# Patient Record
Sex: Female | Born: 1950 | Race: White | Hispanic: No | Smoking: Former smoker
Health system: Southern US, Community
[De-identification: ages and names within clinical notes are randomized; demographics above are authoritative.]

## PROBLEM LIST (undated history)

## (undated) ENCOUNTER — Ambulatory Visit (HOSPITAL_COMMUNITY): Admission: EM | Payer: PPO

## (undated) DIAGNOSIS — E039 Hypothyroidism, unspecified: Secondary | ICD-10-CM

## (undated) DIAGNOSIS — M1990 Unspecified osteoarthritis, unspecified site: Secondary | ICD-10-CM

## (undated) DIAGNOSIS — I2510 Atherosclerotic heart disease of native coronary artery without angina pectoris: Secondary | ICD-10-CM

## (undated) DIAGNOSIS — M65312 Trigger thumb, left thumb: Secondary | ICD-10-CM

## (undated) DIAGNOSIS — I10 Essential (primary) hypertension: Secondary | ICD-10-CM

## (undated) DIAGNOSIS — G4730 Sleep apnea, unspecified: Secondary | ICD-10-CM

## (undated) DIAGNOSIS — I6529 Occlusion and stenosis of unspecified carotid artery: Secondary | ICD-10-CM

## (undated) DIAGNOSIS — E785 Hyperlipidemia, unspecified: Secondary | ICD-10-CM

## (undated) DIAGNOSIS — Z9889 Other specified postprocedural states: Secondary | ICD-10-CM

## (undated) HISTORY — PX: TUBAL LIGATION: SHX77

## (undated) HISTORY — PX: CARDIAC CATHETERIZATION: SHX172

---
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
## 1983-04-22 HISTORY — PX: DILATION AND CURETTAGE OF UTERUS: SHX78

## 1994-04-21 HISTORY — PX: CORONARY ARTERY BYPASS GRAFT: SHX141

## 1998-06-20 ENCOUNTER — Inpatient Hospital Stay (HOSPITAL_COMMUNITY): Admission: EM | Admit: 1998-06-20 | Discharge: 1998-06-21 | Payer: Self-pay | Admitting: Emergency Medicine

## 1998-06-20 ENCOUNTER — Encounter: Payer: Self-pay | Admitting: Emergency Medicine

## 1999-11-29 ENCOUNTER — Encounter: Admission: RE | Admit: 1999-11-29 | Discharge: 1999-11-29 | Payer: Self-pay | Admitting: Internal Medicine

## 1999-11-29 ENCOUNTER — Encounter: Payer: Self-pay | Admitting: Internal Medicine

## 2000-01-30 ENCOUNTER — Ambulatory Visit (HOSPITAL_COMMUNITY): Admission: RE | Admit: 2000-01-30 | Discharge: 2000-01-30 | Payer: Self-pay | Admitting: Interventional Cardiology

## 2000-12-02 ENCOUNTER — Encounter: Admission: RE | Admit: 2000-12-02 | Discharge: 2000-12-02 | Payer: Self-pay | Admitting: Internal Medicine

## 2000-12-02 ENCOUNTER — Encounter: Payer: Self-pay | Admitting: Internal Medicine

## 2002-12-31 ENCOUNTER — Ambulatory Visit (HOSPITAL_BASED_OUTPATIENT_CLINIC_OR_DEPARTMENT_OTHER): Admission: RE | Admit: 2002-12-31 | Discharge: 2002-12-31 | Payer: Self-pay | Admitting: Interventional Cardiology

## 2003-08-03 ENCOUNTER — Encounter: Admission: RE | Admit: 2003-08-03 | Discharge: 2003-08-03 | Payer: Self-pay | Admitting: Internal Medicine

## 2004-08-21 ENCOUNTER — Other Ambulatory Visit: Admission: RE | Admit: 2004-08-21 | Discharge: 2004-08-21 | Payer: Self-pay | Admitting: Internal Medicine

## 2004-10-03 ENCOUNTER — Encounter: Admission: RE | Admit: 2004-10-03 | Discharge: 2004-10-03 | Payer: Self-pay | Admitting: Internal Medicine

## 2005-09-18 ENCOUNTER — Ambulatory Visit: Payer: Self-pay | Admitting: Internal Medicine

## 2005-12-24 ENCOUNTER — Other Ambulatory Visit: Admission: RE | Admit: 2005-12-24 | Discharge: 2005-12-24 | Payer: Self-pay | Admitting: Internal Medicine

## 2006-03-20 ENCOUNTER — Encounter: Admission: RE | Admit: 2006-03-20 | Discharge: 2006-03-20 | Payer: Self-pay | Admitting: Internal Medicine

## 2006-04-21 HISTORY — PX: NASAL SEPTOPLASTY W/ TURBINOPLASTY: SHX2070

## 2006-10-30 ENCOUNTER — Ambulatory Visit: Payer: Self-pay | Admitting: Internal Medicine

## 2006-12-02 ENCOUNTER — Ambulatory Visit: Payer: Self-pay | Admitting: Internal Medicine

## 2007-01-08 ENCOUNTER — Ambulatory Visit (HOSPITAL_COMMUNITY): Admission: RE | Admit: 2007-01-08 | Discharge: 2007-01-08 | Payer: Self-pay | Admitting: Otolaryngology

## 2007-01-29 DIAGNOSIS — J310 Chronic rhinitis: Secondary | ICD-10-CM | POA: Insufficient documentation

## 2007-01-29 DIAGNOSIS — I25708 Atherosclerosis of coronary artery bypass graft(s), unspecified, with other forms of angina pectoris: Secondary | ICD-10-CM | POA: Insufficient documentation

## 2007-01-29 DIAGNOSIS — I10 Essential (primary) hypertension: Secondary | ICD-10-CM | POA: Insufficient documentation

## 2007-01-29 DIAGNOSIS — F17200 Nicotine dependence, unspecified, uncomplicated: Secondary | ICD-10-CM | POA: Insufficient documentation

## 2007-01-29 DIAGNOSIS — E785 Hyperlipidemia, unspecified: Secondary | ICD-10-CM | POA: Insufficient documentation

## 2007-01-29 DIAGNOSIS — G4733 Obstructive sleep apnea (adult) (pediatric): Secondary | ICD-10-CM | POA: Insufficient documentation

## 2007-04-02 ENCOUNTER — Encounter: Payer: Self-pay | Admitting: Internal Medicine

## 2008-01-12 ENCOUNTER — Other Ambulatory Visit: Admission: RE | Admit: 2008-01-12 | Discharge: 2008-01-12 | Payer: Self-pay | Admitting: Internal Medicine

## 2008-10-04 ENCOUNTER — Inpatient Hospital Stay (HOSPITAL_COMMUNITY): Admission: EM | Admit: 2008-10-04 | Discharge: 2008-10-05 | Payer: Self-pay | Admitting: Emergency Medicine

## 2009-01-16 ENCOUNTER — Other Ambulatory Visit: Admission: RE | Admit: 2009-01-16 | Discharge: 2009-01-16 | Payer: Self-pay | Admitting: Internal Medicine

## 2010-01-24 ENCOUNTER — Other Ambulatory Visit: Admission: RE | Admit: 2010-01-24 | Discharge: 2010-01-24 | Payer: Self-pay | Admitting: Internal Medicine

## 2010-05-11 ENCOUNTER — Encounter: Payer: Self-pay | Admitting: Internal Medicine

## 2010-05-12 ENCOUNTER — Encounter: Payer: Self-pay | Admitting: Internal Medicine

## 2010-07-01 ENCOUNTER — Other Ambulatory Visit: Payer: Self-pay | Admitting: Gastroenterology

## 2010-07-29 LAB — CBC
HCT: 44 % (ref 36.0–46.0)
MCHC: 33.5 g/dL (ref 30.0–36.0)
MCHC: 34.8 g/dL (ref 30.0–36.0)
MCV: 87.4 fL (ref 78.0–100.0)
MCV: 87.5 fL (ref 78.0–100.0)
Platelets: 285 10*3/uL (ref 150–400)
RBC: 5.02 MIL/uL (ref 3.87–5.11)
RBC: 5.08 MIL/uL (ref 3.87–5.11)
RDW: 13.5 % (ref 11.5–15.5)
RDW: 13.7 % (ref 11.5–15.5)

## 2010-07-29 LAB — LIPID PANEL
Cholesterol: 154 mg/dL (ref 0–200)
Total CHOL/HDL Ratio: 6.2 RATIO
VLDL: 65 mg/dL — ABNORMAL HIGH (ref 0–40)

## 2010-07-29 LAB — DIFFERENTIAL
Basophils Absolute: 0.1 10*3/uL (ref 0.0–0.1)
Basophils Relative: 1 % (ref 0–1)
Eosinophils Absolute: 0.1 10*3/uL (ref 0.0–0.7)
Eosinophils Relative: 1 % (ref 0–5)
Lymphs Abs: 3.6 10*3/uL (ref 0.7–4.0)
Neutrophils Relative %: 46 % (ref 43–77)

## 2010-07-29 LAB — TSH: TSH: 4.102 u[IU]/mL (ref 0.350–4.500)

## 2010-07-29 LAB — COMPREHENSIVE METABOLIC PANEL
ALT: 26 U/L (ref 0–35)
AST: 26 U/L (ref 0–37)
Alkaline Phosphatase: 91 U/L (ref 39–117)
CO2: 26 mEq/L (ref 19–32)
Chloride: 107 mEq/L (ref 96–112)
GFR calc Af Amer: >60 mL/min (ref >60)
GFR calc non Af Amer: >60 mL/min (ref >60)
Glucose, Bld: 95 mg/dL (ref 70–99)
Potassium: 4.1 mEq/L (ref 3.5–5.1)
Sodium: 142 mEq/L (ref 135–145)
Total Bilirubin: 0.7 mg/dL (ref 0.3–1.2)

## 2010-07-29 LAB — CK TOTAL AND CKMB (NOT AT ARMC): CK, MB: 1.3 ng/mL (ref 0.3–4.0)

## 2010-07-29 LAB — CARDIAC PANEL(CRET KIN+CKTOT+MB+TROPI)
CK, MB: 1.2 ng/mL (ref 0.3–4.0)
Relative Index: INVALID (ref 0.0–2.5)
Relative Index: INVALID (ref 0.0–2.5)
Total CK: 65 U/L (ref 7–177)

## 2010-07-29 LAB — PROTIME-INR: Prothrombin Time: 12.8 seconds (ref 11.6–15.2)

## 2010-09-03 NOTE — Assessment & Plan Note (Signed)
[facility]                             PULMONARY OFFICE NOTE   Kathryn Castillo, Kathryn Castillo                      MRN:          [id number]  DATE:10/30/2006                            DOB:          [birth year]    PULMONARY FOLLOWUP.   PROBLEM:  1. Obstructive sleep apnea.  2. Coronary disease/bypass graft.  3. Tobacco use.   HISTORY:  One year followup.  She continues to smoke now about a pack a  day.  She had been seen by Dr. Willa Rough for allergy evaluation and had skin  testing but understood that significant allergic triggers were not  identified.  She complains her nose drips all the time and feels  congested, with nothing purulent and no significant headache.  She uses  Nasonex, but I think not very regularly, and she finds it only temporary  help.  She remembers having tried Astelin in the past.  She had had  Chantix in the past.  She complains now that her CPAP pressure is not  high enough, noted especially since she got a new machine.  She had had  a titration effort last year and it looks as if that may have  recommended a pressure around 10 but she thinks she has been on 17 in  the past and that she is just not getting enough air pressure now to be  comfortable.  We discussed this.   MEDICATION:  CPAP, atenolol, Vytorin, Baby Aspirin, Fosamax, Caltrate,  Fish Oil.   DRUG INTOLERANCE:  Septra.   OBJECTIVE:  Weight 220 pounds, BP 106/60, pulse 60, room air saturation  99%.  Moderate nasal congestion, turbinate edema, clear secretions, no  visible polyps, sneezing and watery sniffing.  Pharynx looks clear,  there is no postnasal drainage, voice quality is normal.  LUNGS:  Clear without cough or wheeze now.  HEART SOUNDS:  Regular, without murmur.   IMPRESSION:  1. Rhinitis.  2. Obstructive sleep apnea needing pressure elevation, she thinks to      17.  3. Tobacco abuse.   PLAN:  1. Weight loss.  2. Quit smoking.  3. Sample of Veramyst, two  sprays to each nostril daily.  4. Sudafed PE once or twice daily for congestion.  5. She is given nebulizer, Neo-Synephrine and Depo 80.  6. APREA is going to increase her CPAP pressure up to 17, which is      where she thinks it is      supposed to be.  7. Schedule return in 1 month, earlier p.r.n.     Clinton D. Maple Hudson, MD, Tonny Bollman, FACP  Electronically Signed    CDY/MedQ  DD: 10/30/2006  DT: 11/01/2006  Job #: [id number]   cc:   Candyce Churn, M.D.  Lyn Records, M.D.

## 2010-09-03 NOTE — Op Note (Signed)
Kathryn Castillo, Kathryn Castillo               ACCOUNT NO.:  [id number]   MEDICAL RECORD NO.:  [id number]          PATIENT TYPE:  AMB   LOCATION:  SDS                          FACILITY:  [facility]   PHYSICIAN:  Jefry H. Pollyann Kennedy, MD     DATE OF BIRTH:  April 22, 1950   DATE OF PROCEDURE:  01/08/2007  DATE OF DISCHARGE:                               OPERATIVE REPORT   PREOPERATIVE DIAGNOSIS:  1. Obstructive sleep apnea syndrome.  2. Inability to tolerate CPAP.  3. Nasal septal deviation with inferior turbinate hypertrophy.   POSTOPERATIVE DIAGNOSES:  1. Obstructive sleep apnea syndrome.  2. Inability to tolerate CPAP.  3. Nasal septal deviation with inferior turbinate hypertrophy.   PROCEDURE:  1. Nasal septoplasty.  2. Submucous resection of inferior turbinates bilaterally.   SURGEON:  Jefry H. Pollyann Kennedy, M.D.   General endotracheal anesthesia was used.   No complications.   BLOOD LOSS:  Minimal.   FINDINGS:  Bilateral septal deviation with partial obstruction of the  nasal cavities.  Significant bony hypertrophy of the inferior  turbinates.   HISTORY:  A 60 year old lady with a long history of sleep apnea.  She  has been unable to tolerate CPAP because of nasal obstruction.  Risks,  benefits, alternatives, complications of the procedure were explained to  the patient, seemed to understand and agreed to surgery.   PROCEDURE:  The patient was taken to the operating room, placed on the  operating room table in the supine position.  Following induction of  general endotracheal anesthesia, the patient was prepped and draped in  the standard fashion.  Oxymetazoline spray was used preoperatively in  the nasal cavities.  Xylocaine 1% with epinephrine was infiltrated into  the septum, the columella and the inferior turbinates.  Afrin soaked  pledgets were then placed bilaterally in the nasal cavities.   1. Nasal septoplasty:  A left hemitransfixion incision was created and      the caudal elevator was  used to elevate mucoperichondrial flap down      the left side of the nose.  The bone and cartilaginous junction was      divided and a similar flap was developed down the right side.      There were multiple deflections in the ethmoid plate and the vomer      causing large spurs bilaterally.  These were all taken down using      Jansen-Middleton rongeurs, removing the majority of the ethmoid      plate and the vomerian bone.  The cartilage was in good position      without any deflection.  The septal incision was reapproximated      with chromic suture.  The flaps were quilted with plain gut.  2. Submucous resection of inferior turbinates:  The leading edge of      the inferior turbinates was incised in a vertical fashion with the      15 scalpel.  A caudal elevator was used to elevate mucosa off bone.      Bony fragments were carefully removed and the turbinate remnants  were outfractured with the caudal elevator.  The nasal airways were      greatly increased bilaterally.  The nasal cavities were suctioned      of blood and secretions and packed with rolled up Telfa covered      with bacitracin ointment.  The pharynx was suctioned of blood and      secretions as well.  The patient was then awakened, extubated and      transferred to recovery in stable condition.      Jefry H. Pollyann Kennedy, MD  Electronically Signed     JHR/MEDQ  D:  01/08/2007  T:  01/08/2007  Job:  [id number]

## 2010-09-03 NOTE — Cardiovascular Report (Signed)
NAMEVIDA, Kathryn Castillo NO.:  [id number]   MEDICAL RECORD NO.:  [id number]          PATIENT TYPE:  INP   LOCATION:  3315                         FACILITY:  [facility]   PHYSICIAN:  Lyn Records, M.D.   DATE OF BIRTH:  07/11/1950   DATE OF PROCEDURE:  10/05/2008  DATE OF DISCHARGE:                            CARDIAC CATHETERIZATION   PROCEDURES PERFORMED:  1. Left heart catheterization.  2. Selective coronary angiogram.  3. Left ventriculography.  4. Left internal mammary graft angiography.   INDICATIONS:  The patient has a history of prior left internal mammary  artery graft to the LAD following restenosis for ostial LAD stenting.  This was greater than 8 years ago.  Recently, she has had recurring  episodes of chest pain predominately at rest.  She was admitted and is  ruled out for myocardial infarction.  No EKG changes have been noted.  Some discussion was held about objective evaluation.  The patient  strongly preferred to have coronary angiography to define anatomy rather  than a stress test.  She felt that she would not be able to exercise  because of her continued smoking and shortness of breath with exertion.   DESCRIPTION:  After informed consent, a 5-French sheath was placed in  the right femoral artery using the modified Seldinger technique.  A 5-  [place] A2 multipurpose catheter was then used for hemodynamic  recordings, left ventriculography by hand injection, and selective left  and right coronary angiography.  A 5-French internal mammary catheter  was used for left internal mammary artery angiography.  The patient  tolerated the procedure without complications.   RESULTS:  1. Hemodynamic data:      a.     Aortic pressure 128/59.      b.     Left ventricular pressure 131/8 mmHg.  2. Left ventriculography:  LV cavity size and function are normal.  3. Coronary angiography.      a.     Left main coronary:  Left main coronary artery is widely  patent and normal.      b.     Left anterior descending coronary:  The LAD is totally       occluded at the ostium from the left main.      c.     Circumflex artery:  Large ramus branch arises from distal       left main.  This has also been followed by a bifurcating first       obtuse marginal branch.  Minimal luminal irregularities noted in       the circumflex.      d.     Right coronary:  The right coronary is dominant.  It gives       PDA and left ventricular branches.  This vessel was normal and       widely patent.  4. Left internal mammary to the LAD:  The mammary to the LAD is widely      patent.  The LAD wraps around the left ventricular apex and extends      antegrade from  the graft insertion site to the proximal anterior      wall.  No significant obstructive lesions were noted.   CONCLUSIONS:  1. Widely patent left internal mammary graft to the left anterior      descending.  2. Totally occluded native left anterior descending.  3. Widely patent circumflex, ramus intermedius, and right coronary      artery.  4. Successful Angio-Seal with good hemostasis.   PLAN:  Discharge later today.  No change in medical regimen.  Further  evaluation should include the possibilities of musculoskeletal pain and  GI pain.  Anxiety stress disorder would be a diagnosis of exclusion.      Lyn Records, M.D.  Electronically Signed     HWS/MEDQ  D:  10/05/2008  T:  10/06/2008  Job:  [id number]   cc:   Candyce Churn, M.D.

## 2010-09-03 NOTE — Assessment & Plan Note (Signed)
[facility]                             PULMONARY OFFICE NOTE   Kathryn Castillo, Kathryn Castillo                      MRN:          [id number]  DATE:12/02/2006                            DOB:          November 08, 1950    PULMONARY FOLLOW-UP:   PROBLEMS:  1. Obstructive sleep apnea.  2. Coronary disease/bypass graft.  3. Tobacco use.  4. Rhinitis.   HISTORY:  CPAP is at 17 CWP through [place], and she thinks this pressure  is comfortable.  She cannot breathe through her nose.  Trying different  CPAP masks has not affected that.  She says no medications helped her  nose, including decongestants.  She still feels obstructed with some  watery rhinorrhea.  She has history of deviated septum and had  previously seen Dr. Pollyann Kennedy about an ear problem.  She cannot sit quietly  and breathe comfortably through her nose with her mouth closed.  We  talked about this as a significant issue related to her sleep problems.   MEDICATIONS:  1. CPAP at 17 CWP.  2. Atenolol.  3. Vytorin.  4. Aspirin 81 mg.  5. Vitamins.  6. Fosamax.  7. Fish oil.   Drug-intolerant to sulfa.   OBJECTIVE:  Weight 216 pounds, BP 122/70, pulse 70, room air saturation  98%.  There is moderate turbinate edema.  No mucous bridging or polyps.  Her  pharynx is clear.  Voice quality is normal.  Lung fields are clear.  Heart sounds regular without murmur.  No adenopathy.  No edema.   IMPRESSION:  1. Rhinitis with history of septal deviation.  2. Obstructive sleep apnea, trying to get comfortable with CPAP.  3. Tobacco abuse.   PLAN:  1. Smoking cessation discussed.  2. Patanase sample once each nostril daily.  3. ENT referral back to Dr. Pollyann Kennedy now specifically to evaluate for      mechanical nasal obstruction that may be aggravating sleep apnea.      She will continue CPAP for now and schedule return 4 months,      earlier p.r.n.     Clinton D. Maple Hudson, MD, Tonny Bollman, FACP  Electronically  Signed    CDY/MedQ  DD: 12/02/2006  DT: 12/04/2006  Job #: [id number]   cc:   Lyn Records, M.D.  Candyce Churn, M.D.

## 2010-09-06 NOTE — H&P (Signed)
[place]  Patient:    Kathryn Castillo, Kathryn Castillo                      MRN: [id number] Adm. Date:  [id number] Disc. Date: [id number] Attending:  Lyn Records. Iii Dictator:   Anselm Lis, N.P.                         History and Physical  PRIMARY CARE [place]:  Dr. Modesta Messing.  DATE OF BIRTH:  10-31-1950.  SUBJECTIVE:  Kathryn Castillo is a very pleasant 60 year old female who is status post CABG x 1 (LIMA to LAD) approximately five years earlier.  She had a repeat heart catheterization, March of last year, which revealed patent LIMA, though 100% native LAD; native CFX and RCA were okay.  She now presents for followup cardiac catheterization towards completion of her participation in the REVERSAL antihyperlipidemic trial.  She has been without anginal nor congestive heart failure complaints.  PREVIOUS MEDICAL HISTORY  1. Coronary arteriosclerotic heart disease:     A. (June 20, 1999) Cardiac catheterization revealing normal LV, patent        LIMA, 100% native LAD but CFX and RCA okay.     B. (May 1996) CABG x 1 with LIMA to the LAD by Dr. Delsa Grana. [place].     C. (February 1996) Prior to bypass surgery, PTCA/stent, proximal LAD.  2. Hyperlipidemia; current participant in the REVERSAL Trial through [employer].  3. Tobacco use; ongoing.  4. Obesity.  5. Perineal furunculosis.  6. History of incomplete right bundle branch block.  Patient denies history of diabetes mellitus, arthritis nor cancer.  PREVIOUS SURGICAL HISTORY:  Left knee surgery x 2 in 1968; CABG; bilateral tubal ligation in 1987.  ALLERGIES:  PENICILLIN; no problems with seafood, shellfish or iodinated products.  MEDICATIONS  A. Calcium 1200 mg p.o. q.d.  B. Atenolol 100 mg p.o. q.d.  C. Enteric-coated aspirin 325 mg once daily.  D. Ativan 0.5 mg p.r.n.  E. Multivitamin q.d.  F. Nitroglycerin p.r.n., none taken.  G. Keflex 500 mg Monday and Friday as part of  treatment for recurrent     furunculosis; this is ongoing therapy.  H. Study drug as part of REVERSAL Trial.  Patient to follow up by     October 20th.  SOCIAL HISTORY AND HABITS:  Tobacco use:  Has smoked as much as one to two packs per day, now down to a half-pack a day, onset age 39.  ETOH:  Not excessive.  Caffeine:  Not excessive.  Patient is divorced x 2.  Her 30 year old daughter resides with her and she is a Water engineer on [employer] and is going to school for nursing.  Patient works at [employer] in [place].  FAMILY HISTORY:  Father had PTCA x 2, age 18s.  Mother age 73, without CAD. No siblings.  REVIEW OF SYSTEMS:  As in HPI, surgical history and previous medical history; otherwise, denies problems with lightheadedness, dizziness, syncope nor near-syncopal episodes.  No problems with her hearing.  Does wear eye glasses. Has fair dentition.  Negative dysphagia to food or fluid.  Denies constipation, diarrhea, melena nor bright red blood pr.  Negative dysuria nor hematuria.  Negative pedal edema, orthopnea nor PND.  PHYSICAL EXAMINATION (Physical exam as performed by Dr. Verdis Prime.)  VITAL SIGNS:  Blood pressure 122/68, respiratory rate 18,  heart rate 77, temperature 97.6.  Height 5 feet 3 inches.  Weight 204 pounds.  GENERAL:  She is an overweight, pleasant and conversant 60 year old female in no acute distress.  HEENT/NECK:  Brisk bilateral carotid upstrokes without bruit.  No significant JVD nor thyromegaly.  CARDIAC:  Regular rate and rhythm, without murmur, rub nor gallop.  Normal S1 and S2.  CHEST:  Lung sounds clear with equal bilateral excursion.  Negative CVA tenderness.  ABDOMEN:  Soft, obese, normoactive bowel sounds.  Negative abdominal aortic, renal or femoral bruit.  Nontender to palpation.  No masses nor organomegaly appreciated, though difficult exam secondary to obesity.  EXTREMITIES:  Distal pulses intact; lower extremities are puffy  but nonpitting.  GENITAL:  Deferred.  RECTAL:  Deferred.  NEUROLOGIC:  Cranial nerves II-XII grossly intact; alert and oriented x 3.  LABORATORY TESTS AND DATA:  Sodium 139, potassium 4.8, chloride 102, CO2 of 30, BUN of 13, creatinine 1.0 and glucose of 101.  LFTs within normal range. WBC of 9.1 with hemoglobin 15.4 and platelets of 382,000.  Chest x-ray from January 21, 2000 reveals no active disease; borderline cardiac enlargement.  PT of 11.3, INR of 0.93 and PTT of 34.  EKG reveals NSR at 62 beats per minute without ischemic changes.  Some T wave flattening, V3.  IMPRESSION (As dictated by Dr. Katrinka Blazing.)  1. Followup cardiac catheterization as part of criteria for enrollment in     REVERSAL Trial via [facility] in this 60 year old obese female with     ongoing tobacco use, who has been without anginal nor congestive heart     failure complaints.  She is status post coronary artery bypass graft,     single vessel, left internal mammary artery to the left anterior     descending, May 1996.  She had a followup heart catheterization, March of     last year, for chest discomfort, revealing patent left internal mammary     artery but circumflex and right coronary artery native arteries okay.  2. Ongoing tobacco abuse.  3. Obesity.  4. Hyperlipidemia; current enrollment in [facility] trial.  PLAN:  (As dictated per Dr. Katrinka Blazing.)  Patient will undergo coronary angiography by Dr. Darci Needle this morning.  Risks, potential complications, benefits and alternatives to procedure are discussed in detail.  Ms. Spoonemore indicates her questions and concerns have been addressed and is agreeable to proceed. DD:  01/30/00 TD:  01/30/00 Job: [id number]

## 2010-09-06 NOTE — Cardiovascular Report (Signed)
[place]  Patient:    Kathryn Castillo, Kathryn Castillo                      MRN: [id number] Proc. Date: 01/30/00 Adm. Date:  [id number] Disc. Date: [id number] Attending:  Lyn Records. Iii CC:         [facility]  Cardiac Catheterization Lab   Cardiac Catheterization  INDICATIONS:  This is a followup study on the REVERSAL research trial for coronary atherosclerosis.  PROCEDURES PERFORMED: 1. Left heart catheterization. 2. Left ventriculography. 3. Selective coronary angiography. 4. Left internal mammary artery angiography. 5. Right coronary intravascular ultrasound.  DESCRIPTION OF PROCEDURE:  After informed consent, a 7-French sheath was inserted into the right femoral artery using the modified Seldinger technique. A 6-French A2 multipurpose catheter was used for hemodynamic recordings, left ventriculography by hand injection, and selective left and right coronary angiography.  A 6-French IMA catheter was used for left nonselective internal mammary artery angiography.  Following this, a 7-French sidehole #4 right Judkins guide catheter was used for obtaining guiding shots, the administration of 300 mcg of intracoronary nitroglycerin, and for intracoronary ultrasound recording.  The patient tolerated the procedure without complications.  The patient received a total of 3000 units of heparin prior to intravascular intervention.  ACT was documented to be greater than 300 seconds.  Following the procedure, Perclose was used for arteriotomy closure without complications.  RESULTS:   I. HEMODYNAMIC DATA:      a. Aortic pressure 157/87.      b. Left ventricular pressure 159/22.   II. LEFT VENTRICULOGRAPHY:  The left ventricle is normal in size and      demonstrates overall normal contractility.  Ejection fraction is      estimated to be 60%.  III. SELECTIVE CORONARY ANGIOGRAPHY:      a. Left main coronary:  Normal.      b. Left anterior  descending coronary:  Totally occluded at the ostium of         the origin from the left main.      c. Circumflex artery:  The circumflex is a small artery that bifurcates         into two small branches on the left lateral wall and is normal.      d. Ramus intermedius branch:  A large ramus intermedius arises from the         distal left main and is free of any significant obstruction.      e. Right coronary artery:  The right coronary is a large dominant vessel         giving rise to a PDA and several small left ventricular branches.         The vessel is tortuous in its mid segment.  No significant coronary         artery disease is noted.   IV. CORONARY ARTERY BYPASS GRAFT:  The left internal mammary artery graft      was not selectively engaged.  This artery was documented to be widely      patent by a nonselective injection into the distal third of the      subclavian.  Flow into the LAD was brisk.  The LAD itself was noted to      be mildly diffusely diseased, but there was good brisk flow into the      LAD system.    V. INTRAVASCULAR ULTRASOUND:  Intravascular ultrasound was performed on  the      right coronary starting just proximal to the PDA, with recording back to      the guide catheter in the very proximal part of the right coronary.      Minimal plaquing was noted in the right coronary in the proximal      segment.  CONCLUSIONS: 1. Totally occluded left anterior descending artery, chronic. 2. Normal right coronary artery, circumflex, and ramus intermedius branch    coronary arteries. 3. Normal left ventricular function. 3. Intravascular ultrasound on the right coronary artery demonstrating    minimal plaquing in the proximal right coronary artery.  PLAN:  Continue antilipid therapy.  The patient will roll out of the REVERSAL trial and we will resume Pravachol, which was her antilipid therapy prior to starting the REVERSAL trial. DD:  01/30/00 TD:  01/30/00 Job:  [id number]

## 2011-01-30 LAB — BASIC METABOLIC PANEL WITH GFR
BUN: 11
CO2: 27
Calcium: 10.2
Chloride: 102
Creatinine, Ser: 0.6
GFR calc non Af Amer: >60
Glucose, Bld: 101 — ABNORMAL HIGH
Potassium: 5
Sodium: 138

## 2011-01-30 LAB — CBC
HCT: 44.9
Hemoglobin: 15.5 — ABNORMAL HIGH
WBC: 9.6

## 2011-05-28 ENCOUNTER — Other Ambulatory Visit: Payer: Self-pay | Admitting: Internal Medicine

## 2011-05-28 DIAGNOSIS — Z1231 Encounter for screening mammogram for malignant neoplasm of breast: Secondary | ICD-10-CM

## 2011-06-06 ENCOUNTER — Ambulatory Visit: Payer: Self-pay

## 2011-06-26 ENCOUNTER — Ambulatory Visit
Admission: RE | Admit: 2011-06-26 | Discharge: 2011-06-26 | Disposition: A | Discharge status: Home / Self Care | Payer: BC Managed Care – PPO | Source: Ambulatory Visit | Attending: Internal Medicine | Admitting: Internal Medicine

## 2011-06-26 DIAGNOSIS — Z1231 Encounter for screening mammogram for malignant neoplasm of breast: Secondary | ICD-10-CM

## 2011-12-16 ENCOUNTER — Other Ambulatory Visit (HOSPITAL_COMMUNITY)
Admission: RE | Admit: 2011-12-16 | Discharge: 2011-12-16 | Disposition: A | Discharge status: Home / Self Care | Payer: BC Managed Care – PPO | Source: Ambulatory Visit | Attending: Internal Medicine | Admitting: Internal Medicine

## 2011-12-16 ENCOUNTER — Other Ambulatory Visit: Payer: Self-pay | Admitting: Internal Medicine

## 2011-12-16 DIAGNOSIS — Z01419 Encounter for gynecological examination (general) (routine) without abnormal findings: Secondary | ICD-10-CM | POA: Insufficient documentation

## 2012-09-16 ENCOUNTER — Encounter (HOSPITAL_BASED_OUTPATIENT_CLINIC_OR_DEPARTMENT_OTHER)
Admission: RE | Admit: 2012-09-16 | Discharge: 2012-09-16 | Disposition: A | Discharge status: Home / Self Care | Payer: BC Managed Care – PPO | Source: Ambulatory Visit | Attending: Orthopedic Surgery | Admitting: Orthopedic Surgery

## 2012-09-16 ENCOUNTER — Encounter (HOSPITAL_BASED_OUTPATIENT_CLINIC_OR_DEPARTMENT_OTHER): Payer: Self-pay | Admitting: *Deleted

## 2012-09-16 LAB — BASIC METABOLIC PANEL
BUN: 11 mg/dL (ref 6–23)
Calcium: 9.4 mg/dL (ref 8.4–10.5)
Chloride: 102 mEq/L (ref 96–112)
Creatinine, Ser: 0.86 mg/dL (ref 0.50–1.10)
GFR calc Af Amer: 82 mL/min — ABNORMAL LOW (ref >90)

## 2012-09-16 NOTE — Progress Notes (Signed)
To come in for ekg and bmet-had cabg x1 1996-last cath 2010-ok Has not had to see dr h Katrinka Blazing since then Sees pcp-[facility]. Uses a cpap evey night-to use post op-to bring meds and cpap dos

## 2012-09-17 ENCOUNTER — Encounter (HOSPITAL_BASED_OUTPATIENT_CLINIC_OR_DEPARTMENT_OTHER): Payer: Self-pay | Admitting: Anesthesiology

## 2012-09-17 ENCOUNTER — Encounter (HOSPITAL_BASED_OUTPATIENT_CLINIC_OR_DEPARTMENT_OTHER)
Admission: RE | Disposition: A | Discharge status: Home / Self Care | Payer: Self-pay | Source: Ambulatory Visit | Attending: Orthopedic Surgery

## 2012-09-17 ENCOUNTER — Ambulatory Visit (HOSPITAL_BASED_OUTPATIENT_CLINIC_OR_DEPARTMENT_OTHER): Payer: BC Managed Care – PPO | Admitting: Anesthesiology

## 2012-09-17 ENCOUNTER — Ambulatory Visit (HOSPITAL_BASED_OUTPATIENT_CLINIC_OR_DEPARTMENT_OTHER)
Admission: RE | Admit: 2012-09-17 | Discharge: 2012-09-17 | Disposition: A | Discharge status: Home / Self Care | Payer: BC Managed Care – PPO | Source: Ambulatory Visit | Attending: Orthopedic Surgery | Admitting: Orthopedic Surgery

## 2012-09-17 ENCOUNTER — Encounter (HOSPITAL_BASED_OUTPATIENT_CLINIC_OR_DEPARTMENT_OTHER): Payer: Self-pay | Admitting: Orthopedic Surgery

## 2012-09-17 DIAGNOSIS — M6530 Trigger finger, unspecified finger: Secondary | ICD-10-CM | POA: Insufficient documentation

## 2012-09-17 DIAGNOSIS — M65312 Trigger thumb, left thumb: Secondary | ICD-10-CM

## 2012-09-17 DIAGNOSIS — I2510 Atherosclerotic heart disease of native coronary artery without angina pectoris: Secondary | ICD-10-CM | POA: Insufficient documentation

## 2012-09-17 DIAGNOSIS — G4730 Sleep apnea, unspecified: Secondary | ICD-10-CM | POA: Insufficient documentation

## 2012-09-17 DIAGNOSIS — Z6838 Body mass index (BMI) 38.0-38.9, adult: Secondary | ICD-10-CM | POA: Insufficient documentation

## 2012-09-17 DIAGNOSIS — I10 Essential (primary) hypertension: Secondary | ICD-10-CM | POA: Insufficient documentation

## 2012-09-17 DIAGNOSIS — M129 Arthropathy, unspecified: Secondary | ICD-10-CM | POA: Insufficient documentation

## 2012-09-17 DIAGNOSIS — F17200 Nicotine dependence, unspecified, uncomplicated: Secondary | ICD-10-CM | POA: Insufficient documentation

## 2012-09-17 DIAGNOSIS — Z951 Presence of aortocoronary bypass graft: Secondary | ICD-10-CM | POA: Insufficient documentation

## 2012-09-17 HISTORY — PX: TRIGGER FINGER RELEASE: SHX641

## 2012-09-17 HISTORY — DX: Hyperlipidemia, unspecified: E78.5

## 2012-09-17 HISTORY — DX: Sleep apnea, unspecified: G47.30

## 2012-09-17 HISTORY — DX: Trigger thumb, left thumb: M65.312

## 2012-09-17 HISTORY — DX: Atherosclerotic heart disease of native coronary artery without angina pectoris: I25.10

## 2012-09-17 HISTORY — DX: Essential (primary) hypertension: I10

## 2012-09-17 HISTORY — DX: Unspecified osteoarthritis, unspecified site: M19.90

## 2012-09-17 SURGERY — RELEASE, A1 PULLEY, FOR TRIGGER FINGER
Anesthesia: General | Site: Thumb | Laterality: Left | Wound class: Clean

## 2012-09-17 MED ORDER — LIDOCAINE HCL (CARDIAC) 20 MG/ML IV SOLN
INTRAVENOUS | Status: DC | PRN
  Administered 2012-09-17: 60 mg via INTRAVENOUS

## 2012-09-17 MED ORDER — FENTANYL CITRATE 0.05 MG/ML IJ SOLN: 50.0000 ug | INTRAMUSCULAR | Status: DC | PRN

## 2012-09-17 MED ORDER — PROPOFOL 10 MG/ML IV BOLUS
INTRAVENOUS | Status: DC | PRN
  Administered 2012-09-17: 150 mg via INTRAVENOUS

## 2012-09-17 MED ORDER — LACTATED RINGERS IV SOLN
INTRAVENOUS | Status: DC
  Administered 2012-09-17: 09:00:00 via INTRAVENOUS

## 2012-09-17 MED ORDER — FENTANYL CITRATE 0.05 MG/ML IJ SOLN
INTRAMUSCULAR | Status: DC | PRN
  Administered 2012-09-17: 100 ug via INTRAVENOUS

## 2012-09-17 MED ORDER — OXYCODONE HCL 5 MG/5ML PO SOLN: 5.0000 mg | Freq: Once | ORAL | Status: DC | PRN

## 2012-09-17 MED ORDER — ONDANSETRON HCL 4 MG/2ML IJ SOLN: INTRAMUSCULAR | Status: DC | PRN

## 2012-09-17 MED ORDER — BUPIVACAINE HCL (PF) 0.5 % IJ SOLN
INTRAMUSCULAR | Status: DC | PRN
  Administered 2012-09-17: 3 mL

## 2012-09-17 MED ORDER — ONDANSETRON HCL 4 MG/2ML IJ SOLN
INTRAMUSCULAR | Status: DC | PRN
  Administered 2012-09-17: 4 mg via INTRAVENOUS

## 2012-09-17 MED ORDER — OXYCODONE-ACETAMINOPHEN 5-325 MG PO TABS: 1.0000 | ORAL_TABLET | Freq: Four times a day (QID) | ORAL | Status: DC | PRN

## 2012-09-17 MED ORDER — MIDAZOLAM HCL 2 MG/2ML IJ SOLN: 1.0000 mg | INTRAMUSCULAR | Status: DC | PRN

## 2012-09-17 MED ORDER — OXYCODONE HCL 5 MG PO TABS: 5.0000 mg | ORAL_TABLET | Freq: Once | ORAL | Status: DC | PRN

## 2012-09-17 MED ORDER — MIDAZOLAM HCL 5 MG/5ML IJ SOLN
INTRAMUSCULAR | Status: DC | PRN
  Administered 2012-09-17: 2 mg via INTRAVENOUS

## 2012-09-17 MED ORDER — HYDROMORPHONE HCL PF 1 MG/ML IJ SOLN: 0.2500 mg | INTRAMUSCULAR | Status: DC | PRN

## 2012-09-17 MED ORDER — DEXAMETHASONE SODIUM PHOSPHATE 10 MG/ML IJ SOLN
INTRAMUSCULAR | Status: DC | PRN
  Administered 2012-09-17: 4 mg via INTRAVENOUS

## 2012-09-17 SURGICAL SUPPLY — 41 items
BANDAGE ELASTIC 3 VELCRO ST LF (GAUZE/BANDAGES/DRESSINGS) ×2 IMPLANT
BLADE MINI RND TIP GREEN BEAV (BLADE) ×2 IMPLANT
BLADE SURG 15 STRL LF DISP TIS (BLADE) ×1 IMPLANT
BLADE SURG 15 STRL SS (BLADE) ×1
BNDG ESMARK 4X9 LF (GAUZE/BANDAGES/DRESSINGS) ×2 IMPLANT
CLOTH BEACON ORANGE TIMEOUT ST (SAFETY) ×2 IMPLANT
CORDS BIPOLAR (ELECTRODE) ×2 IMPLANT
COVER TABLE BACK 60X90 (DRAPES) ×2 IMPLANT
CUFF TOURNIQUET SINGLE 18IN (TOURNIQUET CUFF) ×2 IMPLANT
DRAPE EXTREMITY T 121X128X90 (DRAPE) ×2 IMPLANT
DRAPE SURG 17X23 STRL (DRAPES) ×2 IMPLANT
DRAPE U 20/CS (DRAPES) ×2 IMPLANT
DURAPREP 26ML APPLICATOR (WOUND CARE) ×2 IMPLANT
GAUZE XEROFORM 1X8 LF (GAUZE/BANDAGES/DRESSINGS) ×2 IMPLANT
GLOVE BIO SURGEON STRL SZ 6.5 (GLOVE) ×2 IMPLANT
GLOVE BIO SURGEON STRL SZ8 (GLOVE) ×2 IMPLANT
GLOVE BIOGEL PI IND STRL 8 (GLOVE) ×2 IMPLANT
GLOVE BIOGEL PI INDICATOR 8 (GLOVE) ×2
GLOVE ECLIPSE 6.5 STRL STRAW (GLOVE) ×2 IMPLANT
GLOVE INDICATOR 6.5 STRL GRN (GLOVE) ×2 IMPLANT
GLOVE ORTHO TXT STRL SZ7.5 (GLOVE) ×2 IMPLANT
GOWN BRE IMP PREV XXLGXLNG (GOWN DISPOSABLE) ×4 IMPLANT
GOWN PREVENTION PLUS XLARGE (GOWN DISPOSABLE) ×4 IMPLANT
NEEDLE HYPO 25X1 1.5 SAFETY (NEEDLE) ×2 IMPLANT
NS IRRIG 1000ML POUR BTL (IV SOLUTION) ×2 IMPLANT
PACK BASIN DAY SURGERY FS (CUSTOM PROCEDURE TRAY) ×2 IMPLANT
PAD CAST 3X4 CTTN HI CHSV (CAST SUPPLIES) ×1 IMPLANT
PADDING CAST ABS 3INX4YD NS (CAST SUPPLIES) ×1
PADDING CAST ABS 4INX4YD NS (CAST SUPPLIES) ×1
PADDING CAST ABS COTTON 3X4 (CAST SUPPLIES) ×1 IMPLANT
PADDING CAST ABS COTTON 4X4 ST (CAST SUPPLIES) ×1 IMPLANT
PADDING CAST COTTON 3X4 STRL (CAST SUPPLIES) ×1
SPLINT PLASTER CAST XFAST 3X15 (CAST SUPPLIES) IMPLANT
SPLINT PLASTER XTRA FASTSET 3X (CAST SUPPLIES)
SPONGE GAUZE 4X4 12PLY (GAUZE/BANDAGES/DRESSINGS) ×2 IMPLANT
STOCKINETTE 4X48 STRL (DRAPES) ×2 IMPLANT
SUT ETHILON 4 0 PS 2 18 (SUTURE) ×2 IMPLANT
SYR BULB 3OZ (MISCELLANEOUS) ×2 IMPLANT
SYR CONTROL 10ML LL (SYRINGE) ×2 IMPLANT
TOWEL OR 17X24 6PK STRL BLUE (TOWEL DISPOSABLE) ×2 IMPLANT
UNDERPAD 30X30 INCONTINENT (UNDERPADS AND DIAPERS) ×2 IMPLANT

## 2012-09-17 NOTE — Anesthesia Preprocedure Evaluation (Addendum)
Anesthesia Evaluation  Patient identified by MRN, date of birth, ID band Patient awake    Reviewed: Allergy & Precautions, H&P , NPO status , Patient's Chart, lab work & pertinent test results, reviewed documented beta blocker date and time   Airway Mallampati: III TM Distance: >3 FB Neck ROM: Full    Dental no notable dental hx. (+) Teeth Intact and Dental Advisory Given   Pulmonary sleep apnea and Continuous Positive Airway Pressure Ventilation ,  breath sounds clear to auscultation  Pulmonary exam normal       Cardiovascular hypertension, On Medications and On Home Beta Blockers + CAD Rhythm:Regular Rate:Normal     Neuro/Psych PSYCHIATRIC DISORDERS negative neurological ROS     GI/Hepatic negative GI ROS, Neg liver ROS,   Endo/Other  Morbid obesity  Renal/GU negative Renal ROS  negative genitourinary   Musculoskeletal   Abdominal   Peds  Hematology negative hematology ROS (+)   Anesthesia Other Findings   Reproductive/Obstetrics negative OB ROS                         Anesthesia Physical Anesthesia Plan  ASA: III  Anesthesia Plan: General   Post-op Pain Management:    Induction: Intravenous  Airway Management Planned: LMA  Additional Equipment:   Intra-op Plan:   Post-operative Plan: Extubation in OR  Informed Consent: I have reviewed the patients History and Physical, chart, labs and discussed the procedure including the risks, benefits and alternatives for the proposed anesthesia with the patient or authorized representative who has indicated his/her understanding and acceptance.   Dental advisory given  Plan Discussed with: CRNA  Anesthesia Plan Comments:         Anesthesia Quick Evaluation

## 2012-09-17 NOTE — Transfer of Care (Signed)
Immediate Anesthesia Transfer of Care Note  Patient: Kathryn Castillo  Procedure(s) Performed: Procedure(s): RELEASE TRIGGER FINGER/A-1 PULLEY LEFT THUMB (TENDON SHEATH INCISION) (Left)  Patient Location: PACU  Anesthesia Type:General  Level of Consciousness: sedated  Airway & Oxygen Therapy: Patient Spontanous Breathing and Patient connected to face mask oxygen  Post-op Assessment: Report given to PACU RN and Post -op Vital signs reviewed and stable  Post vital signs: Reviewed and stable  Complications: No apparent anesthesia complications

## 2012-09-17 NOTE — Op Note (Signed)
09/17/2012  10:06 AM  PATIENT:  Kathryn Castillo    PRE-OPERATIVE DIAGNOSIS:  LEFT THUMB TRIGGER FINGER  POST-OPERATIVE DIAGNOSIS:  Same  PROCEDURE:  RELEASE TRIGGER FINGER/A-1 PULLEY LEFT THUMB (TENDON SHEATH INCISION)  SURGEON:  Eulas Post, MD  PHYSICIAN ASSISTANT: Janace Litten, OPA-C, present and scrubbed throughout the case, critical for completion in a timely fashion, and for retraction, instrumentation, and closure.  ANESTHESIA:   General  PREOPERATIVE INDICATIONS:  Kathryn Castillo is a  62 y.o. female with a diagnosis of LEFT THUMB TRIGGER FINGER who failed conservative measures and elected for surgical management.    The risks benefits and alternatives were discussed with the patient preoperatively including but not limited to the risks of infection, bleeding, nerve injury, cardiopulmonary complications, the need for revision surgery, among others, and the patient was willing to proceed.  OPERATIVE IMPLANTS: None  OPERATIVE FINDINGS: Hypertrophic A1 pulley, small nodule on the flexor tendon  OPERATIVE PROCEDURE: The patient was brought to the operating room and placed in supine position. General anesthesia was administered. She did not require IV antibiotics, as this was a soft tissue only procedure, and the risks for reaction to the antibiotics outweigh the benefits.  Time out was performed. The left upper extremity was prepped and draped in usual sterile fashion. Volar incision was made over the flexor crease in the skin, only going through the skin. Dissection was carried down bluntly, mobilizing the digital nerve to the thumb. This was retracted, and protected throughout the case. I identified the A1 pulley, and incised this longitudinally. Complete release was achieved. This was confirmed both proximally and distally. I examined the tendon, and there was no damage, and the tendon was intact. There was a very small nodule, which I did not excise.  The wounds were  irrigated copiously, injected with Marcaine without epinephrine, and nylon sutures used for closure.  Sterile gauze was applied followed by a splint, and she was awakened and returned back in stable and satisfactory condition. There were no complications and she tolerated the procedure well.

## 2012-09-17 NOTE — H&P (Signed)
PREOPERATIVE H&P  Chief Complaint: LEFT THUMB TRIGGER FINGER  HPI: Kathryn Castillo is a 62 y.o. female who presents for preoperative history and physical with a diagnosis of LEFT THUMB TRIGGER FINGER. Symptoms are rated as moderate to severe, and have been worsening.  This is significantly impairing activities of daily living.  She has elected for surgical management. She's had injections and bracing, without significant relief. It pops daily.  Past Medical History  Diagnosis Date  . Hypertension   . Coronary artery disease   . Sleep apnea     uses a cpap  . Hyperlipemia   . Arthritis   . Wears glasses   . Wears partial dentures     upper partial  . Family history of anesthesia complication     father hallucinates   Past Surgical History  Procedure Laterality Date  . Dilation and curettage of uterus  1985  . Tubal ligation    . Knee arthroscopy with patella reconstruction  1968    left  . Coronary artery bypass graft  1996    x1  . Nasal septoplasty w/ turbinoplasty  2008    to help tolerated cpap  . Cardiac catheterization  96,01,10    x3   History   Social History  . Marital Status: Divorced    Spouse Name: N/A    Number of Children: N/A  . Years of Education: N/A   Social History Main Topics  . Smoking status: Current Every Day Smoker -- 1.00 packs/day  . Smokeless tobacco: None  . Alcohol Use: No  . Drug Use: No  . Sexually Active: None   Other Topics Concern  . None   Social History Narrative  . None   History reviewed. No pertinent family history. Allergies  Allergen Reactions  . Sulfamethoxazole W-Trimethoprim    Prior to Admission medications   Medication Sig Start Date End Date Taking? Authorizing Provider  aspirin 81 MG tablet Take 81 mg by mouth daily.   Yes Historical Provider, MD  atenolol (TENORMIN) 100 MG tablet Take 100 mg by mouth every evening.   Yes Historical Provider, MD  calcium carbonate (OS-CAL - DOSED IN MG OF ELEMENTAL  CALCIUM) 1250 MG tablet Take 1 tablet by mouth daily.   Yes Historical Provider, MD  cephALEXin (KEFLEX) 500 MG capsule Take 500 mg by mouth as needed (takes as needed for boils).   Yes Historical Provider, MD  cholecalciferol (VITAMIN D) 1000 UNITS tablet Take 1,000 Units by mouth daily.   Yes Historical Provider, MD  fish oil-omega-3 fatty acids 1000 MG capsule Take 1,200 mg by mouth daily.   Yes Historical Provider, MD  Multiple Vitamins-Minerals (MULTIVITAMIN WITH MINERALS) tablet Take 1 tablet by mouth daily.   Yes Historical Provider, MD  simvastatin (ZOCOR) 20 MG tablet Take 20 mg by mouth at bedtime.   Yes Historical Provider, MD     Positive ROS: All other systems have been reviewed and were otherwise negative with the exception of those mentioned in the HPI and as above.  Physical Exam: General: Alert, no acute distress Cardiovascular: No pedal edema Respiratory: No cyanosis, no use of accessory musculature GI: No organomegaly, abdomen is soft and non-tender Skin: No lesions in the area of chief complaint Neurologic: Sensation intact distally Psychiatric: Patient is competent for consent with normal mood and affect Lymphatic: No axillary or cervical lymphadenopathy  MUSCULOSKELETAL: Left thumb has a palpable nodule on the volar side, and triggers with motion.  Assessment: LEFT THUMB TRIGGER  FINGER  Plan: Plan for Procedure(s): RELEASE TRIGGER FINGER/A-1 PULLEY LEFT HUMB (TENDON SHEATH INCISION)  The risks benefits and alternatives were discussed with the patient including but not limited to the risks of nonoperative treatment, versus surgical intervention including infection, bleeding, nerve injury,  blood clots, cardiopulmonary complications, morbidity, mortality, among others, and they were willing to proceed.   Eulas Post, MD Cell [phone]   09/17/2012 7:19 AM

## 2012-09-17 NOTE — Anesthesia Procedure Notes (Signed)
Procedure Name: LMA Insertion Date/Time: 09/17/2012 9:39 AM Performed by: Burna Cash Pre-anesthesia Checklist: Patient identified, Emergency Drugs available, Suction available and Patient being monitored Patient Re-evaluated:Patient Re-evaluated prior to inductionOxygen Delivery Method: Circle System Utilized Preoxygenation: Pre-oxygenation with 100% oxygen Intubation Type: IV induction Ventilation: Mask ventilation without difficulty LMA: LMA inserted LMA Size: 4.0 Number of attempts: 1 Airway Equipment and Method: bite block Placement Confirmation: positive ETCO2 Tube secured with: Tape Dental Injury: Teeth and Oropharynx as per pre-operative assessment

## 2012-09-17 NOTE — Anesthesia Postprocedure Evaluation (Signed)
  Anesthesia Post-op Note  Patient: Kathryn Castillo  Procedure(s) Performed: Procedure(s): RELEASE TRIGGER FINGER/A-1 PULLEY LEFT THUMB (TENDON SHEATH INCISION) (Left)  Patient Location: PACU  Anesthesia Type:General  Level of Consciousness: awake, alert  and oriented  Airway and Oxygen Therapy: Patient Spontanous Breathing  Post-op Pain: mild  Post-op Assessment: Post-op Vital signs reviewed, Patient's Cardiovascular Status Stable, Respiratory Function Stable, Patent Airway and No signs of Nausea or vomiting  Post-op Vital Signs: Reviewed and stable  Complications: No apparent anesthesia complications

## 2012-09-20 LAB — POCT HEMOGLOBIN-HEMACUE: Hemoglobin: 15.5 g/dL — ABNORMAL HIGH (ref 12.0–15.0)

## 2013-08-13 ENCOUNTER — Encounter: Payer: Self-pay | Admitting: Interventional Cardiology

## 2013-09-05 ENCOUNTER — Ambulatory Visit: Payer: BC Managed Care – PPO | Admitting: Interventional Cardiology

## 2013-09-28 ENCOUNTER — Encounter: Payer: Self-pay | Admitting: Interventional Cardiology

## 2013-11-19 ENCOUNTER — Encounter: Payer: Self-pay | Admitting: Interventional Cardiology

## 2013-12-01 ENCOUNTER — Other Ambulatory Visit: Payer: Self-pay

## 2013-12-01 DIAGNOSIS — Z1231 Encounter for screening mammogram for malignant neoplasm of breast: Secondary | ICD-10-CM

## 2013-12-08 ENCOUNTER — Ambulatory Visit
Admission: RE | Admit: 2013-12-08 | Discharge: 2013-12-08 | Disposition: A | Discharge status: Home / Self Care | Payer: BC Managed Care – PPO | Source: Ambulatory Visit

## 2013-12-08 DIAGNOSIS — Z1231 Encounter for screening mammogram for malignant neoplasm of breast: Secondary | ICD-10-CM

## 2014-01-17 ENCOUNTER — Other Ambulatory Visit (HOSPITAL_COMMUNITY)
Admission: RE | Admit: 2014-01-17 | Discharge: 2014-01-17 | Disposition: A | Discharge status: Home / Self Care | Payer: BC Managed Care – PPO | Source: Ambulatory Visit | Attending: Internal Medicine | Admitting: Internal Medicine

## 2014-01-17 ENCOUNTER — Other Ambulatory Visit: Payer: Self-pay | Admitting: Internal Medicine

## 2014-01-17 DIAGNOSIS — Z01419 Encounter for gynecological examination (general) (routine) without abnormal findings: Secondary | ICD-10-CM | POA: Insufficient documentation

## 2014-01-17 DIAGNOSIS — Z1151 Encounter for screening for human papillomavirus (HPV): Secondary | ICD-10-CM | POA: Diagnosis present

## 2014-01-19 LAB — CYTOLOGY - PAP

## 2014-07-18 ENCOUNTER — Other Ambulatory Visit (HOSPITAL_COMMUNITY): Payer: Self-pay | Admitting: *Deleted

## 2014-07-18 DIAGNOSIS — R0989 Other specified symptoms and signs involving the circulatory and respiratory systems: Secondary | ICD-10-CM

## 2014-07-19 ENCOUNTER — Ambulatory Visit (HOSPITAL_COMMUNITY): Payer: BLUE CROSS/BLUE SHIELD | Attending: Cardiovascular Disease | Admitting: *Deleted

## 2014-07-19 DIAGNOSIS — R0989 Other specified symptoms and signs involving the circulatory and respiratory systems: Secondary | ICD-10-CM | POA: Diagnosis not present

## 2014-07-19 DIAGNOSIS — I6523 Occlusion and stenosis of bilateral carotid arteries: Secondary | ICD-10-CM | POA: Diagnosis not present

## 2014-07-19 NOTE — Progress Notes (Signed)
Carotid Duplex Scan Performed 

## 2014-09-12 ENCOUNTER — Encounter: Payer: Self-pay | Admitting: Interventional Cardiology

## 2014-09-12 ENCOUNTER — Ambulatory Visit (INDEPENDENT_AMBULATORY_CARE_PROVIDER_SITE_OTHER): Payer: BLUE CROSS/BLUE SHIELD | Admitting: Interventional Cardiology

## 2014-09-12 VITALS — BP 132/80 | HR 51 | Ht 63.0 in | Wt 237.0 lb

## 2014-09-12 DIAGNOSIS — E785 Hyperlipidemia, unspecified: Secondary | ICD-10-CM | POA: Diagnosis not present

## 2014-09-12 DIAGNOSIS — G4733 Obstructive sleep apnea (adult) (pediatric): Secondary | ICD-10-CM

## 2014-09-12 DIAGNOSIS — Z720 Tobacco use: Secondary | ICD-10-CM | POA: Diagnosis not present

## 2014-09-12 DIAGNOSIS — I25810 Atherosclerosis of coronary artery bypass graft(s) without angina pectoris: Secondary | ICD-10-CM | POA: Diagnosis not present

## 2014-09-12 DIAGNOSIS — I10 Essential (primary) hypertension: Secondary | ICD-10-CM | POA: Diagnosis not present

## 2014-09-12 DIAGNOSIS — I5032 Chronic diastolic (congestive) heart failure: Secondary | ICD-10-CM | POA: Diagnosis not present

## 2014-09-12 DIAGNOSIS — F17200 Nicotine dependence, unspecified, uncomplicated: Secondary | ICD-10-CM

## 2014-09-12 MED ORDER — ATENOLOL 50 MG PO TABS: 50.0000 mg | ORAL_TABLET | Freq: Every evening | ORAL | Status: DC

## 2014-09-12 MED ORDER — HYDROCHLOROTHIAZIDE 12.5 MG PO CAPS: 12.5000 mg | ORAL_CAPSULE | Freq: Every day | ORAL | Status: DC

## 2014-09-12 NOTE — Patient Instructions (Signed)
Medication Instructions:  Your physician has recommended you make the following change in your medication:  REDUCE Atenolol to 50mg  daily START HCTZ 12.5mg  daily. An Rx has been sent to your pharmacy  Labwork: Your physician recommends that you return for lab work in: 1 month (Bmet)  Testing/Procedures: Your physician has requested that you have an echocardiogram. Echocardiography is a painless test that uses sound waves to create images of your heart. It provides your doctor with information about the size and shape of your heart and how well your heart's chambers and valves are working. This procedure takes approximately one hour. There are no restrictions for this procedure.   Follow-Up: Your physician wants you to follow-up in: 1 year with Dr.Smith You will receive a reminder letter in the mail two months in advance. If you don't receive a letter, please call our office to schedule the follow-up appointment.   Any Other Special Instructions Will Be Listed Below (If Applicable).

## 2014-09-12 NOTE — Progress Notes (Signed)
Cardiology Office Note   Date:  09/12/2014   ID:  Kathryn Castillo, DOB 01/22/1951, MRN [id number]  PCP:  Kandice Hams, MD  Cardiologist:   Sinclair Grooms, MD   Chief Complaint  Patient presents with  . CORONARY ATHEROSCLEROSIS      History of Present Illness: Kathryn Castillo is a 64 y.o. female who presents for hypertension, chronic obstructive sleep apnea, tobacco use, coronary artery disease with LIMA to LAD after failed ostial LAD due to restenosis 1996.  Overall, the patient is doing well. She has not had syncope. There is some exertional dyspnea. There is excessive lower extremity swelling each evening. She has not been motivated to be physically active. She stopped smoking 4 weeks ago.  Past Medical History  Diagnosis Date  . Hypertension   . Coronary artery disease   . Sleep apnea     uses a cpap  . Hyperlipemia   . Arthritis   . Wears glasses   . Wears partial dentures     upper partial  . Family history of anesthesia complication     father hallucinates  . Trigger thumb of left hand 09/17/2012    Past Surgical History  Procedure Laterality Date  . Dilation and curettage of uterus  1985  . Tubal ligation    . Knee arthroscopy with patella reconstruction  1968    left  . Coronary artery bypass graft  1996    x1  . Nasal septoplasty w/ turbinoplasty  2008    to help tolerated cpap  . Cardiac catheterization  96,01,10    x3  . Trigger finger release Left 09/17/2012    Procedure: RELEASE TRIGGER FINGER/A-1 PULLEY LEFT THUMB (TENDON SHEATH INCISION);  Surgeon: Johnny Bridge, MD;  Location: [facility];  Service: Orthopedics;  Laterality: Left;     Current Outpatient Prescriptions  Medication Sig Dispense Refill  . aspirin 81 MG tablet Take 81 mg by mouth daily.    Marland Kitchen atenolol (TENORMIN) 50 MG tablet Take 1 tablet (50 mg total) by mouth every evening.    . calcium carbonate (OS-CAL - DOSED IN MG OF ELEMENTAL CALCIUM) 1250 MG tablet  Take 1 tablet by mouth daily.    . cephALEXin (KEFLEX) 500 MG capsule Take 500 mg by mouth as needed (takes as needed for boils).    . cholecalciferol (VITAMIN D) 1000 UNITS tablet Take 1,000 Units by mouth daily.    . fish oil-omega-3 fatty acids 1000 MG capsule Take 1,200 mg by mouth daily.    . Multiple Vitamins-Minerals (MULTIVITAMIN WITH MINERALS) tablet Take 1 tablet by mouth daily.    . simvastatin (ZOCOR) 20 MG tablet Take 20 mg by mouth at bedtime.    . hydrochlorothiazide (MICROZIDE) 12.5 MG capsule Take 1 capsule (12.5 mg total) by mouth daily. 30 capsule 11   No current facility-administered medications for this visit.    Allergies:   Sulfamethoxazole-trimethoprim    Social History:  The patient  reports that she quit smoking about 6 weeks ago. She does not have any smokeless tobacco history on file. She reports that she does not drink alcohol or use illicit drugs.   Family History:  The patient's family history includes Heart attack in her father; Heart disease in her father; Hyperlipidemia in her father; Hypertension in her father; Stroke in her father and mother.    ROS:  Please see the history of present illness.   Otherwise, review of systems are positive for  weight gain and lethargy.   All other systems are reviewed and negative.    PHYSICAL EXAM: VS:  BP 132/80 mmHg  Pulse 51  Ht 5\' 3"  (1.6 m)  Wt 237 lb (107.502 kg)  BMI 41.99 kg/m2 , BMI Body mass index is 41.99 kg/(m^2). GEN: Well nourished, well developed, in no acute distress HEENT: normal Neck: no JVD, carotid bruits, or masses Cardiac: RRR; no murmurs, rubs, or gallops,no edema  Respiratory:  clear to auscultation bilaterally, normal work of breathing GI: soft, nontender, nondistended, + BS MS: no deformity or atrophy Skin: warm and dry, no rash Neuro:  Strength and sensation are intact Psych: euthymic mood, full affect   EKG:  EKG is ordered today. The ekg ordered today demonstrates sinus  bradycardia but otherwise normal other than an RSR prime in V1.   Recent Labs: No results found for requested labs within last 365 days.    Lipid Panel    Component Value Date/Time   CHOL  10/05/2008 0615    154        ATP III CLASSIFICATION:  <200     mg/dL   Desirable  200-239  mg/dL   Borderline High  >=240    mg/dL   High          TRIG 326* 10/05/2008 0615   HDL 25* 10/05/2008 0615   CHOLHDL 6.2 10/05/2008 0615   VLDL 65* 10/05/2008 0615   LDLCALC  10/05/2008 0615    64        Total Cholesterol/HDL:CHD Risk Coronary Heart Disease Risk Table                     Men   Women  1/2 Average Risk   3.4   3.3  Average Risk       5.0   4.4  2 X Average Risk   9.6   7.1  3 X Average Risk  23.4   11.0        Use the calculated Patient Ratio above and the CHD Risk Table to determine the patient's CHD Risk.        ATP III CLASSIFICATION (LDL):  <100     mg/dL   Optimal  100-129  mg/dL   Near or Above                    Optimal  130-159  mg/dL   Borderline  160-189  mg/dL   High  >190     mg/dL   Very High      Wt Readings from Last 3 Encounters:  09/12/14 237 lb (107.502 kg)  09/17/12 220 lb (99.791 kg)      Other studies Reviewed: Additional studies/ records that were reviewed today include: . Review of the above records demonstrates:    ASSESSMENT AND PLAN:  Atherosclerosis of coronary artery bypass graft of native heart without angina pectoris : No angina  Hyperlipidemia: Current status not known  Essential hypertension: Elevated  TOBACCO ABUSE: Stopped 4 weeks ago  Chronic diastolic heart failure: Lower extremity edema. Rule out pulmonary hypertension from sleep apnea versus diastolic heart failure  Obstructive sleep apnea  Bilateral lower extremity edema - likely multifactorial and possibly related to obstructive sleep apnea, diastolic heart failure, pulmonary hypertension that is likely secondary. An echocardiogram will help exclude significant  structural abnormality and may help [place] quantitative pulmonary pressures.   Current medicines are reviewed at length with the patient today.  The patient  has concerns regarding medicines.  The following changes have been made:  We will decrease atenolol to 50 mg daily, start HCTZ, check basic metabolic panel in 4 weeks, and also perform a 2-D Doppler echocardiogram to exclude pulmonary hypertension and diastolic dysfunction.  Labs/ tests ordered today include:   Orders Placed This Encounter  Procedures  . Basic metabolic panel  . EKG 12-Lead  . Echocardiogram     Disposition:   FU with HS in 1 year  Signed, Sinclair Grooms, MD  09/12/2014 12:45 PM    [address], [place], [place]  [address] Phone: [phone]; Fax: [phone]

## 2014-10-17 ENCOUNTER — Other Ambulatory Visit (INDEPENDENT_AMBULATORY_CARE_PROVIDER_SITE_OTHER): Payer: BLUE CROSS/BLUE SHIELD

## 2014-10-17 ENCOUNTER — Other Ambulatory Visit: Payer: Self-pay

## 2014-10-17 ENCOUNTER — Ambulatory Visit (HOSPITAL_COMMUNITY): Payer: BLUE CROSS/BLUE SHIELD | Attending: Internal Medicine

## 2014-10-17 DIAGNOSIS — I5032 Chronic diastolic (congestive) heart failure: Secondary | ICD-10-CM

## 2014-10-17 DIAGNOSIS — I071 Rheumatic tricuspid insufficiency: Secondary | ICD-10-CM | POA: Diagnosis not present

## 2014-10-17 DIAGNOSIS — I509 Heart failure, unspecified: Secondary | ICD-10-CM | POA: Diagnosis present

## 2014-10-17 LAB — BASIC METABOLIC PANEL
BUN: 13 mg/dL (ref 6–23)
CHLORIDE: 103 meq/L (ref 96–112)
CO2: 29 meq/L (ref 19–32)
Calcium: 9.3 mg/dL (ref 8.4–10.5)
Creatinine, Ser: 0.83 mg/dL (ref 0.40–1.20)
GFR: 73.43 mL/min (ref >60.00)
Glucose, Bld: 106 mg/dL — ABNORMAL HIGH (ref 70–99)
POTASSIUM: 4.5 meq/L (ref 3.5–5.1)
Sodium: 139 mEq/L (ref 135–145)

## 2014-10-18 ENCOUNTER — Telehealth: Payer: Self-pay

## 2014-10-18 NOTE — Telephone Encounter (Signed)
Lmtcb. Called to give pt echo and lab results.

## 2014-10-18 NOTE — Telephone Encounter (Signed)
-----   Message from Belva Crome, MD sent at 10/17/2014  6:08 PM EDT ----- Essentially normal

## 2014-10-31 ENCOUNTER — Encounter (HOSPITAL_COMMUNITY): Payer: Self-pay | Admitting: Emergency Medicine

## 2014-10-31 ENCOUNTER — Emergency Department (HOSPITAL_COMMUNITY): Payer: BLUE CROSS/BLUE SHIELD

## 2014-10-31 ENCOUNTER — Observation Stay (HOSPITAL_COMMUNITY)
Admission: EM | Admit: 2014-10-31 | Discharge: 2014-11-01 | Disposition: A | Discharge status: Home / Self Care | Payer: BLUE CROSS/BLUE SHIELD | Attending: Internal Medicine | Admitting: Internal Medicine

## 2014-10-31 DIAGNOSIS — Z7982 Long term (current) use of aspirin: Secondary | ICD-10-CM | POA: Diagnosis not present

## 2014-10-31 DIAGNOSIS — R29810 Facial weakness: Secondary | ICD-10-CM | POA: Diagnosis present

## 2014-10-31 DIAGNOSIS — I6350 Cerebral infarction due to unspecified occlusion or stenosis of unspecified cerebral artery: Secondary | ICD-10-CM

## 2014-10-31 DIAGNOSIS — E785 Hyperlipidemia, unspecified: Secondary | ICD-10-CM | POA: Diagnosis not present

## 2014-10-31 DIAGNOSIS — G510 Bell's palsy: Principal | ICD-10-CM | POA: Insufficient documentation

## 2014-10-31 DIAGNOSIS — G4733 Obstructive sleep apnea (adult) (pediatric): Secondary | ICD-10-CM | POA: Diagnosis not present

## 2014-10-31 DIAGNOSIS — I25708 Atherosclerosis of coronary artery bypass graft(s), unspecified, with other forms of angina pectoris: Secondary | ICD-10-CM | POA: Diagnosis present

## 2014-10-31 DIAGNOSIS — Z951 Presence of aortocoronary bypass graft: Secondary | ICD-10-CM | POA: Insufficient documentation

## 2014-10-31 DIAGNOSIS — I10 Essential (primary) hypertension: Secondary | ICD-10-CM | POA: Diagnosis not present

## 2014-10-31 DIAGNOSIS — M1990 Unspecified osteoarthritis, unspecified site: Secondary | ICD-10-CM | POA: Insufficient documentation

## 2014-10-31 DIAGNOSIS — I5032 Chronic diastolic (congestive) heart failure: Secondary | ICD-10-CM | POA: Diagnosis not present

## 2014-10-31 DIAGNOSIS — Z87891 Personal history of nicotine dependence: Secondary | ICD-10-CM | POA: Insufficient documentation

## 2014-10-31 DIAGNOSIS — I2510 Atherosclerotic heart disease of native coronary artery without angina pectoris: Secondary | ICD-10-CM | POA: Insufficient documentation

## 2014-10-31 LAB — RAPID URINE DRUG SCREEN, HOSP PERFORMED
Amphetamines: NOT DETECTED
BARBITURATES: NOT DETECTED
BENZODIAZEPINES: NOT DETECTED
Cocaine: NOT DETECTED
Opiates: NOT DETECTED
Tetrahydrocannabinol: NOT DETECTED

## 2014-10-31 LAB — COMPREHENSIVE METABOLIC PANEL
ALK PHOS: 92 U/L (ref 38–126)
ALT: 58 U/L — ABNORMAL HIGH (ref 14–54)
ANION GAP: 12 (ref 5–15)
AST: 50 U/L — ABNORMAL HIGH (ref 15–41)
Albumin: 3.9 g/dL (ref 3.5–5.0)
BILIRUBIN TOTAL: 0.6 mg/dL (ref 0.3–1.2)
BUN: 15 mg/dL (ref 6–20)
CALCIUM: 9.4 mg/dL (ref 8.9–10.3)
CHLORIDE: 103 mmol/L (ref 101–111)
CO2: 21 mmol/L — ABNORMAL LOW (ref 22–32)
CREATININE: 0.98 mg/dL (ref 0.44–1.00)
GFR calc Af Amer: >60 mL/min (ref >60)
GFR calc non Af Amer: 60 mL/min — ABNORMAL LOW (ref >60)
GLUCOSE: 139 mg/dL — AB (ref 65–99)
POTASSIUM: 3.6 mmol/L (ref 3.5–5.1)
SODIUM: 136 mmol/L (ref 135–145)
TOTAL PROTEIN: 7.4 g/dL (ref 6.5–8.1)

## 2014-10-31 LAB — CBC WITH DIFFERENTIAL/PLATELET
BASOS ABS: 0.1 10*3/uL (ref 0.0–0.1)
BASOS PCT: 1 % (ref 0–1)
EOS ABS: 0.2 10*3/uL (ref 0.0–0.7)
EOS PCT: 3 % (ref 0–5)
HCT: 42 % (ref 36.0–46.0)
HEMOGLOBIN: 14.4 g/dL (ref 12.0–15.0)
LYMPHS ABS: 4.2 10*3/uL — AB (ref 0.7–4.0)
Lymphocytes Relative: 49 % — ABNORMAL HIGH (ref 12–46)
MCH: 29.2 pg (ref 26.0–34.0)
MCHC: 34.3 g/dL (ref 30.0–36.0)
MCV: 85.2 fL (ref 78.0–100.0)
MONO ABS: 0.5 10*3/uL (ref 0.1–1.0)
MONOS PCT: 6 % (ref 3–12)
NEUTROS ABS: 3.4 10*3/uL (ref 1.7–7.7)
Neutrophils Relative %: 41 % — ABNORMAL LOW (ref 43–77)
Platelets: 325 10*3/uL (ref 150–400)
RBC: 4.93 MIL/uL (ref 3.87–5.11)
RDW: 13.2 % (ref 11.5–15.5)
WBC: 8.3 10*3/uL (ref 4.0–10.5)

## 2014-10-31 LAB — URINALYSIS, ROUTINE W REFLEX MICROSCOPIC
BILIRUBIN URINE: NEGATIVE
Glucose, UA: NEGATIVE mg/dL
HGB URINE DIPSTICK: NEGATIVE
Ketones, ur: NEGATIVE mg/dL
Leukocytes, UA: NEGATIVE
Nitrite: NEGATIVE
PH: 5.5 (ref 5.0–8.0)
Protein, ur: NEGATIVE mg/dL
Specific Gravity, Urine: 1.017 (ref 1.005–1.030)
Urobilinogen, UA: 0.2 mg/dL (ref 0.0–1.0)

## 2014-10-31 LAB — PROTIME-INR
INR: 0.9 (ref 0.00–1.49)
PROTHROMBIN TIME: 12.4 s (ref 11.6–15.2)

## 2014-10-31 LAB — I-STAT TROPONIN, ED: Troponin i, poc: 0 ng/mL (ref 0.00–0.08)

## 2014-10-31 LAB — ETHANOL: Alcohol, Ethyl (B): <5 mg/dL (ref <5)

## 2014-10-31 MED ORDER — ASPIRIN 325 MG PO TABS
325.0000 mg | ORAL_TABLET | Freq: Every day | ORAL | Status: DC
  Administered 2014-10-31: 325 mg via ORAL
  Filled 2014-10-31: qty 1

## 2014-10-31 NOTE — Consult Note (Addendum)
Referring Physician: Dr Roxanne Mins    Chief Complaint: right face weakness, concern for stroke  HPI:                                                                                                                                         Kathryn Castillo is an 64 y.o. female with a past medical history that is relevant for HTN, hyperlipidemia, CAD s/p CABG, OSA on CPAP, and arthritis, brought in by family for evaluation of new onset right face weakness. Never had similar symptoms before. Patient states that her face began to feel strange today around 1400 when she realized that " there was something different left eye". Then, her daughter came home and noticed that " the left eye was struggling to keep open but the right face was saggy". She denies HA, vertigo, double vision, difficulty swallowing, unsteadiness, slurred speech, focal arm or legs weakness, confusion, bladder/bnowel dysfunction, muscle pain,  cramps, fatigue, language or vision impairment. No recent URI, fever, rash, or infection. No difficulty chewing or swallowing, no altered taste, no retroauricular pain or hyperacusis, but said that she  just  noticed increased lacrimation right eye. CT brain was personally reviewed and showed no acute abnormality.  Date last known well: 10/31/14 Time last known well: 1400 tPA Given: no, late presentation MRS: 0  Past Medical History  Diagnosis Date  . Hypertension   . Coronary artery disease   . Sleep apnea     uses a cpap  . Hyperlipemia   . Arthritis   . Wears glasses   . Wears partial dentures     upper partial  . Family history of anesthesia complication     father hallucinates  . Trigger thumb of left hand 09/17/2012    Past Surgical History  Procedure Laterality Date  . Dilation and curettage of uterus  1985  . Tubal ligation    . Knee arthroscopy with patella reconstruction  1968    left  . Coronary artery bypass graft  1996    x1  . Nasal septoplasty w/ turbinoplasty  2008     to help tolerated cpap  . Cardiac catheterization  96,01,10    x3  . Trigger finger release Left 09/17/2012    Procedure: RELEASE TRIGGER FINGER/A-1 PULLEY LEFT THUMB (TENDON SHEATH INCISION);  Surgeon: Johnny Bridge, MD;  Location: [place];  Service: Orthopedics;  Laterality: Left;    Family History  Problem Relation Age of Onset  . Stroke Mother   . Stroke Father   . Heart attack Father   . Heart disease Father   . Hyperlipidemia Father   . Hypertension Father    Social History:  reports that she quit smoking about 3 months ago. She does not have any smokeless tobacco history on file. She reports that she does not drink alcohol or use illicit drugs.  Family history:  no brain tumors, MS, epilepsy  Allergies:  Allergies  Allergen Reactions  . Sulfamethoxazole-Trimethoprim Other (See Comments)    REACTION: "DON'T REMEMBER"    Medications:                                                                                                                           I have reviewed the patient's current medications.  ROS:                                                                                                                                       History obtained from chart review and the patient  General ROS: negative for - chills, fatigue, fever, night sweats, or weight loss Psychological ROS: negative for - behavioral disorder, hallucinations, memory difficulties, mood swings or suicidal ideation Ophthalmic ROS: negative for - blurry vision, double vision, eye pain or loss of vision ENT ROS: negative for - epistaxis, nasal discharge, oral lesions, sore throat, tinnitus or vertigo Allergy and Immunology ROS: negative for - hives or itchy/watery eyes Hematological and Lymphatic ROS: negative for - bleeding problems, bruising or swollen lymph nodes Endocrine ROS: negative for - galactorrhea, hair pattern changes, polydipsia/polyuria or temperature  intolerance Respiratory ROS: negative for - cough, hemoptysis, shortness of breath or wheezing Cardiovascular ROS: negative for - chest pain, dyspnea on exertion, edema or irregular heartbeat Gastrointestinal ROS: negative for - abdominal pain, diarrhea, hematemesis, nausea/vomiting or stool incontinence Genito-Urinary ROS: negative for - dysuria, hematuria, incontinence or urinary frequency/urgency Musculoskeletal ROS: negative for - joint swelling or muscular weakness Neurological ROS: as noted in HPI Dermatological ROS: negative for rash and skin lesion changes   Physical exam: pleasant female in no apparent distress. Blood pressure 153/60, pulse 62, temperature 97.4 F (36.3 C), temperature source Oral, resp. rate 15, SpO2 99 %. Head: normocephalic. Neck: supple, no bruits, no JVD. Cardiac: no murmurs. Lungs: clear. Abdomen: soft, no tender, no mass. Extremities: no edema. Skin: no rash Neurologic Examination:  General: Mental Status: Alert, oriented, thought content appropriate.  Speech fluent without evidence of aphasia.  Able to follow 3 step commands without difficulty. Cranial Nerves: II: Discs flat bilaterally; Visual fields grossly normal, pupils equal, round, reactive to light and accommodation III,IV, VI: ptosis not present, extra-ocular motions intact bilaterally V,VII: smile asymmetric due to right face weakness (mild weakness of the right orbicularis oculi, right orbicularis oris, right buccinator, as well as decreased blinking right eyelid), facial light touch sensation normal bilaterally VIII: hearing normal bilaterally IX,X: uvula rises symmetrically XI: bilateral shoulder shrug XII: midline tongue extension without atrophy or fasciculations  Motor: Right : Upper extremity   5/5    Left:     Upper extremity   5/5  Lower extremity   5/5     Lower extremity    5/5 Tone and bulk:normal tone throughout; no atrophy noted Sensory: Pinprick and light touch intact throughout, bilaterally Deep Tendon Reflexes:  Right: Upper Extremity   Left: Upper extremity   biceps (C-5 to C-6) 2/4   biceps (C-5 to C-6) 2/4 tricep (C7) 2/4    triceps (C7) 2/4 Brachioradialis (C6) 2/4  Brachioradialis (C6) 2/4  Lower Extremity Lower Extremity  quadriceps (L-2 to L-4) 2/4   quadriceps (L-2 to L-4) 2/4 Achilles (S1) 2/4   Achilles (S1) 2/4  Plantars: Right: downgoing   Left: downgoing Cerebellar: normal finger-to-nose,  normal heel-to-shin test Gait:  No tested due to multiple leads CV: pulses palpable throughout    Results for orders placed or performed during the hospital encounter of 10/31/14 (from the past 48 hour(s))  Ethanol     Status: None   Collection Time: 10/31/14 10:22 PM  Result Value Ref Range   Alcohol, Ethyl (B) <5 <5 mg/dL    Comment:        LOWEST DETECTABLE LIMIT FOR SERUM ALCOHOL IS 5 mg/dL FOR MEDICAL PURPOSES ONLY   Comprehensive metabolic panel     Status: Abnormal   Collection Time: 10/31/14 10:23 PM  Result Value Ref Range   Sodium 136 135 - 145 mmol/L   Potassium 3.6 3.5 - 5.1 mmol/L   Chloride 103 101 - 111 mmol/L   CO2 21 (L) 22 - 32 mmol/L   Glucose, Bld 139 (H) 65 - 99 mg/dL   BUN 15 6 - 20 mg/dL   Creatinine, Ser 0.98 0.44 - 1.00 mg/dL   Calcium 9.4 8.9 - 10.3 mg/dL   Total Protein 7.4 6.5 - 8.1 g/dL   Albumin 3.9 3.5 - 5.0 g/dL   AST 50 (H) 15 - 41 U/L   ALT 58 (H) 14 - 54 U/L   Alkaline Phosphatase 92 38 - 126 U/L   Total Bilirubin 0.6 0.3 - 1.2 mg/dL   GFR calc non Af Amer 60 (L) >60 mL/min   GFR calc Af Amer >60 >60 mL/min    Comment: (NOTE) The eGFR has been calculated using the CKD EPI equation. This calculation has not been validated in all clinical situations. eGFR's persistently <60 mL/min signify possible Chronic Kidney Disease.    Anion gap 12 5 - 15  CBC WITH DIFFERENTIAL     Status: Abnormal    Collection Time: 10/31/14 10:23 PM  Result Value Ref Range   WBC 8.3 4.0 - 10.5 K/uL   RBC 4.93 3.87 - 5.11 MIL/uL   Hemoglobin 14.4 12.0 - 15.0 g/dL   HCT 42.0 36.0 - 46.0 %   MCV 85.2 78.0 - 100.0 fL   MCH 29.2 26.0 -  34.0 pg   MCHC 34.3 30.0 - 36.0 g/dL   RDW 13.2 11.5 - 15.5 %   Platelets 325 150 - 400 K/uL   Neutrophils Relative % 41 (L) 43 - 77 %   Neutro Abs 3.4 1.7 - 7.7 K/uL   Lymphocytes Relative 49 (H) 12 - 46 %   Lymphs Abs 4.2 (H) 0.7 - 4.0 K/uL   Monocytes Relative 6 3 - 12 %   Monocytes Absolute 0.5 0.1 - 1.0 K/uL   Eosinophils Relative 3 0 - 5 %   Eosinophils Absolute 0.2 0.0 - 0.7 K/uL   Basophils Relative 1 0 - 1 %   Basophils Absolute 0.1 0.0 - 0.1 K/uL  Protime-INR     Status: None   Collection Time: 10/31/14 10:23 PM  Result Value Ref Range   Prothrombin Time 12.4 11.6 - 15.2 seconds   INR 0.90 0.00 - 1.49  I-Stat Troponin, ED  (not at [facility], [facility])     Status: None   Collection Time: 10/31/14 10:28 PM  Result Value Ref Range   Troponin i, poc 0.00 0.00 - 0.08 ng/mL   Comment 3            Comment: Due to the release kinetics of cTnI, a negative result within the first hours of the onset of symptoms does not rule out myocardial infarction with certainty. If myocardial infarction is still suspected, repeat the test at appropriate intervals.    Ct Head Wo Contrast  10/31/2014   CLINICAL DATA:  Initial evaluation for acute right facial droop.  EXAM: CT HEAD WITHOUT CONTRAST  TECHNIQUE: Contiguous axial images were obtained from the base of the skull through the vertex without intravenous contrast.  COMPARISON:  None.  FINDINGS: Mild diffuse prominence of the CSF containing spaces is compatible with generalized age-related cerebral atrophy. Patchy and confluent hypodensity within the periventricular and deep white matter of both cerebral hemispheres most consistent with chronic small vessel ischemic changes.  No acute large vessel territory infarct identified. No  acute intracranial hemorrhage.  No mass lesion, midline shift, or mass effect. No hydrocephalus. No extra-axial fluid collection.  Scalp soft tissues within normal limits. No acute abnormality about the orbits.  Calvarium intact. Paranasal sinuses and mastoid air cells are well pneumatized and free of fluid.  IMPRESSION: 1. No acute intracranial process identified. 2. Generalized age-related cerebral atrophy with moderate chronic microvascular ischemic disease.   Electronically Signed   By: Jeannine Boga M.D.   On: 10/31/2014 22:52    Assessment: 64 y.o. female with several risk factors for stroke, comes in with new onset isolated right face weakness, central type. CT brain without acute abnormality. Patient has some atypical features but obvious mild weakness of the right orbicularis oculi, right orbicularis oris, right buccinator, as well as decreased blinking right eyelid, thus her syndrome is most likely due to a right Bell's palsy. Agree with MRI brain and d/c home on a one week course of steroids and valacyclovir if MRI negative for stroke.   Dorian Pod ,MD [facility] [phone]  10/31/2014, 11:17 PM

## 2014-10-31 NOTE — ED Notes (Signed)
Patient here via family member asking her to come to hospital. Family reports that upon seeing patient today she noted facial droop. Patient states that her face began to feel strange today around 1400. Also reports left eye feels different. Reports no change in vision. Obvious facial asymetry with smiling. No other focal deficits identified.

## 2014-10-31 NOTE — H&P (Addendum)
[facility] History and Physical  Kathryn Castillo:[id number] DOB: 10-Jun-1950 DOA: 10/31/2014  Referring physician: ED physician PCP: Kandice Hams, MD  Specialists:   Chief Complaint: Right facial droop and difficult opening right eye   HPI: Kathryn Castillo is a 64 y.o. female with PMH of hypertension, hyperlipidemia, osteoarthritis, CAD (s/p of CABG 1996), OSA on CPAP, dCHF (EF 60-65 with grade 1 diastolic dysfunction), who presents with right facial droop and difficult opening right eye.  Patient reports that at about 2 PM, she started feeling weak in her right face. She felt left eye fatigue throughout the day at work, with mild blurry vision and tension behind her left occular area. Her daughter noticed that " the left eye was struggling to keep open but the right face was saggy". Currently patient denies fever, chills, fatigue, running nose, ear pain, headaches, cough, chest pain, SOB, abdominal pain, diarrhea, constipation, dysuria, urgency, frequency, hematuria, skin rashes, joint pain or leg swelling. No unilateral weakness, numbness or tingling sensations. No hearing loss.  In ED, patient was found to have WBC 8.3, negative troponin, and heart failure 0.90, temperature normal, no tachycardia, electrolytes okay. CT head is negative for acute abnormalities. Patient is admitted to inpatient for further evaluation and treatment. Neurology was consulted by ED.  Where does patient live?   At home    Can patient participate in ADLs?  Yes     Review of Systems:   General: no fevers, chills, no changes in body weight, No fatigue HEENT: has blurry vision, no hearing changes or sore throat Pulm: no dyspnea, coughing, wheezing CV: no chest pain, palpitations Abd: no nausea, vomiting, abdominal pain, diarrhea, constipation GU: no dysuria, burning on urination, increased urinary frequency, hematuria  Ext: no leg edema Neuro: no unilateral weakness, numbness, or tingling, No hearing  loss. Has right facial droop. Skin: no rash MSK: No muscle spasm, no deformity, no limitation of range of movement in spin Heme: No easy bruising.  Travel history: No recent long distant travel.  Allergy:  Allergies  Allergen Reactions  . Sulfamethoxazole-Trimethoprim Other (See Comments)    REACTION: "DON'T REMEMBER"    Past Medical History  Diagnosis Date  . Hypertension   . Coronary artery disease   . Sleep apnea     uses a cpap  . Hyperlipemia   . Arthritis   . Wears glasses   . Wears partial dentures     upper partial  . Family history of anesthesia complication     father hallucinates  . Trigger thumb of left hand 09/17/2012    Past Surgical History  Procedure Laterality Date  . Dilation and curettage of uterus  1985  . Tubal ligation    . Knee arthroscopy with patella reconstruction  1968    left  . Coronary artery bypass graft  1996    x1  . Nasal septoplasty w/ turbinoplasty  2008    to help tolerated cpap  . Cardiac catheterization  96,01,10    x3  . Trigger finger release Left 09/17/2012    Procedure: RELEASE TRIGGER FINGER/A-1 PULLEY LEFT THUMB (TENDON SHEATH INCISION);  Surgeon: Johnny Bridge, MD;  Location: [place];  Service: Orthopedics;  Laterality: Left;    Social History:  reports that she quit smoking about 3 months ago. She does not have any smokeless tobacco history on file. She reports that she does not drink alcohol or use illicit drugs.  Family History:  Family History  Problem Relation  Age of Onset  . Stroke Mother   . Stroke Father   . Heart attack Father   . Heart disease Father   . Hyperlipidemia Father   . Hypertension Father      Prior to Admission medications   Medication Sig Start Date End Date Taking? Authorizing Provider  aspirin 81 MG tablet Take 81 mg by mouth at bedtime.    Yes Historical Provider, MD  atenolol (TENORMIN) 50 MG tablet Take 1 tablet (50 mg total) by mouth every evening. 09/12/14  Yes  Belva Crome, MD  calcium carbonate (OS-CAL - DOSED IN MG OF ELEMENTAL CALCIUM) 1250 MG tablet Take 1 tablet by mouth daily.   Yes Historical Provider, MD  cephALEXin (KEFLEX) 500 MG capsule Take 500 mg by mouth as needed (takes as needed for boils).   Yes Historical Provider, MD  cholecalciferol (VITAMIN D) 1000 UNITS tablet Take 1,000 Units by mouth daily.   Yes Historical Provider, MD  fish oil-omega-3 fatty acids 1000 MG capsule Take 1,200 mg by mouth daily.   Yes Historical Provider, MD  ibuprofen (ADVIL,MOTRIN) 200 MG tablet Take 200 mg by mouth every 6 (six) hours as needed for mild pain or moderate pain.   Yes Historical Provider, MD  loratadine (CLARITIN) 10 MG tablet Take 10 mg by mouth daily as needed for allergies.   Yes Historical Provider, MD  Multiple Vitamins-Minerals (MULTIVITAMIN WITH MINERALS) tablet Take 1 tablet by mouth daily.   Yes Historical Provider, MD  naproxen sodium (ANAPROX) 220 MG tablet Take 220 mg by mouth 2 (two) times daily as needed (pain).   Yes Historical Provider, MD  simvastatin (ZOCOR) 20 MG tablet Take 20 mg by mouth at bedtime.   Yes Historical Provider, MD  hydrochlorothiazide (MICROZIDE) 12.5 MG capsule Take 1 capsule (12.5 mg total) by mouth daily. Patient not taking: Reported on 10/31/2014 09/12/14   Belva Crome, MD    Physical Exam: Filed Vitals:   10/31/14 2300 10/31/14 2310 10/31/14 2330 11/01/14 0000  BP: 141/47  149/50 119/45  Pulse: 58  60 55  Temp:  97.4 F (36.3 C)    TempSrc:      Resp: 18  14   SpO2: 99%  100% 99%   General: Not in acute distress HEENT:       Eyes: PERRL, no scleral icterus. Has mild weakness of the right orbicularis muscle and weak blinking right eyelid.       ENT: No discharge from the ears and nose, no pharynx injection, no tonsillar enlargement.        Neck: No JVD, no bruit, no mass felt. Heme: No neck lymph node enlargement. Cardiac: S1/S2, RRR, No murmurs, No gallops or rubs. Pulm: No rales, wheezing,  rhonchi or rubs. Abd: Soft, nondistended, nontender, no rebound pain, no organomegaly, BS present. Ext: No pitting leg edema bilaterally. 2+DP/PT pulse bilaterally. Musculoskeletal: No joint deformities, No joint redness or warmth, no limitation of ROM in spin. Skin: No rashes.  Neuro: Alert, oriented X3, cranial nerves II-XII grossly intact except for right facial droop, muscle strength 5/5 in all extremities, sensation to light touch intact. Brachial reflex 2+ bilaterally. Knee reflex 1+ bilaterally. Negative Babinski's sign. Normal finger to nose test. Psych: Patient is not psychotic, no suicidal or hemocidal ideation.  Labs on Admission:  Basic Metabolic Panel:  Recent Labs Lab 10/31/14 2223  NA 136  K 3.6  CL 103  CO2 21*  GLUCOSE 139*  BUN 15  CREATININE 0.98  CALCIUM 9.4   Liver Function Tests:  Recent Labs Lab 10/31/14 2223  AST 50*  ALT 58*  ALKPHOS 92  BILITOT 0.6  PROT 7.4  ALBUMIN 3.9   No results for input(s): LIPASE, AMYLASE in the last 168 hours. No results for input(s): AMMONIA in the last 168 hours. CBC:  Recent Labs Lab 10/31/14 2223  WBC 8.3  NEUTROABS 3.4  HGB 14.4  HCT 42.0  MCV 85.2  PLT 325   Cardiac Enzymes: No results for input(s): CKTOTAL, CKMB, CKMBINDEX, TROPONINI in the last 168 hours.  BNP (last 3 results) No results for input(s): BNP in the last 8760 hours.  ProBNP (last 3 results) No results for input(s): PROBNP in the last 8760 hours.  CBG: No results for input(s): GLUCAP in the last 168 hours.  Radiological Exams on Admission: Ct Head Wo Contrast  10/31/2014   CLINICAL DATA:  Initial evaluation for acute right facial droop.  EXAM: CT HEAD WITHOUT CONTRAST  TECHNIQUE: Contiguous axial images were obtained from the base of the skull through the vertex without intravenous contrast.  COMPARISON:  None.  FINDINGS: Mild diffuse prominence of the CSF containing spaces is compatible with generalized age-related cerebral atrophy.  Patchy and confluent hypodensity within the periventricular and deep white matter of both cerebral hemispheres most consistent with chronic small vessel ischemic changes.  No acute large vessel territory infarct identified. No acute intracranial hemorrhage.  No mass lesion, midline shift, or mass effect. No hydrocephalus. No extra-axial fluid collection.  Scalp soft tissues within normal limits. No acute abnormality about the orbits.  Calvarium intact. Paranasal sinuses and mastoid air cells are well pneumatized and free of fluid.  IMPRESSION: 1. No acute intracranial process identified. 2. Generalized age-related cerebral atrophy with moderate chronic microvascular ischemic disease.   Electronically Signed   By: Jeannine Boga M.D.   On: 10/31/2014 22:52    EKG: Independently reviewed.  Abnormal findings: Incomplete right bundle blockage   Assessment/Plan Principal Problem:   Bell's palsy Active Problems:   Hyperlipidemia   Obstructive sleep apnea   Essential hypertension   Coronary atherosclerosis   Chronic diastolic heart failure  Bells' palsy: Patient's symptoms are most likely caused by Bell's palsy. Given her significant risk factors, including hypertension, hyperlipidemia  and CAD, will need to rule out stroke. Neurology was consulted, Dr. Armida Sans saw patient, agreed to treat the patient as Bell's palsy and rule out stroke.  -will admit to tele bed -Acyclovir 400 mg 4 times a day for 7 days -Prednisone 50 mg daily for 7 days -MRI-brain to r/o stroke  CAD: s/p of CBAG. No chest pain. Troponin negative. -Continue aspirin, atenolol  HLD: Last LDL was 64 on 10/05/08 -Continue home medications: Zocor and fish oil  Diastolic congestive heart failure: 2-D echo on 10/17/14 showed EF of 60-65% with grade 1 diastolic dysfunction. CHF is compensated. Patient does not have any leg edema. Patient used to be taking HCTZ, but is not taking currently -check BNP -Continue aspirin and  atenolol  Hypertension: -Atenolol -Hydralazine and when necessary  OSA: -CPAP   DVT ppx: SQ Heparin     Code Status: Full code Family Communication:  Yes, patient's daughter at bed side Disposition Plan: Admit to inpatient   Date of Service 11/01/2014    Ivor Costa [facility] Pager [phone]  If 7PM-7AM, please contact night-coverage [email or web address] Password [facility] 11/01/2014, 12:13 AM

## 2014-10-31 NOTE — ED Provider Notes (Signed)
CSN: [id number]     Arrival date & time 10/31/14  2142 History   First MD Initiated Contact with Patient 10/31/14 2213     Chief Complaint  Patient presents with  . Facial Droop  . Numbness    (Consider location/radiation/quality/duration/timing/severity/associated sxs/prior Treatment) Patient is a 64 y.o. female presenting with neurologic complaint. The history is provided by the patient.  Neurologic Problem This is a new problem. The current episode started today. The problem occurs constantly. The problem has been unchanged. Associated symptoms include a visual change and weakness. Pertinent negatives include no abdominal pain, change in bowel habit, chest pain, chills, congestion, coughing, diaphoresis, fatigue, headaches, myalgias, nausea, numbness, vertigo or vomiting. Nothing aggravates the symptoms. She has tried nothing for the symptoms.    Past Medical History  Diagnosis Date  . Hypertension   . Coronary artery disease   . Sleep apnea     uses a cpap  . Hyperlipemia   . Arthritis   . Wears glasses   . Wears partial dentures     upper partial  . Family history of anesthesia complication     father hallucinates  . Trigger thumb of left hand 09/17/2012   Past Surgical History  Procedure Laterality Date  . Dilation and curettage of uterus  1985  . Tubal ligation    . Knee arthroscopy with patella reconstruction  1968    left  . Coronary artery bypass graft  1996    x1  . Nasal septoplasty w/ turbinoplasty  2008    to help tolerated cpap  . Cardiac catheterization  96,01,10    x3  . Trigger finger release Left 09/17/2012    Procedure: RELEASE TRIGGER FINGER/A-1 PULLEY LEFT THUMB (TENDON SHEATH INCISION);  Surgeon: Johnny Bridge, MD;  Location: [place] [facility];  Service: Orthopedics;  Laterality: Left;   Family History  Problem Relation Age of Onset  . Stroke Mother   . Stroke Father   . Heart attack Father   . Heart disease Father   . Hyperlipidemia  Father   . Hypertension Father    History  Substance Use Topics  . Smoking status: Former Smoker -- 1.00 packs/day    Quit date: 07/30/2014  . Smokeless tobacco: Not on file  . Alcohol Use: No   OB History    No data available     Review of Systems  Constitutional: Negative for chills, diaphoresis and fatigue.  HENT: Negative for congestion.   Eyes: Positive for visual disturbance.  Respiratory: Negative for cough, chest tightness and shortness of breath.   Cardiovascular: Negative for chest pain.  Gastrointestinal: Negative for nausea, vomiting, abdominal pain and change in bowel habit.  Musculoskeletal: Negative for myalgias and gait problem.  Neurological: Positive for facial asymmetry and weakness. Negative for dizziness, vertigo, speech difficulty, light-headedness, numbness and headaches.  Psychiatric/Behavioral: Negative for confusion.  All other systems reviewed and are negative.     Allergies  Sulfamethoxazole-trimethoprim  Home Medications   Prior to Admission medications   Medication Sig Start Date End Date Taking? Authorizing Provider  aspirin 81 MG tablet Take 81 mg by mouth at bedtime.    Yes Historical Provider, MD  atenolol (TENORMIN) 50 MG tablet Take 1 tablet (50 mg total) by mouth every evening. 09/12/14  Yes Belva Crome, MD  calcium carbonate (OS-CAL - DOSED IN MG OF ELEMENTAL CALCIUM) 1250 MG tablet Take 1 tablet by mouth daily.   Yes Historical Provider, MD  cephALEXin (KEFLEX) 500  MG capsule Take 500 mg by mouth as needed (takes as needed for boils).   Yes Historical Provider, MD  cholecalciferol (VITAMIN D) 1000 UNITS tablet Take 1,000 Units by mouth daily.   Yes Historical Provider, MD  fish oil-omega-3 fatty acids 1000 MG capsule Take 1,200 mg by mouth daily.   Yes Historical Provider, MD  ibuprofen (ADVIL,MOTRIN) 200 MG tablet Take 200 mg by mouth every 6 (six) hours as needed for mild pain or moderate pain.   Yes Historical Provider, MD   loratadine (CLARITIN) 10 MG tablet Take 10 mg by mouth daily as needed for allergies.   Yes Historical Provider, MD  Multiple Vitamins-Minerals (MULTIVITAMIN WITH MINERALS) tablet Take 1 tablet by mouth daily.   Yes Historical Provider, MD  naproxen sodium (ANAPROX) 220 MG tablet Take 220 mg by mouth 2 (two) times daily as needed (pain).   Yes Historical Provider, MD  simvastatin (ZOCOR) 20 MG tablet Take 20 mg by mouth at bedtime.   Yes Historical Provider, MD  hydrochlorothiazide (MICROZIDE) 12.5 MG capsule Take 1 capsule (12.5 mg total) by mouth daily. Patient not taking: Reported on 10/31/2014 09/12/14   Belva Crome, MD   BP 143/60 mmHg  Pulse 65  Temp(Src) 97.5 F (36.4 C) (Oral)  Resp 14  SpO2 95%   Physical Exam  Constitutional: She is oriented to person, place, and time. She appears well-developed and well-nourished. No distress.  HENT:  Head: Atraumatic.  Nose: Nose normal.  Mouth/Throat: Oropharynx is clear and moist. No oropharyngeal exudate.  Right facial droop  Eyes: EOM are normal. Pupils are equal, round, and reactive to light.  Neck: Normal range of motion. Neck supple.  Cardiovascular: Normal rate, regular rhythm, normal heart sounds and intact distal pulses.   No murmur heard. Pulmonary/Chest: Effort normal and breath sounds normal. No respiratory distress. She has no wheezes. She exhibits no tenderness.  Abdominal: Soft. There is no tenderness. There is no rebound and no guarding.  Musculoskeletal: Normal range of motion. She exhibits no tenderness.  Lymphadenopathy:    She has no cervical adenopathy.  Neurological: She is alert and oriented to person, place, and time. No cranial nerve deficit. Coordination normal.  Awake, alert, oriented x 3.  Clear and fluent speech, no deficits.  Obvious right facial droop at rest and more pronounced with smiling.  + forehead movement bilaterally but weaker on the right.  Full strength and sensation of all extremities. Normal  coordination and gait exams  Skin: Skin is warm and dry. She is not diaphoretic.  Psychiatric: She has a normal mood and affect. Her behavior is normal. Judgment and thought content normal.  Nursing note and vitals reviewed.   ED Course  Procedures (including critical care time) Labs Review Labs Reviewed  COMPREHENSIVE METABOLIC PANEL - Abnormal; Notable for the following:    CO2 21 (*)    Glucose, Bld 139 (*)    AST 50 (*)    ALT 58 (*)    GFR calc non Af Amer 60 (*)    All other components within normal limits  CBC WITH DIFFERENTIAL/PLATELET - Abnormal; Notable for the following:    Neutrophils Relative % 41 (*)    Lymphocytes Relative 49 (*)    Lymphs Abs 4.2 (*)    All other components within normal limits  PROTIME-INR  ETHANOL  URINE RAPID DRUG SCREEN, HOSP PERFORMED  URINALYSIS, ROUTINE W REFLEX MICROSCOPIC (NOT AT [facility])  Randolm Idol, ED    Imaging Review Ct Head  Wo Contrast  10/31/2014   CLINICAL DATA:  Initial evaluation for acute right facial droop.  EXAM: CT HEAD WITHOUT CONTRAST  TECHNIQUE: Contiguous axial images were obtained from the base of the skull through the vertex without intravenous contrast.  COMPARISON:  None.  FINDINGS: Mild diffuse prominence of the CSF containing spaces is compatible with generalized age-related cerebral atrophy. Patchy and confluent hypodensity within the periventricular and deep white matter of both cerebral hemispheres most consistent with chronic small vessel ischemic changes.  No acute large vessel territory infarct identified. No acute intracranial hemorrhage.  No mass lesion, midline shift, or mass effect. No hydrocephalus. No extra-axial fluid collection.  Scalp soft tissues within normal limits. No acute abnormality about the orbits.  Calvarium intact. Paranasal sinuses and mastoid air cells are well pneumatized and free of fluid.  IMPRESSION: 1. No acute intracranial process identified. 2. 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   Electronically Signed   By: Jeannine Boga M.D.   On: 10/31/2014 22:52     EKG Interpretation   Date/Time:  Tuesday October 31 2014 21:48:24 EDT Ventricular Rate:  66 PR Interval:  180 QRS Duration: 96 QT Interval:  416 QTC Calculation: 436 R Axis:   19 Text Interpretation:  Normal sinus rhythm Incomplete right bundle branch  block Borderline ECG Sinus rhythm Right bundle branch block Borderline ECG  Confirmed by Carmin Muskrat  MD ([id number]) on 10/31/2014 9:49:16 PM      MDM   Final diagnoses:  Facial weakness   Pt is a 64 yo F with hx of HTN, CAD, dCHF, HLD, and hx of long term tobacco abuse (quit x 3 months) who presents with right facial droop since around 1400 today. Complained that she felt left eye fatigue throughout the day at work, with mild blurry vision and tension behind her left occular area.  She denies any numbness, weakness, confusion, or speech deficits.  She went home and when family returned, they were concerned for right facial droop so brought the patient into the ED for evaluation.   Awake, alert, oriented x 3.  Clear and fluent speech, no deficits.  Obvious right facial droop at rest and more pronounced with smiling.  + forehead movement bilaterally but weaker on the right.  Full strength and sensation of all extremities.   Clinically more concerned for CVA than Bell's Palsy so will obtain CT head and stroke labs to further evaluate.    CT head with no acute pathology noted, mild chronic microvascular changes.   Due to concern for continued right facial weakness in this patient with multiple risk factors, will admit for further stroke work up.   Given ASA 325 mg.  Spoke to Dr. Aram Beecham with neurology to consult and Dr. Blaine Hamper with Hospitalist service for admission.   Dr. Aram Beecham more concerned for Bell's palsy.  Will get MRI brain for further stroke rule out.   Dr. Blaine Hamper aware    If performed,  labs, EKGs, and imaging were reviewed and interpreted by myself and my attending, and incorporated in the medical decision making.  Patient was seen with ED Attending, Dr. Maryjean Ka, MD     Tori Milks, MD 83/15/17 [id number]  Delora Fuel, MD 73/71/06 [id number]

## 2014-10-31 NOTE — ED Notes (Signed)
Admitting at bedside 

## 2014-10-31 NOTE — ED Notes (Signed)
Spoke with MD Vanita Panda regarding patient. At this time he advises patient needs to be transported to room for evaluation, but hold on testing till MD evaluates patient.

## 2014-11-01 ENCOUNTER — Inpatient Hospital Stay (HOSPITAL_COMMUNITY): Payer: BLUE CROSS/BLUE SHIELD

## 2014-11-01 DIAGNOSIS — I5032 Chronic diastolic (congestive) heart failure: Secondary | ICD-10-CM | POA: Diagnosis not present

## 2014-11-01 DIAGNOSIS — E785 Hyperlipidemia, unspecified: Secondary | ICD-10-CM | POA: Diagnosis not present

## 2014-11-01 DIAGNOSIS — I10 Essential (primary) hypertension: Secondary | ICD-10-CM | POA: Diagnosis not present

## 2014-11-01 DIAGNOSIS — G510 Bell's palsy: Secondary | ICD-10-CM | POA: Diagnosis not present

## 2014-11-01 LAB — CBC
HCT: 39.6 % (ref 36.0–46.0)
HEMOGLOBIN: 13.6 g/dL (ref 12.0–15.0)
MCH: 29.8 pg (ref 26.0–34.0)
MCHC: 34.3 g/dL (ref 30.0–36.0)
MCV: 86.8 fL (ref 78.0–100.0)
Platelets: 323 10*3/uL (ref 150–400)
RBC: 4.56 MIL/uL (ref 3.87–5.11)
RDW: 13.6 % (ref 11.5–15.5)
WBC: 7.5 10*3/uL (ref 4.0–10.5)

## 2014-11-01 LAB — BRAIN NATRIURETIC PEPTIDE: B NATRIURETIC PEPTIDE 5: 56 pg/mL (ref 0.0–100.0)

## 2014-11-01 LAB — APTT: APTT: 33 s (ref 24–37)

## 2014-11-01 MED ORDER — ADULT MULTIVITAMIN W/MINERALS CH
1.0000 | ORAL_TABLET | Freq: Every day | ORAL | Status: DC
  Administered 2014-11-01: 1 via ORAL
  Filled 2014-11-01: qty 1

## 2014-11-01 MED ORDER — LORAZEPAM 2 MG/ML IJ SOLN
1.0000 mg | Freq: Once | INTRAMUSCULAR | Status: DC
  Filled 2014-11-01: qty 1

## 2014-11-01 MED ORDER — PREDNISONE 20 MG PO TABS: 60.0000 mg | ORAL_TABLET | Freq: Every day | ORAL | Status: DC

## 2014-11-01 MED ORDER — POLYVINYL ALCOHOL 1.4 % OP SOLN: 2.0000 [drp] | OPHTHALMIC | Status: DC | PRN

## 2014-11-01 MED ORDER — OMEGA-3-ACID ETHYL ESTERS 1 G PO CAPS
1.0000 g | ORAL_CAPSULE | Freq: Every day | ORAL | Status: DC
  Administered 2014-11-01: 1 g via ORAL
  Filled 2014-11-01: qty 1

## 2014-11-01 MED ORDER — PREDNISONE 50 MG PO TABS
50.0000 mg | ORAL_TABLET | Freq: Every day | ORAL | Status: DC
  Administered 2014-11-01: 50 mg via ORAL
  Filled 2014-11-01 (×2): qty 1

## 2014-11-01 MED ORDER — ACYCLOVIR 200 MG PO CAPS
400.0000 mg | ORAL_CAPSULE | Freq: Four times a day (QID) | ORAL | Status: DC
  Administered 2014-11-01 (×2): 400 mg via ORAL
  Filled 2014-11-01 (×4): qty 2

## 2014-11-01 MED ORDER — LORATADINE 10 MG PO TABS: 10.0000 mg | ORAL_TABLET | Freq: Every day | ORAL | Status: DC | PRN

## 2014-11-01 MED ORDER — VITAMIN D 1000 UNITS PO TABS
1000.0000 [IU] | ORAL_TABLET | Freq: Every day | ORAL | Status: DC
  Administered 2014-11-01: 1000 [IU] via ORAL
  Filled 2014-11-01: qty 1

## 2014-11-01 MED ORDER — MULTI-VITAMIN/MINERALS PO TABS: 1.0000 | ORAL_TABLET | Freq: Every day | ORAL | Status: DC

## 2014-11-01 MED ORDER — SODIUM CHLORIDE 0.9 % IJ SOLN
3.0000 mL | Freq: Two times a day (BID) | INTRAMUSCULAR | Status: DC
  Administered 2014-11-01 (×2): 3 mL via INTRAVENOUS

## 2014-11-01 MED ORDER — VALACYCLOVIR HCL 1 G PO TABS: 1000.0000 mg | ORAL_TABLET | Freq: Three times a day (TID) | ORAL | Status: DC

## 2014-11-01 MED ORDER — ATENOLOL 25 MG PO TABS: 50.0000 mg | ORAL_TABLET | Freq: Every evening | ORAL | Status: DC

## 2014-11-01 MED ORDER — IBUPROFEN 200 MG PO TABS: 200.0000 mg | ORAL_TABLET | Freq: Four times a day (QID) | ORAL | Status: DC | PRN

## 2014-11-01 MED ORDER — HYDRALAZINE HCL 20 MG/ML IJ SOLN: 5.0000 mg | INTRAMUSCULAR | Status: DC | PRN

## 2014-11-01 MED ORDER — POLYVINYL ALCOHOL 1.4 % OP SOLN
2.0000 [drp] | OPHTHALMIC | Status: DC | PRN
  Filled 2014-11-01: qty 15

## 2014-11-01 MED ORDER — CALCIUM CARBONATE 1250 (500 CA) MG PO TABS
1.0000 | ORAL_TABLET | Freq: Every day | ORAL | Status: DC
  Administered 2014-11-01: 500 mg via ORAL
  Filled 2014-11-01: qty 1

## 2014-11-01 MED ORDER — SIMVASTATIN 20 MG PO TABS
20.0000 mg | ORAL_TABLET | Freq: Every day | ORAL | Status: DC
  Administered 2014-11-01: 20 mg via ORAL
  Filled 2014-11-01: qty 1

## 2014-11-01 MED ORDER — ASPIRIN 81 MG PO CHEW
81.0000 mg | CHEWABLE_TABLET | Freq: Every day | ORAL | Status: DC
  Administered 2014-11-01: 81 mg via ORAL
  Filled 2014-11-01: qty 1

## 2014-11-01 MED ORDER — ASPIRIN 81 MG PO TABS: 81.0000 mg | ORAL_TABLET | Freq: Every day | ORAL | Status: DC

## 2014-11-01 MED ORDER — OMEGA-3 FATTY ACIDS 1000 MG PO CAPS: 1200.0000 mg | ORAL_CAPSULE | Freq: Every day | ORAL | Status: DC

## 2014-11-01 MED ORDER — ALUM & MAG HYDROXIDE-SIMETH 200-200-20 MG/5ML PO SUSP: 30.0000 mL | Freq: Four times a day (QID) | ORAL | Status: DC | PRN

## 2014-11-01 MED ORDER — SENNOSIDES-DOCUSATE SODIUM 8.6-50 MG PO TABS: 1.0000 | ORAL_TABLET | Freq: Every evening | ORAL | Status: DC | PRN

## 2014-11-01 MED ORDER — HEPARIN SODIUM (PORCINE) 5000 UNIT/ML IJ SOLN
5000.0000 [IU] | Freq: Three times a day (TID) | INTRAMUSCULAR | Status: DC
  Administered 2014-11-01: 5000 [IU] via SUBCUTANEOUS
  Filled 2014-11-01: qty 1

## 2014-11-01 NOTE — Evaluation (Addendum)
Physical Therapy Evaluation and Discharge Patient Details Name: AALIAH JORGENSON MRN: [id number] DOB: 1950-06-08 Today's Date: 11/01/2014   History of Present Illness  64 y.o. female with PMH of hypertension, hyperlipidemia, osteoarthritis, CAD (s/p of CABG 1996), OSA on CPAP, dCHF (EF 60-65 with grade 1 diastolic dysfunction), who presents with right facial droop and difficult opening right eye.  Clinical Impression  Patient evaluated by Physical Therapy with no further acute PT needs identified. All education has been completed and the patient has no further questions. Functionally, pt doing well with reported history of BIL knee OA. Mild antalgic gait pattern however no loss of balance, change in sensation, or focal weakness noted throughout LEs. Normal light touch sensation to face BIL. She does report blurred vision, even with reading glasses which is not her reported baseline. See below for any follow-up Physial Therapy or equipment needs. PT is signing off. Thank you for this referral.     Follow Up Recommendations Outpatient PT    Equipment Recommendations  None recommended by PT    Recommendations for Other Services       Precautions / Restrictions Precautions Precautions: None Restrictions Weight Bearing Restrictions: No      Mobility  Bed Mobility Overal bed mobility: Independent                Transfers Overall transfer level: Modified independent               General transfer comment: extra time  Ambulation/Gait Ambulation/Gait assistance: Modified independent (Device/Increase time) Ambulation Distance (Feet): 100 Feet Assistive device: None Gait Pattern/deviations: Step-through pattern;Antalgic Gait velocity: decreased   General Gait Details: Mildly antalgic gait pattern. No overt loss of balance noted during dynamic gait challenges which included high marching, quick turns, variable speeds, horizontal and vertical head turns, and backwards  stepping.  Stairs            Wheelchair Mobility    Modified Rankin (Stroke Patients Only) Modified Rankin (Stroke Patients Only) Pre-Morbid Rankin Score: No symptoms Modified Rankin: No significant disability     Balance Overall balance assessment: Independent                                           Pertinent Vitals/Pain Pain Assessment: No/denies pain    Home Living Family/patient expects to be discharged to:: Private residence Living Arrangements: Children Available Help at Discharge: Family Type of Home: House                Prior Function Level of Independence: Independent         Comments: Hx of knee pain with reported OA BIL. States she had an "injection" to Lt knee a couple of weeks ago.     Hand Dominance   Dominant Hand: Right    Extremity/Trunk Assessment   Upper Extremity Assessment: Defer to OT evaluation           Lower Extremity Assessment: Overall WFL for tasks assessed (Lt knee pain with resistance during MMT)         Communication   Communication: No difficulties  Cognition Arousal/Alertness: Awake/alert Behavior During Therapy: WFL for tasks assessed/performed Overall Cognitive Status: Within Functional Limits for tasks assessed                      General Comments General comments (skin integrity, edema, etc.): Reviewed signs/symptoms  of CVA using FAST acronym. Also discussed follow-up options for possible Bell's Palsy.    Exercises        Assessment/Plan    PT Assessment Patent does not need any further PT services  PT Diagnosis Other (comment) (Rt facial weakness)   PT Problem List    PT Treatment Interventions     PT Goals (Current goals can be found in the Care Plan section) Acute Rehab PT Goals Patient Stated Goal: None stated PT Goal Formulation: All assessment and education complete, DC therapy    Frequency     Barriers to discharge        Co-evaluation                End of Session   Activity Tolerance: Patient tolerated treatment well Patient left: in bed;with call bell/phone within reach Nurse Communication: Mobility status    Functional Assessment Tool Used: clinical observation Functional Limitation: Mobility: Walking and moving around Mobility: Walking and Moving Around Current Status [phone]): 0 percent impaired, limited or restricted Mobility: Walking and Moving Around Goal Status [phone]): 0 percent impaired, limited or restricted Mobility: Walking and Moving Around Discharge Status [phone]): 0 percent impaired, limited or restricted    Time: 1253-1307 PT Time Calculation (min) (ACUTE ONLY): 14 min   Charges:   PT Evaluation $Initial PT Evaluation Tier I: 1 Procedure     PT G Codes:   PT G-Codes **NOT FOR INPATIENT CLASS** Functional Assessment Tool Used: clinical observation Functional Limitation: Mobility: Walking and moving around Mobility: Walking and Moving Around Current Status ([id number]): 0 percent impaired, limited or restricted Mobility: Walking and Moving Around Goal Status ([id number]): 0 percent impaired, limited or restricted Mobility: Walking and Moving Around Discharge Status [phone]): 0 percent impaired, limited or restricted    Ellouise Newer 11/01/2014, 2:41 PM [place], [place]

## 2014-11-01 NOTE — Progress Notes (Signed)
Patient arrived to 4N28 AAOx4 with no complaints. Slight facial droop on the right side. Vitals taken and tele placed. Will continue to monitor. Candus Braud, Rande Brunt, RN

## 2014-11-01 NOTE — Progress Notes (Signed)
RT set up CPAP for patient on auto with nasal mask. Patient tolerating well at this time.

## 2014-11-01 NOTE — Progress Notes (Signed)
Utilization Review Completed.Kathryn Castillo T7/13/2016  

## 2014-11-01 NOTE — ED Notes (Signed)
Pt transported to MRI 

## 2014-11-01 NOTE — ED Notes (Signed)
MRI called- pt having anxiety about MRI. Admitting MD called- verbal order for ativan 1mg .

## 2014-11-01 NOTE — Discharge Summary (Signed)
Physician Discharge Summary  ICEL CASTLES VQX:[id number] DOB: [birth year] DOA: 10/31/2014  PCP: Kandice Hams, MD  Admit date: 10/31/2014 Discharge date: 11/01/2014  Time spent: 60 minutes  Recommendations for Outpatient Follow-up:  1. Follow-up with Kandice Hams, MD in 1 week.  Discharge Diagnoses:  Principal Problem:   Bell's palsy Active Problems:   Hyperlipidemia   Obstructive sleep apnea   Essential hypertension   Coronary atherosclerosis   Chronic diastolic heart failure   Discharge Condition: Stable and improved  Diet recommendation: Heart healthy  Filed Weights   11/01/14 0153  Weight: 104.101 kg (229 lb 8 oz)    History of present illness:  Per Dr.Niu Keirra B Vajda is a 64 y.o. female with PMH of hypertension, hyperlipidemia, osteoarthritis, CAD (s/p of CABG 1996), OSA on CPAP, dCHF (EF 60-65 with grade 1 diastolic dysfunction), who presented with right facial droop and difficult opening right eye.  Patient reported that at about 2 PM on the day of admission, she started feeling weak in her right face. She felt left eye fatigue throughout the day at work, with mild blurry vision and tension behind her left occular area. Her daughter noticed that " the left eye was struggling to keep open but the right face was saggy". Currently patient denies fever, chills, fatigue, running nose, ear pain, headaches, cough, chest pain, SOB, abdominal pain, diarrhea, constipation, dysuria, urgency, frequency, hematuria, skin rashes, joint pain or leg swelling. No unilateral weakness, numbness or tingling sensations. No hearing loss.  In ED, patient was found to have WBC 8.3, negative troponin, and heart failure 0.90, temperature normal, no tachycardia, electrolytes okay. CT head is negative for acute abnormalities. Patient was admitted to inpatient for further evaluation and treatment. Neurology was consulted by ED  Hospital Course:  #1 Bell's palsy Patient was admitted with  symptoms consistent with a Bell's palsy. However given his risk factors of hypertension hyperlipidemia coronary artery disease patient was admitted for stroke rule out. Head CT which was done was negative. MRI of the head was subsequently done which came back negative. Patient was seen in consultation by Dr. Aram Beecham neurology who had recommended that patient be treated with acyclovir 1 week as well as prednisone 1 week would likely need to follow-up with PCP as outpatient. As patient had ruled out for stroke through MRI no further stroke workup was done. Patient was subsequently discharged home on Valtrex 1 g 3 times daily 1 week, as well as prednisone 60 mg daily 1 week. Patient will follow-up with PCP as outpatient.  #2 coronary artery disease status post CABG Remained stable throughout the hospitalization. Cardiac enzymes were negative. Patient was asymptomatic. Patient was maintained on a home regimen of aspirin and atenolol.  #3 hyperlipidemia Patient was maintained on a home regimen of Zocor and fish oil.  #4 diastolic CHF 2-D echo done 10/17/2014 had a EF of 60-65% with grade 1 diastolic dysfunction. Patient remained euvolemic throughout the hospitalization. Patient was maintained on a home regimen of aspirin and atenolol. Outpatient follow-up.  #5 hypertension  remained stable throughout the hospitalization.  #6 obstructive sleep apnea Patient was maintained on CPAP during the hospitalization.  Procedures:  CT head 10/31/2014  MRI head 11/01/2014  Consultations:  Neurology: Dr. Aram Beecham 10/31/2014  Discharge Exam: Filed Vitals:   11/01/14 0956  BP: 156/77  Pulse: 74  Temp: 98.1 F (36.7 C)  Resp: 20    General: NAD Cardiovascular: RRR Respiratory: CTAB  Discharge Instructions   Discharge Instructions  Diet - low sodium heart healthy    Complete by:  As directed      Discharge instructions    Complete by:  As directed   May place eye patch to right eye at  bedtime. Follow up with Kandice Hams, MD in 1 week.     Increase activity slowly    Complete by:  As directed           Current Discharge Medication List    START taking these medications   Details  polyvinyl alcohol (LIQUIFILM TEARS) 1.4 % ophthalmic solution Place 2 drops into the right eye as needed for dry eyes. Qty: 15 mL, Refills: 0    predniSONE (DELTASONE) 20 MG tablet Take 3 tablets (60 mg total) by mouth daily with breakfast. Take for 1 week, then stop. Qty: 21 tablet, Refills: 0    valACYclovir (VALTREX) 1000 MG tablet Take 1 tablet (1,000 mg total) by mouth 3 (three) times daily. Take for 1 week, then stop. Qty: 21 tablet, Refills: 0      CONTINUE these medications which have NOT CHANGED   Details  aspirin 81 MG tablet Take 81 mg by mouth at bedtime.     atenolol (TENORMIN) 50 MG tablet Take 1 tablet (50 mg total) by mouth every evening.    calcium carbonate (OS-CAL - DOSED IN MG OF ELEMENTAL CALCIUM) 1250 MG tablet Take 1 tablet by mouth daily.    cephALEXin (KEFLEX) 500 MG capsule Take 500 mg by mouth as needed (takes as needed for boils).    cholecalciferol (VITAMIN D) 1000 UNITS tablet Take 1,000 Units by mouth daily.    fish oil-omega-3 fatty acids 1000 MG capsule Take 1,200 mg by mouth daily.    ibuprofen (ADVIL,MOTRIN) 200 MG tablet Take 200 mg by mouth every 6 (six) hours as needed for mild pain or moderate pain.    loratadine (CLARITIN) 10 MG tablet Take 10 mg by mouth daily as needed for allergies.    Multiple Vitamins-Minerals (MULTIVITAMIN WITH MINERALS) tablet Take 1 tablet by mouth daily.    naproxen sodium (ANAPROX) 220 MG tablet Take 220 mg by mouth 2 (two) times daily as needed (pain).    simvastatin (ZOCOR) 20 MG tablet Take 20 mg by mouth at bedtime.      STOP taking these medications     hydrochlorothiazide (MICROZIDE) 12.5 MG capsule        Allergies  Allergen Reactions  . Sulfamethoxazole-Trimethoprim Other (See Comments)     REACTION: "DON'T REMEMBER"   Follow-up Information    Follow up with POLITE,RONALD D, MD. Schedule an appointment as soon as possible for a visit in 1 week.   Specialty:  Internal Medicine   Contact information:   [address] [place] [address] [phone]        The results of significant diagnostics from this hospitalization (including imaging, microbiology, ancillary and laboratory) are listed below for reference.    Significant Diagnostic Studies: Ct Head Wo Contrast  10/31/2014   CLINICAL DATA:  Initial evaluation for acute right facial droop.  EXAM: CT HEAD WITHOUT CONTRAST  TECHNIQUE: Contiguous axial images were obtained from the base of the skull through the vertex without intravenous contrast.  COMPARISON:  None.  FINDINGS: Mild diffuse prominence of the CSF containing spaces is compatible with generalized age-related cerebral atrophy. Patchy and confluent hypodensity within the periventricular and deep white matter of both cerebral hemispheres most consistent with chronic small vessel ischemic changes.  No  acute large vessel territory infarct identified. No acute intracranial hemorrhage.  No mass lesion, midline shift, or mass effect. No hydrocephalus. No extra-axial fluid collection.  Scalp soft tissues within normal limits. No acute abnormality about the orbits.  Calvarium intact. Paranasal sinuses and mastoid air cells are well pneumatized and free of fluid.  IMPRESSION: 1. No acute intracranial process identified. 2. Generalized age-related cerebral atrophy with moderate chronic microvascular ischemic disease.   Electronically Signed   By: Jeannine Boga M.D.   On: 10/31/2014 22:52   Mr Brain Wo Contrast  11/01/2014   CLINICAL DATA:  Initial evaluation for acute onset right facial weakness.  EXAM: MRI HEAD WITHOUT CONTRAST  TECHNIQUE: Multiplanar, multiecho pulse sequences of the brain and surrounding structures were obtained without intravenous  contrast.  COMPARISON:  Prior CT from 10/31/2014  FINDINGS: Mild diffuse prominence of the CSF containing spaces is compatible with generalized cerebral atrophy. Patchy and confluent T2/FLAIR hyperintensity within the periventricular and deep white matter both cerebral hemispheres, most compatible with moderate chronic microvascular ischemic changes.  No abnormal foci of restricted diffusion to suggest acute intracranial infarct. Gray-white matter differentiation maintained. Normal intravascular flow voids are preserved. No acute or chronic intracranial hemorrhage.  No mass lesion, midline shift, or mass effect. Ventricles are normal in size without evidence hydrocephalus. No extra-axial fluid collection.  Craniocervical junction within normal limits. Visualized upper cervical spine unremarkable. Pituitary gland within normal limits. No acute abnormality about the orbits.  Paranasal sinuses are clear. Minimal scattered opacity within the mastoid air cells bilaterally, right greater than left. Inner ear structures within normal limits.  Bone marrow signal intensity within normal limits. Scalp soft tissues unremarkable.  IMPRESSION: 1. No acute intracranial infarct or other abnormality identified. 2. Generalized age-related cerebral atrophy with moderate chronic microvascular ischemic disease.   Electronically Signed   By: Jeannine Boga M.D.   On: 11/01/2014 02:14    Microbiology: No results found for this or any previous visit (from the past 240 hour(s)).   Labs: Basic Metabolic Panel:  Recent Labs Lab 10/31/14 2223  NA 136  K 3.6  CL 103  CO2 21*  GLUCOSE 139*  BUN 15  CREATININE 0.98  CALCIUM 9.4   Liver Function Tests:  Recent Labs Lab 10/31/14 2223  AST 50*  ALT 58*  ALKPHOS 92  BILITOT 0.6  PROT 7.4  ALBUMIN 3.9   No results for input(s): LIPASE, AMYLASE in the last 168 hours. No results for input(s): AMMONIA in the last 168 hours. CBC:  Recent Labs Lab 10/31/14 2223  11/01/14 0354  WBC 8.3 7.5  NEUTROABS 3.4  --   HGB 14.4 13.6  HCT 42.0 39.6  MCV 85.2 86.8  PLT 325 323   Cardiac Enzymes: No results for input(s): CKTOTAL, CKMB, CKMBINDEX, TROPONINI in the last 168 hours. BNP: BNP (last 3 results)  Recent Labs  11/01/14 0354  BNP 56.0    ProBNP (last 3 results) No results for input(s): PROBNP in the last 8760 hours.  CBG: No results for input(s): GLUCAP in the last 168 hours.     SignedIrine Seal MD [facility] 11/01/2014, 1:16 PM

## 2014-11-01 NOTE — Progress Notes (Signed)
Chaplain responded to page for completion of AD. AD completed and copy placed in chart. Chaplain offered to keep pt in prayer. Gesture appreciated.    11/01/14 1200  Clinical Encounter Type  Visited With Patient  Visit Type Follow-up;Spiritual support  Spiritual Encounters  Spiritual Needs Prayer  Advance Directives (For Healthcare)  Does patient have an advance directive? Yes  Type of Par[facility]ic of [place];Living will  Copy of advanced directive(s) in chart? Yes  StameyBarbette Hair, Chaplain 11/01/2014 12:17 PM

## 2014-11-01 NOTE — Progress Notes (Signed)
Discharge orders received. Pt and daughter educated on discharge instructions. Pt verbalized understanding. Pt given discharge packet and prescriptions. IV and tele removed. Writer tried to make appointment with pt's PCP but only reached voicemail. Pt's daughter called PCP and left voicemail for office to call pt back. Pt taken downstairs by staff via wheelchair.

## 2014-11-01 NOTE — Progress Notes (Signed)
Chaplain responded to spiritual care consult for advanced directive. Chaplain explained contents and pt began filling out paperwork.  Pt has visitor at this time. Chaplain requests for page when paper work is completed or pt has questions. [phone]   11/01/14 0900  Clinical Encounter Type  Visited With Patient  Visit Type Initial;Spiritual support  Advance Directives (For Healthcare)  Does patient have an advance directive? No  Would patient like information on creating an advanced directive? Yes - Educational materials given  Marcelino Scot 11/01/2014 9:55 AM

## 2015-01-17 ENCOUNTER — Other Ambulatory Visit: Payer: Self-pay

## 2015-01-17 DIAGNOSIS — Z1231 Encounter for screening mammogram for malignant neoplasm of breast: Secondary | ICD-10-CM

## 2015-01-22 ENCOUNTER — Ambulatory Visit
Admission: RE | Admit: 2015-01-22 | Discharge: 2015-01-22 | Disposition: A | Discharge status: Home / Self Care | Payer: BLUE CROSS/BLUE SHIELD | Source: Ambulatory Visit

## 2015-01-22 DIAGNOSIS — Z1231 Encounter for screening mammogram for malignant neoplasm of breast: Secondary | ICD-10-CM

## 2015-01-24 ENCOUNTER — Other Ambulatory Visit: Payer: Self-pay | Admitting: Internal Medicine

## 2015-01-24 DIAGNOSIS — R928 Other abnormal and inconclusive findings on diagnostic imaging of breast: Secondary | ICD-10-CM

## 2015-02-05 ENCOUNTER — Ambulatory Visit
Admission: RE | Admit: 2015-02-05 | Discharge: 2015-02-05 | Disposition: A | Discharge status: Home / Self Care | Payer: BLUE CROSS/BLUE SHIELD | Source: Ambulatory Visit | Attending: Internal Medicine | Admitting: Internal Medicine

## 2015-02-05 DIAGNOSIS — R928 Other abnormal and inconclusive findings on diagnostic imaging of breast: Secondary | ICD-10-CM

## 2015-03-26 DIAGNOSIS — Z85828 Personal history of other malignant neoplasm of skin: Secondary | ICD-10-CM | POA: Diagnosis not present

## 2015-03-26 DIAGNOSIS — D235 Other benign neoplasm of skin of trunk: Secondary | ICD-10-CM | POA: Diagnosis not present

## 2015-03-26 DIAGNOSIS — L439 Lichen planus, unspecified: Secondary | ICD-10-CM | POA: Diagnosis not present

## 2015-03-26 DIAGNOSIS — D485 Neoplasm of uncertain behavior of skin: Secondary | ICD-10-CM | POA: Diagnosis not present

## 2015-05-10 ENCOUNTER — Ambulatory Visit (INDEPENDENT_AMBULATORY_CARE_PROVIDER_SITE_OTHER): Payer: Medicare Other | Admitting: Podiatry

## 2015-05-10 ENCOUNTER — Encounter: Payer: Self-pay | Admitting: Podiatry

## 2015-05-10 ENCOUNTER — Ambulatory Visit (INDEPENDENT_AMBULATORY_CARE_PROVIDER_SITE_OTHER): Payer: Medicare Other

## 2015-05-10 VITALS — BP 139/84 | HR 51 | Resp 16

## 2015-05-10 DIAGNOSIS — M7751 Other enthesopathy of right foot: Secondary | ICD-10-CM

## 2015-05-10 DIAGNOSIS — M2010 Hallux valgus (acquired), unspecified foot: Secondary | ICD-10-CM

## 2015-05-10 DIAGNOSIS — M778 Other enthesopathies, not elsewhere classified: Secondary | ICD-10-CM

## 2015-05-10 DIAGNOSIS — M779 Enthesopathy, unspecified: Secondary | ICD-10-CM

## 2015-05-10 NOTE — Progress Notes (Signed)
   Subjective:    Patient ID: Kathryn Castillo, female    DOB: 04-26-1950, 65 y.o.   MRN: [id number]  HPI: She presents today with a chief complaint of pain for duration of several years worse recently overlying the first metatarsophalangeal joint of the left foot. She states this is becoming almost impossible to deal with particularly when she is wearing shoe gear. She states that if there is any pressure on the first metatarsophalangeal joint it is extremely painful. She denies trauma to this area. She is also complaining of pain to the dorsal aspect of the right foot for several years again without trauma. No treatments other than shoe gear changes.    Review of Systems  HENT: Positive for sinus pressure.   Musculoskeletal: Positive for arthralgias.       Objective:   Physical Exam: 65 year old female vital signs stable alert and oriented 3 presents with a chief complaint of painful bunion left and painful dorsal aspect of the right foot. No apparent distress. Pulses are strong and palpable bilateral. Neurologic sensorium is intact bilateral. Deep tendon reflexes are brisk and equal bilateral. Muscle strength +5 over 5 dorsiflexion plantar flexors and inverters and everters all intrinsic musculature is intact. Orthopedic evaluation demonstrates all joints distal to the ankle have full range of motion without crepitation. She does have mild limitation on range of motion of the first metatarsophalangeal joint of the left foot with pain on palpation of the medial aspect of the first metatarsophalangeal joint. There is an obvious bunion deformity here and the deformity is reducible. It appears to be tracking but not yet track bound. She also has pain on palpation of dorsal aspect of the right foot with pain on frontal plane range of motion of Lisfranc's joints. Bilateral radiographs taken today do demonstrate osteoarthritic changes right foot Lisfranc joints. No lesions in the bone and no fractures.  Left foot does demonstrate an increase in the first metatarsal angle greater than normal values with a prominent hypertrophic medial condyle to the head of the first metatarsal. Hallux abductus angle is greater than normal value. Cutaneous evaluation and straight supple well-hydrated cutis no erythema edema saline as drainage or odor with the exception of mild erythema overlying the first metatarsophalangeal joint medially.        Assessment & Plan:  Left foot demonstrates hallux valgus deformity. Right foot demonstrates capsulitis dorsal aspect Lisfranc's joints.  Plan: Discussed etiology pathology conservative versus surgical therapies. At this point I injected the dorsal aspect of the right foot with Kenalog and local anesthetic. She tolerated this procedure well. We then discussed surgical correction of the first metatarsophalangeal joint of the left foot in great detail. We consented her for surgical repair with an [facility] bunion repair and screw fixation. We discussed the possible postop complications which may include but are not limited to postop pain bleeding swelling infection recurrence need for further surgery overcorrection under correction loss of digit loss of limb loss of life. We also discussed the pros and cons of surgery. She was dispensed a postop cam walker. Follow up with her in the near future.

## 2015-05-10 NOTE — Patient Instructions (Signed)
Pre-Operative Instructions  Congratulations, you have decided to take an important step to improving your quality of life.  You can be assured that the doctors of [facility] will be with you every step of the way.  1. Plan to be at the surgery center/hospital at least 1 (one) hour prior to your scheduled time unless otherwise directed by the surgical center/hospital staff.  You must have a responsible adult accompany you, remain during the surgery and drive you home.  Make sure you have directions to the surgical center/hospital and know how to get there on time. 2. For hospital based surgery you will need to obtain a history and physical form from your family physician within 1 month prior to the date of surgery- we will give you a form for you primary physician.  3. We make every effort to accommodate the date you request for surgery.  There are however, times where surgery dates or times have to be moved.  We will contact you as soon as possible if a change in schedule is required.   4. No Aspirin/Ibuprofen for one week before surgery.  If you are on aspirin, any non-steroidal anti-inflammatory medications (Mobic, Aleve, Ibuprofen) you should stop taking it 7 days prior to your surgery.  You make take Tylenol  For pain prior to surgery.  5. Medications- If you are taking daily heart and blood pressure medications, seizure, reflux, allergy, asthma, anxiety, pain or diabetes medications, make sure the surgery center/hospital is aware before the day of surgery so they may notify you which medications to take or avoid the day of surgery. 6. No food or drink after midnight the night before surgery unless directed otherwise by surgical center/hospital staff. 7. No alcoholic beverages 24 hours prior to surgery.  No smoking 24 hours prior to or 24 hours after surgery. 8. Wear loose pants or shorts- loose enough to fit over bandages, boots, and casts. 9. No slip on shoes, sneakers are best. 10. Bring  your boot with you to the surgery center/hospital.  Also bring crutches or a walker if your physician has prescribed it for you.  If you do not have this equipment, it will be provided for you after surgery. 11. If you have not been contracted by the surgery center/hospital by the day before your surgery, call to confirm the date and time of your surgery. 12. Leave-time from work may vary depending on the type of surgery you have.  Appropriate arrangements should be made prior to surgery with your employer. 13. Prescriptions will be provided immediately following surgery by your doctor.  Have these filled as soon as possible after surgery and take the medication as directed. 14. Remove nail polish on the operative foot. 15. Wash the night before surgery.  The night before surgery wash the foot and leg well with the antibacterial soap provided and water paying special attention to beneath the toenails and in between the toes.  Rinse thoroughly with water and dry well with a towel.  Perform this wash unless told not to do so by your physician.  Enclosed: 1 Ice pack (please put in freezer the night before surgery)   1 Hibiclens skin cleaner   Pre-op Instructions  If you have any questions regarding the instructions, do not hesitate to call our office.  [place]: [address] [place], [place] [address] [phone]  [place]: [address] [place], [place] [address] [phone]  [place]: [address]  [place],  [address] [phone]  Dr. Richard   Tuchman DPM, Dr. Norman Regal DPM Dr. Richard Sikora DPM, Dr. M. Todd Hyatt DPM, Dr. Kathryn Egerton DPM 

## 2015-05-17 ENCOUNTER — Telehealth: Payer: Self-pay | Admitting: *Deleted

## 2015-05-17 NOTE — Telephone Encounter (Signed)
I'm calling to let you know Dr. Milinda Pointer had a cancellation for 05/25/2015.  Would you like to reschedule your surgery from 06/15/15 to this date?  "I would love to.  Go ahead and get it over with.".  I left a message for Caren Griffins at the surgical center to reschedule surgery from 06/15/2015 to 05/25/2015.

## 2015-05-24 ENCOUNTER — Other Ambulatory Visit: Payer: Self-pay | Admitting: Podiatry

## 2015-05-24 MED ORDER — CEPHALEXIN 500 MG PO CAPS: 500.0000 mg | ORAL_CAPSULE | Freq: Three times a day (TID) | ORAL | Status: DC

## 2015-05-24 MED ORDER — OXYCODONE-ACETAMINOPHEN 10-325 MG PO TABS: 1.0000 | ORAL_TABLET | Freq: Four times a day (QID) | ORAL | Status: DC | PRN

## 2015-05-24 MED ORDER — PROMETHAZINE HCL 25 MG PO TABS: 25.0000 mg | ORAL_TABLET | Freq: Three times a day (TID) | ORAL | Status: DC | PRN

## 2015-05-25 ENCOUNTER — Encounter: Payer: Self-pay | Admitting: Podiatry

## 2015-05-25 DIAGNOSIS — M2012 Hallux valgus (acquired), left foot: Secondary | ICD-10-CM | POA: Diagnosis not present

## 2015-05-25 DIAGNOSIS — M25572 Pain in left ankle and joints of left foot: Secondary | ICD-10-CM | POA: Diagnosis not present

## 2015-05-25 DIAGNOSIS — M2021 Hallux rigidus, right foot: Secondary | ICD-10-CM | POA: Diagnosis not present

## 2015-05-25 DIAGNOSIS — I10 Essential (primary) hypertension: Secondary | ICD-10-CM | POA: Diagnosis not present

## 2015-05-25 DIAGNOSIS — L600 Ingrowing nail: Secondary | ICD-10-CM | POA: Diagnosis not present

## 2015-06-01 ENCOUNTER — Encounter: Payer: Self-pay | Admitting: Podiatry

## 2015-06-01 ENCOUNTER — Ambulatory Visit (INDEPENDENT_AMBULATORY_CARE_PROVIDER_SITE_OTHER): Payer: Medicare Other | Admitting: Podiatry

## 2015-06-01 ENCOUNTER — Ambulatory Visit (INDEPENDENT_AMBULATORY_CARE_PROVIDER_SITE_OTHER): Payer: Medicare Other

## 2015-06-01 VITALS — BP 150/61 | HR 56 | Resp 16

## 2015-06-01 DIAGNOSIS — Z9889 Other specified postprocedural states: Secondary | ICD-10-CM

## 2015-06-01 DIAGNOSIS — M2010 Hallux valgus (acquired), unspecified foot: Secondary | ICD-10-CM

## 2015-06-01 DIAGNOSIS — M2012 Hallux valgus (acquired), left foot: Secondary | ICD-10-CM

## 2015-06-04 NOTE — Progress Notes (Signed)
Subjective:     Patient ID: Kathryn Castillo, female   DOB: 1950/12/31, 65 y.o.   MRN: [id number]  HPI patient presents one week after having McBride-type bunionectomy by Dr. Milinda Pointer stating that she is in no distress and able to ambulate on the foot with minimal discomfort   Review of Systems     Objective:   Physical Exam Neurovascular status intact negative Homan sign was noted with well coapted incision site first metatarsal left with good alignment noted and good range of motion    Assessment:     Doing well post bunionectomy left    Plan:     X-ray reviewed and sterile dressing reapplied. Dispensed surgical shoe which she may gradually begin wearing and instructed on continued elevation and reappoint in 2 weeks or earlier if needed  X-ray report indicated satisfactory resection of bone with good alignment of the first metatarsal and good joint condition

## 2015-06-08 ENCOUNTER — Ambulatory Visit (INDEPENDENT_AMBULATORY_CARE_PROVIDER_SITE_OTHER): Payer: Medicare Other | Admitting: Podiatry

## 2015-06-08 DIAGNOSIS — M2010 Hallux valgus (acquired), unspecified foot: Secondary | ICD-10-CM

## 2015-06-08 DIAGNOSIS — Z9889 Other specified postprocedural states: Secondary | ICD-10-CM

## 2015-06-11 DIAGNOSIS — M2010 Hallux valgus (acquired), unspecified foot: Secondary | ICD-10-CM | POA: Insufficient documentation

## 2015-06-11 NOTE — Progress Notes (Signed)
Patient ID: Kathryn Castillo, female   DOB: [birth year], 65 y.o.   MRN: [id number]  Subjective: Kathryn Castillo is a 65 y.o. is seen today in office s/p McBride bunionectomy preformed on 05/25/15. She states that she does not have any pain. She's remaining the surgical shoe. Denies any systemic complaints such as fevers, chills, nausea, vomiting. No calf pain, chest pain, shortness of breath.   Objective: General: No acute distress, AAOx3  DP/PT pulses palpable 2/4, CRT < 3 sec to all digits.  Protective sensation intact. Motor function intact.  Left foot: Incision is well coapted without any evidence of dehiscence. There is no surrounding erythema, ascending cellulitis, fluctuance, crepitus, malodor, drainage/purulence. There is minimal edema around the surgical site. There is no pain along the surgical site. There is no pain with first MTPJ range of motion. No other areas of tenderness to bilateral lower extremities.  No other open lesions or pre-ulcerative lesions.  No pain with calf compression, swelling, warmth, erythema.   Assessment and Plan:  Status post left bunionectomy, doing well with no complications   -Treatment options discussed including all alternatives, risks, and complications -Suture ends were cut. She can start to shower tomorrow. She can also transition to a sneaker as tolerated. Limit activity. Continue compression help with swelling. -Ice/elevation -Pain medication as needed. -Monitor for any clinical signs or symptoms of infection and DVT/PE and directed to call the office immediately should any occur or go to the ER. -Follow-up as scheduled with Dr. Milinda Pointer or sooner if any problems arise. In the meantime, encouraged to call the office with any questions, concerns, change in symptoms.   Celesta Gentile, DPM

## 2015-06-21 ENCOUNTER — Ambulatory Visit (INDEPENDENT_AMBULATORY_CARE_PROVIDER_SITE_OTHER): Payer: Medicare Other | Admitting: Podiatry

## 2015-06-21 ENCOUNTER — Encounter: Payer: Self-pay | Admitting: Podiatry

## 2015-06-21 VITALS — BP 142/78 | HR 60 | Resp 16

## 2015-06-21 DIAGNOSIS — Z9889 Other specified postprocedural states: Secondary | ICD-10-CM

## 2015-06-21 DIAGNOSIS — M2012 Hallux valgus (acquired), left foot: Secondary | ICD-10-CM

## 2015-06-21 NOTE — Progress Notes (Signed)
She presents today one month status post McBride bunion repair left foot. She states that it feels just grade she says she has no more pain and she is so happy.  Objective: Vital signs are stable she is alert and oriented 3 matrixectomy site of the hallux left appears to be healing nicely though there is some fibrous deposition proximally. The incision site over the first metatarsophalangeal joint has gone on to heal 100%.  Assessment: Well-healing surgical foot left.  Plan: I encouraged to soak in Epsom salts and water until the fibrin deposition has resolved. I encouraged her not to get a pedicure until this is taken care of. I will follow-up with her in 1 month if necessary.

## 2015-07-03 NOTE — Progress Notes (Signed)
Patient ID: Kathryn Castillo, female   DOB: [birth year], 65 y.o.   MRN: [id number] Dr Milinda Pointer performed a Wynetta Fines Repair Lt foot on 05/25/15 at [facility]

## 2015-07-10 DIAGNOSIS — D2239 Melanocytic nevi of other parts of face: Secondary | ICD-10-CM | POA: Diagnosis not present

## 2015-07-10 DIAGNOSIS — L821 Other seborrheic keratosis: Secondary | ICD-10-CM | POA: Diagnosis not present

## 2015-07-10 DIAGNOSIS — Z85828 Personal history of other malignant neoplasm of skin: Secondary | ICD-10-CM | POA: Diagnosis not present

## 2015-07-26 ENCOUNTER — Encounter: Payer: Medicare Other | Admitting: Podiatry

## 2015-08-07 DIAGNOSIS — H04123 Dry eye syndrome of bilateral lacrimal glands: Secondary | ICD-10-CM | POA: Diagnosis not present

## 2015-08-07 DIAGNOSIS — H2513 Age-related nuclear cataract, bilateral: Secondary | ICD-10-CM | POA: Diagnosis not present

## 2015-08-07 DIAGNOSIS — H9211 Otorrhea, right ear: Secondary | ICD-10-CM | POA: Diagnosis not present

## 2015-08-07 DIAGNOSIS — H353131 Nonexudative age-related macular degeneration, bilateral, early dry stage: Secondary | ICD-10-CM | POA: Diagnosis not present

## 2015-08-07 DIAGNOSIS — Z9622 Myringotomy tube(s) status: Secondary | ICD-10-CM | POA: Diagnosis not present

## 2015-08-22 ENCOUNTER — Observation Stay (HOSPITAL_COMMUNITY)
Admission: EM | Admit: 2015-08-22 | Discharge: 2015-08-24 | Disposition: A | Discharge status: Home / Self Care | Payer: Medicare Other | Attending: Cardiology | Admitting: Cardiology

## 2015-08-22 ENCOUNTER — Encounter (HOSPITAL_COMMUNITY): Payer: Self-pay | Admitting: Cardiology

## 2015-08-22 ENCOUNTER — Emergency Department (HOSPITAL_COMMUNITY): Payer: Medicare Other

## 2015-08-22 DIAGNOSIS — Z7982 Long term (current) use of aspirin: Secondary | ICD-10-CM | POA: Insufficient documentation

## 2015-08-22 DIAGNOSIS — B373 Candidiasis of vulva and vagina: Secondary | ICD-10-CM | POA: Diagnosis not present

## 2015-08-22 DIAGNOSIS — I110 Hypertensive heart disease with heart failure: Secondary | ICD-10-CM | POA: Diagnosis not present

## 2015-08-22 DIAGNOSIS — R0789 Other chest pain: Secondary | ICD-10-CM | POA: Diagnosis not present

## 2015-08-22 DIAGNOSIS — Z6841 Body Mass Index (BMI) 40.0 and over, adult: Secondary | ICD-10-CM | POA: Diagnosis not present

## 2015-08-22 DIAGNOSIS — Z955 Presence of coronary angioplasty implant and graft: Secondary | ICD-10-CM | POA: Insufficient documentation

## 2015-08-22 DIAGNOSIS — R079 Chest pain, unspecified: Secondary | ICD-10-CM | POA: Diagnosis present

## 2015-08-22 DIAGNOSIS — I5033 Acute on chronic diastolic (congestive) heart failure: Secondary | ICD-10-CM | POA: Diagnosis not present

## 2015-08-22 DIAGNOSIS — I200 Unstable angina: Secondary | ICD-10-CM | POA: Diagnosis not present

## 2015-08-22 DIAGNOSIS — F17200 Nicotine dependence, unspecified, uncomplicated: Secondary | ICD-10-CM | POA: Diagnosis present

## 2015-08-22 DIAGNOSIS — Z87891 Personal history of nicotine dependence: Secondary | ICD-10-CM | POA: Diagnosis not present

## 2015-08-22 DIAGNOSIS — I25110 Atherosclerotic heart disease of native coronary artery with unstable angina pectoris: Secondary | ICD-10-CM | POA: Diagnosis not present

## 2015-08-22 DIAGNOSIS — G4730 Sleep apnea, unspecified: Secondary | ICD-10-CM | POA: Insufficient documentation

## 2015-08-22 DIAGNOSIS — E785 Hyperlipidemia, unspecified: Secondary | ICD-10-CM | POA: Diagnosis not present

## 2015-08-22 DIAGNOSIS — I10 Essential (primary) hypertension: Secondary | ICD-10-CM | POA: Diagnosis present

## 2015-08-22 HISTORY — DX: Occlusion and stenosis of unspecified carotid artery: I65.29

## 2015-08-22 HISTORY — DX: Other specified postprocedural states: Z98.890

## 2015-08-22 LAB — BASIC METABOLIC PANEL
Anion gap: 11 (ref 5–15)
BUN: 7 mg/dL (ref 6–20)
CALCIUM: 9.5 mg/dL (ref 8.9–10.3)
CO2: 23 mmol/L (ref 22–32)
CREATININE: 0.79 mg/dL (ref 0.44–1.00)
Chloride: 106 mmol/L (ref 101–111)
GFR calc non Af Amer: >60 mL/min (ref >60)
Glucose, Bld: 124 mg/dL — ABNORMAL HIGH (ref 65–99)
Potassium: 4.3 mmol/L (ref 3.5–5.1)
SODIUM: 140 mmol/L (ref 135–145)

## 2015-08-22 LAB — I-STAT TROPONIN, ED: TROPONIN I, POC: 0.01 ng/mL (ref 0.00–0.08)

## 2015-08-22 LAB — CBC
HCT: 44 % (ref 36.0–46.0)
Hemoglobin: 14.6 g/dL (ref 12.0–15.0)
MCH: 29.8 pg (ref 26.0–34.0)
MCHC: 33.2 g/dL (ref 30.0–36.0)
MCV: 89.8 fL (ref 78.0–100.0)
PLATELETS: 318 10*3/uL (ref 150–400)
RBC: 4.9 MIL/uL (ref 3.87–5.11)
RDW: 13.2 % (ref 11.5–15.5)
WBC: 8 10*3/uL (ref 4.0–10.5)

## 2015-08-22 MED ORDER — HEPARIN BOLUS VIA INFUSION
4000.0000 [IU] | Freq: Once | INTRAVENOUS | Status: AC
  Administered 2015-08-22: 4000 [IU] via INTRAVENOUS
  Filled 2015-08-22: qty 4000

## 2015-08-22 MED ORDER — HEPARIN (PORCINE) IN NACL 100-0.45 UNIT/ML-% IJ SOLN
1100.0000 [IU]/h | INTRAMUSCULAR | Status: DC
  Administered 2015-08-22: 950 [IU]/h via INTRAVENOUS
  Filled 2015-08-22: qty 250

## 2015-08-22 MED ORDER — ASPIRIN 81 MG PO CHEW
324.0000 mg | CHEWABLE_TABLET | Freq: Once | ORAL | Status: AC
  Administered 2015-08-22: 243 mg via ORAL
  Filled 2015-08-22: qty 4

## 2015-08-22 NOTE — ED Provider Notes (Signed)
CSN: [id number]     Arrival date & time 08/22/15  1917 History   First MD Initiated Contact with Patient 08/22/15 2132     Chief Complaint  Patient presents with  . Chest Pain  . Tingling  . Nausea     (Consider location/radiation/quality/duration/timing/severity/associated sxs/prior Treatment) Patient is a 65 y.o. female presenting with chest pain. The history is provided by the patient. No language interpreter was used.  Chest Pain Pain location:  L chest Pain quality: aching   Pain radiates to:  L arm Pain radiates to the back: no   Pain severity:  Moderate Onset quality:  Sudden Duration:  3 minutes Timing:  Intermittent Progression:  Waxing and waning Chronicity:  New Context: not breathing, no stress and no trauma   Relieved by:  None tried Worsened by:  Nothing tried Ineffective treatments:  None tried Associated symptoms: no abdominal pain   Risk factors: coronary artery disease, high cholesterol and hypertension   Pt reports left sided chest pain that last approx 2 minutes.  Pt reports she had a stent placed in LAD in 1996.  Followed by bypass.  Pt reports no problems since bypass.  Pt reports pain feels like previous chest pain.  Pt reports tightness in her throat.  Pt reports she has had a 14 pound weight gain since Monday  Past Medical History  Diagnosis Date  . Hypertension   . Coronary artery disease   . Sleep apnea     uses a cpap  . Hyperlipemia   . Arthritis   . Wears glasses   . Wears partial dentures     upper partial  . Family history of anesthesia complication     father hallucinates  . Trigger thumb of left hand 09/17/2012   Past Surgical History  Procedure Laterality Date  . Dilation and curettage of uterus  1985  . Tubal ligation    . Knee arthroscopy with patella reconstruction  1968    left  . Coronary artery bypass graft  1996    x1  . Nasal septoplasty w/ turbinoplasty  2008    to help tolerated cpap  . Cardiac catheterization   96,01,10    x3  . Trigger finger release Left 09/17/2012    Procedure: RELEASE TRIGGER FINGER/A-1 PULLEY LEFT THUMB (TENDON SHEATH INCISION);  Surgeon: Johnny Bridge, MD;  Location: [facility];  Service: Orthopedics;  Laterality: Left;   Family History  Problem Relation Age of Onset  . Stroke Mother   . Stroke Father   . Heart attack Father   . Heart disease Father   . Hyperlipidemia Father   . Hypertension Father    Social History  Substance Use Topics  . Smoking status: Former Smoker -- 1.00 packs/day    Quit date: 07/30/2014  . Smokeless tobacco: Not on file  . Alcohol Use: No   OB History    No data available     Review of Systems  Cardiovascular: Positive for chest pain.  Gastrointestinal: Negative for abdominal pain.  All other systems reviewed and are negative.     Allergies  Sulfamethoxazole-trimethoprim  Home Medications   Prior to Admission medications   Medication Sig Start Date End Date Taking? Authorizing Provider  aspirin 81 MG tablet Take 81 mg by mouth at bedtime.    Yes Historical Provider, MD  atenolol (TENORMIN) 50 MG tablet Take 1 tablet (50 mg total) by mouth every evening. 09/12/14  Yes Belva Crome, MD  calcium carbonate (OS-CAL - DOSED IN MG OF ELEMENTAL CALCIUM) 1250 MG tablet Take 1 tablet by mouth daily.   Yes Historical Provider, MD  cholecalciferol (VITAMIN D) 1000 UNITS tablet Take 1,000 Units by mouth daily.   Yes Historical Provider, MD  fish oil-omega-3 fatty acids 1000 MG capsule Take 1,200 mg by mouth daily.   Yes Historical Provider, MD  ibuprofen (ADVIL,MOTRIN) 200 MG tablet Take 200 mg by mouth every 6 (six) hours as needed for mild pain or moderate pain.   Yes Historical Provider, MD  Multiple Vitamins-Minerals (MULTIVITAMIN WITH MINERALS) tablet Take 1 tablet by mouth daily.   Yes Historical Provider, MD  naproxen sodium (ANAPROX) 220 MG tablet Take 220 mg by mouth 2 (two) times daily as needed (pain).   Yes  Historical Provider, MD  polyvinyl alcohol (LIQUIFILM TEARS) 1.4 % ophthalmic solution Place 2 drops into the right eye as needed for dry eyes. 11/01/14  Yes Eugenie Filler, MD  simvastatin (ZOCOR) 20 MG tablet Take 20 mg by mouth at bedtime.   Yes Historical Provider, MD  loratadine (CLARITIN) 10 MG tablet Take 10 mg by mouth daily as needed for allergies.    Historical Provider, MD   BP 141/72 mmHg  Pulse 54  Temp(Src) 97.9 F (36.6 C) (Oral)  Resp 20  Wt 114.76 kg  SpO2 96% Physical Exam  Constitutional: She is oriented to person, place, and time. She appears well-developed and well-nourished.  HENT:  Head: Normocephalic and atraumatic.  Eyes: Conjunctivae and EOM are normal. Pupils are equal, round, and reactive to light.  Neck: Normal range of motion.  Cardiovascular: Normal rate and regular rhythm.   Pulmonary/Chest: Effort normal.  Abdominal: Soft. She exhibits no distension.  Musculoskeletal: She exhibits edema.  Swelling bilat lower extremities  Neurological: She is alert and oriented to person, place, and time.  Skin: Skin is warm.  Psychiatric: She has a normal mood and affect.  Nursing note and vitals reviewed.   ED Course  Procedures (including critical care time) Labs Review Labs Reviewed  BASIC METABOLIC PANEL - Abnormal; Notable for the following:    Glucose, Bld 124 (*)    All other components within normal limits  CBC  I-STAT TROPOININ, ED    Imaging Review Dg Chest 2 View  08/22/2015  CLINICAL DATA:  Left-sided chest pain, nausea, weakness starting today. Bilateral foot swelling, intermittent, for couple of weeks. EXAM: CHEST  2 VIEW COMPARISON:  Chest x-ray dated 10/04/2008. FINDINGS: Median sternotomy wires are stable in alignment. Cardiomediastinal silhouette is stable in size and configuration. Lungs are clear. No pleural effusion or pneumothorax seen. No acute- appearing osseous abnormality seen. IMPRESSION: Lungs are clear and there is no evidence  of acute cardiopulmonary abnormality. Electronically Signed   By: Franki Cabot M.D.   On: 08/22/2015 21:22   I have personally reviewed and evaluated these images and lab results as part of my medical decision-making.   EKG Interpretation   Date/Time:  Wednesday Aug 22 2015 19:22:58 EDT Ventricular Rate:  58 PR Interval:  190 QRS Duration: 96 QT Interval:  440 QTC Calculation: 431 R Axis:   2 Text Interpretation:  Sinus bradycardia Incomplete right bundle branch  block T wave abnormality, consider inferolateral ischemia Abnormal ECG  agree. new t wave abnormality c/w old. Confirmed by Johnney Killian, MD, Jeannie Done  [phone]) on 08/22/2015 9:49:29 PM      MDM Ekg shows t wave changes    Final diagnoses:  Chest pain, unspecified chest pain  type    Dr. Percival Spanish wll see here and evaluate Pt admitted to cardiology    Fransico Meadow, PA-C 08/23/15 0013  Charlesetta Shanks, MD 08/30/15 1340

## 2015-08-22 NOTE — ED Notes (Signed)
PA-C at bedside 

## 2015-08-22 NOTE — Progress Notes (Signed)
ANTICOAGULATION CONSULT NOTE - Initial Consult  Pharmacy Consult for heparin Indication: chest pain/ACS  Allergies  Allergen Reactions  . Sulfamethoxazole-Trimethoprim Other (See Comments)    REACTION: "DON'T REMEMBER"    Patient Measurements: Height: 5' 2.99" (160 cm) Weight: 253 lb 1.4 oz (114.8 kg) IBW/kg (Calculated) : 52.38 Heparin Dosing Weight: 80.3kg  Vital Signs: Temp: 97.9 F (36.6 C) (05/03 1926) Temp Source: Oral (05/03 1926) BP: 141/72 mmHg (05/03 1926) Pulse Rate: 53 (05/03 2145)  Labs:  Recent Labs  08/22/15 2003  HGB 14.6  HCT 44.0  PLT 318  CREATININE 0.79    Estimated Creatinine Clearance: 85.7 mL/min (by C-G formula based on Cr of 0.79).  Assessment: 65 yo f presenting with intermittent left side CP, N, left arm numbness x 2-3 days; gained 14 lbs since Monday  PMH: HTN, CAD, OSA, HLD  AC: none pta. Heparin for ACS r/o  CV: Trop 0.01  Renal: SCr 0.79  Heme: H&H 14.6/44, Plt 318 2 Goal of Therapy:  Heparin level 0.3-0.7 units/ml Monitor platelets by anticoagulation protocol: Yes   Plan:  Heparin bolus 4000 units x 1 Heparin infusion 950 units/hr Daily HL, CBC Monitor for s/sx of bleeding F/U Cards plans  Levester Fresh, PharmD, BCPS, [facility] Clinical Pharmacist Pager [phone] 08/22/2015 10:04 PM

## 2015-08-22 NOTE — ED Notes (Signed)
Cardiology at bedside.

## 2015-08-22 NOTE — H&P (Signed)
CARDIOLOGY ADMISSION NOTE  Patient ID: Kathryn Castillo MRN: [id number] DOB/AGE: 12-11-1950 65 y.o.  Admit date: 08/22/2015 Primary Physician   Kandice Hams, MD Primary Cardiologist   Dr. Tamala Julian Chief Complaint    Chest pain  HPI:  The patient has a history of CAD.  He most recent cath was in 2010.  I cannot find this result.  In 2010 she had an occluded LAD at the ostium. She has had a LIMA to the LAD placed in 1996 and this was noted to be patent at the last cath.  She tells me the LIMA was still patent in 2010.  She presents with arm pain.    She reports that she has not felt good for a week.  She has had more SOB.  She went to the beach where she was very sedentary this week.  She did not even walk on the sand because she did not feel well.  She came home today and came to the ED.  She has had some sharp short lived chest pains.  However, she has had a constant left arm discomfort.  She does not think this is similar to previous angina.  However, she has had associated nausea and given the fact that she has an overall decreased exercise tolerance and otherwise felt poorly she came to the ED.   Of note her EKG shows T wave inversion new compared with her previous EKG.  However, her initial enzyme is negative.  She reports increased leg edema and a 14 lb weight gain in two days although she is not having labored breathing, PND or orthopnea.     Past Medical History  Diagnosis Date  . Hypertension   . Coronary artery disease   . Sleep apnea     uses a cpap  . Hyperlipemia   . Arthritis   . Trigger thumb of left hand 09/17/2012  . Carotid stenosis     Past Surgical History  Procedure Laterality Date  . Dilation and curettage of uterus  1985  . Tubal ligation    . Knee arthroscopy with patella reconstruction  1968    left  . Coronary artery bypass graft  1996    x1  . Nasal septoplasty w/ turbinoplasty  2008    to help tolerated cpap  . Cardiac catheterization      x3  .  Trigger finger release Left 09/17/2012    Procedure: RELEASE TRIGGER FINGER/A-1 PULLEY LEFT THUMB (TENDON SHEATH INCISION);  Surgeon: Johnny Bridge, MD;  Location: [facility] [place] [facility];  Service: Orthopedics;  Laterality: Left;    Allergies  Allergen Reactions  . Sulfamethoxazole-Trimethoprim Other (See Comments)    REACTION: "DON'T REMEMBER"   No current facility-administered medications on file prior to encounter.   Current Outpatient Prescriptions on File Prior to Encounter  Medication Sig Dispense Refill  . aspirin 81 MG tablet Take 81 mg by mouth at bedtime.     Marland Kitchen atenolol (TENORMIN) 100 MG tablet Take 1 tablet (100 mg total) by mouth every evening.    . calcium carbonate (OS-CAL - DOSED IN MG OF ELEMENTAL CALCIUM) 1250 MG tablet Take 1 tablet by mouth daily.    . cholecalciferol (VITAMIN D) 1000 UNITS tablet Take 1,000 Units by mouth daily.    . fish oil-omega-3 fatty acids 1000 MG capsule Take 1,200 mg by mouth daily.    Marland Kitchen ibuprofen (ADVIL,MOTRIN) 200 MG tablet Take 200 mg by mouth every 6 (six) hours as needed for  mild pain or moderate pain.    . Multiple Vitamins-Minerals (MULTIVITAMIN WITH MINERALS) tablet Take 1 tablet by mouth daily.    . naproxen sodium (ANAPROX) 220 MG tablet Take 220 mg by mouth 2 (two) times daily as needed (pain).    . polyvinyl alcohol (LIQUIFILM TEARS) 1.4 % ophthalmic solution Place 2 drops into the right eye as needed for dry eyes. 15 mL 0  . simvastatin (ZOCOR) 20 MG tablet Take 20 mg by mouth at bedtime.    Marland Kitchen loratadine (CLARITIN) 10 MG tablet Take 10 mg by mouth daily as needed for allergies.     Social History   Social History  . Marital Status: Divorced    Spouse Name: N/A  . Number of Children: N/A  . Years of Education: N/A   Occupational History  . Not on file.   Social History Main Topics  . Smoking status: Former Smoker -- 1.00 packs/day    Quit date: 07/30/2014  . Smokeless tobacco: Not on file  . Alcohol Use: No  . Drug  Use: No  . Sexual Activity: Not on file   Other Topics Concern  . Not on file   Social History Narrative    Family History  Problem Relation Age of Onset  . Stroke Mother   . Stroke Father   . Heart attack Father   . Heart disease Father   . Hyperlipidemia Father   . Hypertension Father      ROS:  Positive for constipation.  Otherwise as stated in the HPI and negative for all other systems.  Physical Exam: Blood pressure 141/72, pulse 53, temperature 97.9 F (36.6 C), temperature source Oral, resp. rate 15, height 5' 2.99" (1.6 m), weight 253 lb 1.4 oz (114.8 kg), SpO2 100 %.  GENERAL:  Well appearing HEENT:  Pupils equal round and reactive, fundi not visualized, oral mucosa unremarkable NECK:  No jugular venous distention, waveform within normal limits, carotid upstroke brisk and symmetric, no bruits, no thyromegaly LYMPHATICS:  No cervical, inguinal adenopathy LUNGS:  Clear to auscultation bilaterally BACK:  No CVA tenderness CHEST:  Well healed sternotomy scar. HEART:  PMI not displaced or sustained,S1 and S2 within normal limits, no S3, no S4, no clicks, no rubs, no murmurs ABD:  Flat, positive bowel sounds normal in frequency in pitch, no bruits, no rebound, no guarding, no midline pulsatile mass, no hepatomegaly, no splenomegaly EXT:  2 plus pulses throughout, trace edema, no cyanosis no clubbing SKIN:  No rashes no nodules NEURO:  Cranial nerves II through XII grossly intact, motor grossly intact throughout PSYCH:  Cognitively intact, oriented to person place and time  Labs: Lab Results  Component Value Date   BUN 7 08/22/2015   Lab Results  Component Value Date   CREATININE 0.79 08/22/2015   Lab Results  Component Value Date   NA 140 08/22/2015   K 4.3 08/22/2015   CL 106 08/22/2015   CO2 23 08/22/2015    Lab Results  Component Value Date   WBC 8.0 08/22/2015   HGB 14.6 08/22/2015   HCT 44.0 08/22/2015   MCV 89.8 08/22/2015   PLT 318 08/22/2015     Lab Results  Component Value Date   ALT 58* 10/31/2014   AST 50* 10/31/2014   ALKPHOS 92 10/31/2014   BILITOT 0.6 10/31/2014      Radiology:  CXR:  Median sternotomy wires are stable in alignment. Cardiomediastinal silhouette is stable in size and configuration. Lungs are clear. No pleural  effusion or pneumothorax seen. No acute- appearing osseous abnormality seen.  EKG:  NSR, rate 58, axis WNL, intervals WNL, interior lateral T wave inversion consistent with ischemia.  New since previous EKG.  08/22/2015  ASSESSMENT AND PLAN:    CHEST PAIN:    Possible unstable angina.  Plan cardiac cath in the AM.  The patient understands that risks included but are not limited to stroke (1 in 1000), death (1 in 1000), kidney failure [usually temporary] (1 in 500), bleeding (1 in 200), allergic reaction [possibly serious] (1 in 200).  The patient understands and agrees to proceed.   Continue ASA and start heparin, NTG paste.  Check echocardiogram.    DYSLIPIDEMIA:    Continue current therapy and check a fasting lipid profile.  SLEEP APNEA:  Continue home CPAP.  SignedMinus Breeding 08/22/2015, 10:10 PM

## 2015-08-22 NOTE — ED Notes (Signed)
Pt c/o intermittent left sided chest pain, nausea, left arm numbness/tingling for the past couple of days. Pt states she just got back from the beach and hasn't been feeling well for the past couple of days. Pt endorsees intermittent nausea, shortness of breath, and swelling in both legs. Pt states she has gained 14 lbs since Monday.

## 2015-08-22 NOTE — ED Notes (Signed)
IV team consult placed. RN attempt at IV start x 2 unsuccessful.

## 2015-08-23 ENCOUNTER — Encounter (HOSPITAL_COMMUNITY)
Admission: EM | Disposition: A | Discharge status: Home / Self Care | Payer: Self-pay | Source: Home / Self Care | Attending: Emergency Medicine

## 2015-08-23 ENCOUNTER — Observation Stay (HOSPITAL_BASED_OUTPATIENT_CLINIC_OR_DEPARTMENT_OTHER): Payer: Medicare Other

## 2015-08-23 ENCOUNTER — Encounter (HOSPITAL_COMMUNITY): Payer: Self-pay | Admitting: Cardiology

## 2015-08-23 DIAGNOSIS — R079 Chest pain, unspecified: Secondary | ICD-10-CM | POA: Diagnosis not present

## 2015-08-23 DIAGNOSIS — Z955 Presence of coronary angioplasty implant and graft: Secondary | ICD-10-CM | POA: Diagnosis not present

## 2015-08-23 DIAGNOSIS — I10 Essential (primary) hypertension: Secondary | ICD-10-CM | POA: Diagnosis not present

## 2015-08-23 DIAGNOSIS — G4730 Sleep apnea, unspecified: Secondary | ICD-10-CM

## 2015-08-23 DIAGNOSIS — E785 Hyperlipidemia, unspecified: Secondary | ICD-10-CM | POA: Diagnosis not present

## 2015-08-23 DIAGNOSIS — I5033 Acute on chronic diastolic (congestive) heart failure: Secondary | ICD-10-CM

## 2015-08-23 DIAGNOSIS — I2510 Atherosclerotic heart disease of native coronary artery without angina pectoris: Secondary | ICD-10-CM | POA: Diagnosis not present

## 2015-08-23 DIAGNOSIS — I25110 Atherosclerotic heart disease of native coronary artery with unstable angina pectoris: Secondary | ICD-10-CM | POA: Diagnosis not present

## 2015-08-23 HISTORY — PX: CARDIAC CATHETERIZATION: SHX172

## 2015-08-23 LAB — ECHOCARDIOGRAM COMPLETE
Height: 63 in
WEIGHTICAEL: 4009.6 [oz_av]

## 2015-08-23 LAB — PROTIME-INR
INR: 1.08 (ref 0.00–1.49)
Prothrombin Time: 14.2 seconds (ref 11.6–15.2)

## 2015-08-23 LAB — HEPARIN LEVEL (UNFRACTIONATED)
HEPARIN UNFRACTIONATED: 0.27 [IU]/mL — AB (ref 0.30–0.70)
Heparin Unfractionated: 0.19 IU/mL — ABNORMAL LOW (ref 0.30–0.70)

## 2015-08-23 LAB — CBC
HCT: 39.4 % (ref 36.0–46.0)
HEMOGLOBIN: 12.9 g/dL (ref 12.0–15.0)
MCH: 29.2 pg (ref 26.0–34.0)
MCHC: 32.7 g/dL (ref 30.0–36.0)
MCV: 89.1 fL (ref 78.0–100.0)
PLATELETS: 251 10*3/uL (ref 150–400)
RBC: 4.42 MIL/uL (ref 3.87–5.11)
RDW: 13.1 % (ref 11.5–15.5)
WBC: 7.6 10*3/uL (ref 4.0–10.5)

## 2015-08-23 LAB — TROPONIN I: Troponin I: <0.03 ng/mL (ref <0.031)

## 2015-08-23 LAB — BRAIN NATRIURETIC PEPTIDE: B Natriuretic Peptide: 93.4 pg/mL (ref 0.0–100.0)

## 2015-08-23 LAB — APTT: APTT: 32 s (ref 24–37)

## 2015-08-23 SURGERY — LEFT HEART CATH AND CORS/GRAFTS ANGIOGRAPHY
Anesthesia: LOCAL

## 2015-08-23 MED ORDER — ATENOLOL 25 MG PO TABS
100.0000 mg | ORAL_TABLET | Freq: Every day | ORAL | Status: DC
  Administered 2015-08-23: 100 mg via ORAL
  Filled 2015-08-23: qty 4

## 2015-08-23 MED ORDER — HEPARIN (PORCINE) IN NACL 2-0.9 UNIT/ML-% IJ SOLN
INTRAMUSCULAR | Status: AC
  Filled 2015-08-23: qty 1000

## 2015-08-23 MED ORDER — SODIUM CHLORIDE 0.9% FLUSH
3.0000 mL | Freq: Two times a day (BID) | INTRAVENOUS | Status: DC
  Administered 2015-08-23: 3 mL via INTRAVENOUS

## 2015-08-23 MED ORDER — SODIUM CHLORIDE 0.9 % IV SOLN: 250.0000 mL | INTRAVENOUS | Status: DC | PRN

## 2015-08-23 MED ORDER — ATENOLOL 25 MG PO TABS
100.0000 mg | ORAL_TABLET | Freq: Every day | ORAL | Status: DC
  Administered 2015-08-24: 100 mg via ORAL
  Filled 2015-08-23: qty 4

## 2015-08-23 MED ORDER — PERFLUTREN LIPID MICROSPHERE
INTRAVENOUS | Status: AC
  Filled 2015-08-23: qty 10

## 2015-08-23 MED ORDER — HEPARIN SODIUM (PORCINE) 1000 UNIT/ML IJ SOLN
INTRAMUSCULAR | Status: AC
  Filled 2015-08-23: qty 1

## 2015-08-23 MED ORDER — POLYVINYL ALCOHOL 1.4 % OP SOLN: 2.0000 [drp] | OPHTHALMIC | Status: DC | PRN

## 2015-08-23 MED ORDER — MIDAZOLAM HCL 2 MG/2ML IJ SOLN
INTRAMUSCULAR | Status: DC | PRN
  Administered 2015-08-23 (×2): 1 mg via INTRAVENOUS

## 2015-08-23 MED ORDER — ASPIRIN EC 81 MG PO TBEC
81.0000 mg | DELAYED_RELEASE_TABLET | Freq: Once | ORAL | Status: AC
Start: 2015-08-23 — End: 2015-08-23
  Administered 2015-08-23: 81 mg via ORAL
  Filled 2015-08-23: qty 1

## 2015-08-23 MED ORDER — ASPIRIN EC 81 MG PO TBEC: 81.0000 mg | DELAYED_RELEASE_TABLET | Freq: Every day | ORAL | Status: DC

## 2015-08-23 MED ORDER — SODIUM CHLORIDE 0.9 % WEIGHT BASED INFUSION
1.0000 mL/kg/h | INTRAVENOUS | Status: DC
  Administered 2015-08-23: 1 mL/kg/h via INTRAVENOUS

## 2015-08-23 MED ORDER — SODIUM CHLORIDE 0.9% FLUSH
3.0000 mL | Freq: Two times a day (BID) | INTRAVENOUS | Status: DC
  Administered 2015-08-23 – 2015-08-24 (×2): 3 mL via INTRAVENOUS

## 2015-08-23 MED ORDER — ATENOLOL 25 MG PO TABS: 100.0000 mg | ORAL_TABLET | Freq: Every day | ORAL | Status: DC

## 2015-08-23 MED ORDER — SODIUM CHLORIDE 0.9 % WEIGHT BASED INFUSION: 3.0000 mL/kg/h | INTRAVENOUS | Status: DC

## 2015-08-23 MED ORDER — SIMVASTATIN 20 MG PO TABS: 20.0000 mg | ORAL_TABLET | Freq: Every day | ORAL | Status: DC

## 2015-08-23 MED ORDER — VERAPAMIL HCL 2.5 MG/ML IV SOLN
INTRAVENOUS | Status: AC
  Filled 2015-08-23: qty 2

## 2015-08-23 MED ORDER — ONDANSETRON HCL 4 MG/2ML IJ SOLN: 4.0000 mg | Freq: Four times a day (QID) | INTRAMUSCULAR | Status: DC | PRN

## 2015-08-23 MED ORDER — MIDAZOLAM HCL 2 MG/2ML IJ SOLN
INTRAMUSCULAR | Status: AC
  Filled 2015-08-23: qty 2

## 2015-08-23 MED ORDER — SODIUM CHLORIDE 0.9% FLUSH: 3.0000 mL | INTRAVENOUS | Status: DC | PRN

## 2015-08-23 MED ORDER — VERAPAMIL HCL 2.5 MG/ML IV SOLN
INTRAVENOUS | Status: DC | PRN
  Administered 2015-08-23: 10 mL via INTRA_ARTERIAL

## 2015-08-23 MED ORDER — FUROSEMIDE 10 MG/ML IJ SOLN
40.0000 mg | Freq: Once | INTRAMUSCULAR | Status: AC
  Administered 2015-08-23: 40 mg via INTRAVENOUS
  Filled 2015-08-23: qty 4

## 2015-08-23 MED ORDER — ACETAMINOPHEN 325 MG PO TABS
650.0000 mg | ORAL_TABLET | ORAL | Status: DC | PRN
  Administered 2015-08-23: 650 mg via ORAL
  Filled 2015-08-23: qty 2

## 2015-08-23 MED ORDER — FENTANYL CITRATE (PF) 100 MCG/2ML IJ SOLN
INTRAMUSCULAR | Status: DC | PRN
  Administered 2015-08-23 (×2): 25 ug via INTRAVENOUS

## 2015-08-23 MED ORDER — ACETAMINOPHEN 325 MG PO TABS: 650.0000 mg | ORAL_TABLET | ORAL | Status: DC | PRN

## 2015-08-23 MED ORDER — HEPARIN (PORCINE) IN NACL 2-0.9 UNIT/ML-% IJ SOLN
INTRAMUSCULAR | Status: DC | PRN
  Administered 2015-08-23: 1000 mL via INTRA_ARTERIAL

## 2015-08-23 MED ORDER — CALCIUM CARBONATE 1250 (500 CA) MG PO TABS
1.0000 | ORAL_TABLET | Freq: Every day | ORAL | Status: DC
  Administered 2015-08-24: 500 mg via ORAL
  Filled 2015-08-23: qty 1

## 2015-08-23 MED ORDER — IOPAMIDOL (ISOVUE-370) INJECTION 76%
INTRAVENOUS | Status: DC | PRN
  Administered 2015-08-23: 75 mL via INTRA_ARTERIAL

## 2015-08-23 MED ORDER — SODIUM CHLORIDE 0.9 % WEIGHT BASED INFUSION
1.0000 mL/kg/h | INTRAVENOUS | Status: AC
  Administered 2015-08-23 (×2): 1 mL/kg/h via INTRAVENOUS

## 2015-08-23 MED ORDER — LIDOCAINE HCL (PF) 1 % IJ SOLN
INTRAMUSCULAR | Status: AC
  Filled 2015-08-23: qty 30

## 2015-08-23 MED ORDER — FENTANYL CITRATE (PF) 100 MCG/2ML IJ SOLN
INTRAMUSCULAR | Status: AC
  Filled 2015-08-23: qty 2

## 2015-08-23 MED ORDER — CALCIUM CARBONATE 1250 (500 CA) MG PO TABS
1.0000 | ORAL_TABLET | Freq: Every day | ORAL | Status: DC
  Administered 2015-08-23: 500 mg via ORAL
  Filled 2015-08-23: qty 1

## 2015-08-23 MED ORDER — LIDOCAINE HCL (PF) 1 % IJ SOLN
INTRAMUSCULAR | Status: DC | PRN
  Administered 2015-08-23: 3 mL

## 2015-08-23 MED ORDER — HEPARIN SODIUM (PORCINE) 1000 UNIT/ML IJ SOLN
INTRAMUSCULAR | Status: DC | PRN
  Administered 2015-08-23: 5500 [IU] via INTRAVENOUS

## 2015-08-23 SURGICAL SUPPLY — 11 items
CATH INFINITI 5 FR IM (CATHETERS) ×2 IMPLANT
CATH INFINITI 5FR MULTPACK ANG (CATHETERS) ×2 IMPLANT
DEVICE RAD COMP TR BAND LRG (VASCULAR PRODUCTS) ×2 IMPLANT
GLIDESHEATH SLEND SS 6F .021 (SHEATH) ×2 IMPLANT
KIT HEART LEFT (KITS) ×2 IMPLANT
PACK CARDIAC CATHETERIZATION (CUSTOM PROCEDURE TRAY) ×2 IMPLANT
SYR MEDRAD MARK V 150ML (SYRINGE) ×2 IMPLANT
TRANSDUCER W/STOPCOCK (MISCELLANEOUS) ×2 IMPLANT
TUBING CIL FLEX 10 FLL-RA (TUBING) ×2 IMPLANT
WIRE HI TORQ VERSACORE-J 145CM (WIRE) ×2 IMPLANT
WIRE SAFE-T 1.5MM-J .035X260CM (WIRE) ×2 IMPLANT

## 2015-08-23 NOTE — H&P (View-Only) (Signed)
       Patient Name: Kathryn Castillo Date of Encounter: 08/23/2015    SUBJECTIVE: She is well known to me. Her history of the past 2 weeks is that of a trip to the beach, 14 pound weight cane, and relatively recent recurring left arm and chest discomfort. EKG last evening on presentation revealed T-wave inversion inferolaterally. These changes are new compared to prior.  TELEMETRY:  Normal sinus rhythm: Filed Vitals:   08/22/15 2200 08/22/15 2333 08/23/15 0045 08/23/15 0400  BP: 148/64 146/67 107/46 104/41  Pulse: 49 56 52 52  Temp:   97.5 F (36.4 C) 98.1 F (36.7 C)  TempSrc:   Oral Oral  Resp: 17 17 18 18   Height: 5' 2.99" (1.6 m)  5\' 3"  (1.6 m)   Weight: 253 lb 1.4 oz (114.8 kg)  250 lb 9.6 oz (113.671 kg)   SpO2: 100% 98% 98% 100%    Intake/Output Summary (Last 24 hours) at 08/23/15 1134 Last data filed at 08/23/15 1107  Gross per 24 hour  Intake      0 ml  Output   1150 ml  Net  -1150 ml   LABS: Basic Metabolic Panel:  Recent Labs  08/22/15 2003  NA 140  K 4.3  CL 106  CO2 23  GLUCOSE 124*  BUN 7  CREATININE 0.79  CALCIUM 9.5   CBC:  Recent Labs  08/22/15 2003 08/23/15 0214  WBC 8.0 7.6  HGB 14.6 12.9  HCT 44.0 39.4  MCV 89.8 89.1  PLT 318 251   Cardiac Enzymes:  Recent Labs  08/23/15 0214 08/23/15 0456 08/23/15 0728  TROPONINI <0.03 <0.03 <0.03   BNP: Invalid input(s): POCBNP Hemoglobin A1C: No results for input(s): HGBA1C in the last 72 hours. Fasting Lipid Panel: No results for input(s): CHOL, HDL, LDLCALC, TRIG, CHOLHDL, LDLDIRECT in the last 72 hours.  Radiology/Studies:  No acute abnormalities noted on admission chest x-ray  Physical Exam: Blood pressure 104/41, pulse 52, temperature 98.1 F (36.7 C), temperature source Oral, resp. rate 18, height 5\' 3"  (1.6 m), weight 250 lb 9.6 oz (113.671 kg), SpO2 100 %. Weight change:   Wt Readings from Last 3 Encounters:  08/23/15 250 lb 9.6 oz (113.671 kg)  11/01/14 229 lb 8 oz  (104.101 kg)  09/12/14 237 lb (107.502 kg)   Morbidly obese Chest is clear Cardiac exam without significant murmur or gallop Peripheral edema is noted  ASSESSMENT:  1. Coronary artery disease with prior coronary bypass grafting for single vessel disease, LIMA to LAD. 2. Acute on chronic diastolic heart failure with recent significant weight gain probably related to dietary indiscretion 3. Morbid obesity 4. Sleep apnea without recent adjustment in sleep apparatus. She complains that she does not sleep well and feels fatigued all the time.  Plan:  1. Diagnostic coronary angiography 2. We need to have repeat sleep evaluation and update of her equipment as an outpatient 3. Diuresis post-cath  Demetrios Isaacs 08/23/2015, 11:34 AM

## 2015-08-23 NOTE — Progress Notes (Signed)
Echocardiogram 2D Echocardiogram has been performed.  Joelene Millin 08/23/2015, 11:01 AM

## 2015-08-23 NOTE — Interval H&P Note (Signed)
History and Physical Interval Note:  08/23/2015 11:38 AM  Kathryn Castillo  has presented today for surgery, with the diagnosis of unstable angina  The various methods of treatment have been discussed with the patient and family. After consideration of risks, benefits and other options for treatment, the patient has consented to  Procedure(s): Left Heart Cath and Cors/Grafts Angiography (N/A) as a surgical intervention .  The patient's history has been reviewed, patient examined, no change in status, stable for surgery.  I have reviewed the patient's chart and labs.  Questions were answered to the patient's satisfaction.   Cath Lab Visit (complete for each Cath Lab visit)  Clinical Evaluation Leading to the Procedure:   ACS: Yes.    Non-ACS:    Anginal Classification: CCS III  Anti-ischemic medical therapy: Minimal Therapy (1 class of medications)  Non-Invasive Test Results: No non-invasive testing performed  Prior CABG: Previous CABG        Kathryn Castillo [facility] 08/23/2015 11:39 AM

## 2015-08-23 NOTE — Progress Notes (Signed)
       Patient Name: Kathryn Castillo Date of Encounter: 08/23/2015    SUBJECTIVE: She is well known to me. Her history of the past 2 weeks is that of a trip to the beach, 14 pound weight cane, and relatively recent recurring left arm and chest discomfort. EKG last evening on presentation revealed T-wave inversion inferolaterally. These changes are new compared to prior.  TELEMETRY:  Normal sinus rhythm: Filed Vitals:   08/22/15 2200 08/22/15 2333 08/23/15 0045 08/23/15 0400  BP: 148/64 146/67 107/46 104/41  Pulse: 49 56 52 52  Temp:   97.5 F (36.4 C) 98.1 F (36.7 C)  TempSrc:   Oral Oral  Resp: 17 17 18 18   Height: 5' 2.99" (1.6 m)  5\' 3"  (1.6 m)   Weight: 253 lb 1.4 oz (114.8 kg)  250 lb 9.6 oz (113.671 kg)   SpO2: 100% 98% 98% 100%    Intake/Output Summary (Last 24 hours) at 08/23/15 1134 Last data filed at 08/23/15 1107  Gross per 24 hour  Intake      0 ml  Output   1150 ml  Net  -1150 ml   LABS: Basic Metabolic Panel:  Recent Labs  08/22/15 2003  NA 140  K 4.3  CL 106  CO2 23  GLUCOSE 124*  BUN 7  CREATININE 0.79  CALCIUM 9.5   CBC:  Recent Labs  08/22/15 2003 08/23/15 0214  WBC 8.0 7.6  HGB 14.6 12.9  HCT 44.0 39.4  MCV 89.8 89.1  PLT 318 251   Cardiac Enzymes:  Recent Labs  08/23/15 0214 08/23/15 0456 08/23/15 0728  TROPONINI <0.03 <0.03 <0.03   BNP: Invalid input(s): POCBNP Hemoglobin A1C: No results for input(s): HGBA1C in the last 72 hours. Fasting Lipid Panel: No results for input(s): CHOL, HDL, LDLCALC, TRIG, CHOLHDL, LDLDIRECT in the last 72 hours.  Radiology/Studies:  No acute abnormalities noted on admission chest x-ray  Physical Exam: Blood pressure 104/41, pulse 52, temperature 98.1 F (36.7 C), temperature source Oral, resp. rate 18, height 5\' 3"  (1.6 m), weight 250 lb 9.6 oz (113.671 kg), SpO2 100 %. Weight change:   Wt Readings from Last 3 Encounters:  08/23/15 250 lb 9.6 oz (113.671 kg)  11/01/14 229 lb 8 oz  (104.101 kg)  09/12/14 237 lb (107.502 kg)   Morbidly obese Chest is clear Cardiac exam without significant murmur or gallop Peripheral edema is noted  ASSESSMENT:  1. Coronary artery disease with prior coronary bypass grafting for single vessel disease, LIMA to LAD. 2. Acute on chronic diastolic heart failure with recent significant weight gain probably related to dietary indiscretion 3. Morbid obesity 4. Sleep apnea without recent adjustment in sleep apparatus. She complains that she does not sleep well and feels fatigued all the time.  Plan:  1. Diagnostic coronary angiography 2. We need to have repeat sleep evaluation and update of her equipment as an outpatient 3. Diuresis post-cath  Demetrios Isaacs 08/23/2015, 11:34 AM

## 2015-08-23 NOTE — Progress Notes (Signed)
ANTICOAGULATION CONSULT NOTE - Follow-up Consult  Pharmacy Consult for heparin Indication: chest pain/ACS  Allergies  Allergen Reactions  . Sulfamethoxazole-Trimethoprim Other (See Comments)    REACTION: "DON'T REMEMBER"    Patient Measurements: Height: 5\' 3"  (160 cm) Weight: 250 lb 9.6 oz (113.671 kg) IBW/kg (Calculated) : 52.4 Heparin Dosing Weight: 80.3kg  Vital Signs: Temp: 97.5 F (36.4 C) (05/04 0045) Temp Source: Oral (05/04 0045) BP: 107/46 mmHg (05/04 0045) Pulse Rate: 52 (05/04 0045)  Labs:  Recent Labs  08/22/15 2003 08/22/15 2314 08/23/15 0214  HGB 14.6  --  12.9  HCT 44.0  --  39.4  PLT 318  --  251  APTT  --  32  --   HEPARINUNFRC  --   --  0.27*  CREATININE 0.79  --   --     Estimated Creatinine Clearance: 85.1 mL/min (by C-G formula based on Cr of 0.79).  Assessment: 65 yo f on heparin for r/o ACS. Heparin level slightly subtherapeutic on 950 units/hr. Level drawn ~2 hrs early so may even be lower as bolus could still be affecting. No issues with line or bleeding reported per RN. Hgb, plt down a bit. Cath planned for today.  Goal of Therapy:  Heparin level 0.3-0.7 units/ml Monitor platelets by anticoagulation protocol: Yes   Plan:  Increase heparin infusion to 1100 units/hr Will f/u 6 hr heparin level  Sherlon Handing, PharmD, BCPS Clinical pharmacist, pager [phone] 08/23/2015 3:37 AM

## 2015-08-24 DIAGNOSIS — F17200 Nicotine dependence, unspecified, uncomplicated: Secondary | ICD-10-CM

## 2015-08-24 DIAGNOSIS — E785 Hyperlipidemia, unspecified: Secondary | ICD-10-CM

## 2015-08-24 DIAGNOSIS — I10 Essential (primary) hypertension: Secondary | ICD-10-CM | POA: Diagnosis not present

## 2015-08-24 DIAGNOSIS — I25110 Atherosclerotic heart disease of native coronary artery with unstable angina pectoris: Secondary | ICD-10-CM | POA: Diagnosis not present

## 2015-08-24 DIAGNOSIS — R0789 Other chest pain: Secondary | ICD-10-CM

## 2015-08-24 DIAGNOSIS — I5033 Acute on chronic diastolic (congestive) heart failure: Secondary | ICD-10-CM | POA: Diagnosis not present

## 2015-08-24 LAB — CBC
HCT: 44.2 % (ref 36.0–46.0)
Hemoglobin: 15 g/dL (ref 12.0–15.0)
MCH: 29.9 pg (ref 26.0–34.0)
MCHC: 33.9 g/dL (ref 30.0–36.0)
MCV: 88.2 fL (ref 78.0–100.0)
Platelets: 287 10*3/uL (ref 150–400)
RBC: 5.01 MIL/uL (ref 3.87–5.11)
RDW: 13.2 % (ref 11.5–15.5)
WBC: 9.1 10*3/uL (ref 4.0–10.5)

## 2015-08-24 LAB — BASIC METABOLIC PANEL
ANION GAP: 16 — AB (ref 5–15)
BUN: 13 mg/dL (ref 6–20)
CALCIUM: 9.3 mg/dL (ref 8.9–10.3)
CO2: 22 mmol/L (ref 22–32)
Chloride: 103 mmol/L (ref 101–111)
Creatinine, Ser: 0.8 mg/dL (ref 0.44–1.00)
GFR calc Af Amer: >60 mL/min (ref >60)
GLUCOSE: 117 mg/dL — AB (ref 65–99)
Potassium: 4.2 mmol/L (ref 3.5–5.1)
SODIUM: 141 mmol/L (ref 135–145)

## 2015-08-24 MED ORDER — FUROSEMIDE 10 MG/ML IJ SOLN
20.0000 mg | Freq: Once | INTRAMUSCULAR | Status: AC
  Administered 2015-08-24: 20 mg via INTRAVENOUS
  Filled 2015-08-24: qty 2

## 2015-08-24 MED ORDER — POTASSIUM CHLORIDE CRYS ER 20 MEQ PO TBCR
20.0000 meq | EXTENDED_RELEASE_TABLET | Freq: Once | ORAL | Status: AC
  Administered 2015-08-24: 20 meq via ORAL
  Filled 2015-08-24: qty 1

## 2015-08-24 MED ORDER — ATENOLOL 100 MG PO TABS: 100.0000 mg | ORAL_TABLET | Freq: Every evening | ORAL | Status: AC

## 2015-08-24 NOTE — Progress Notes (Signed)
Patient Profile: 65 y/o female, followed by Dr. Tamala Julian, with a h/o CAD s/p remote CABG with LIMA-LAD and chronic diastolic dysfunction, admitted for CP concerning for unstable angina and acute on chronic diastolic CHF.    Subjective: Patient still notes LEE. Currently CP free. No resting dyspnea. She has not ambulated much today, thus can not fully make note of any residual exertional dyspnea.   Objective: Vital signs in last 24 hours: Temp:  [97.6 F (36.4 C)-98.2 F (36.8 C)] 98.2 F (36.8 C) (05/05 0354) Pulse Rate:  [55-67] 67 (05/05 0354) Resp:  [13-22] 16 (05/05 0354) BP: (117-141)/(29-71) 117/29 mmHg (05/05 0354) SpO2:  [96 %-100 %] 100 % (05/05 0354) Weight:  [247 lb 14.4 oz (112.447 kg)] 247 lb 14.4 oz (112.447 kg) (05/05 0700) Last BM Date: 08/22/15  Intake/Output from previous day: 05/04 0701 - 05/05 0700 In: 720 [P.O.:720] Out: 3450 [Urine:3450] Intake/Output this shift: Total I/O In: 240 [P.O.:240] Out: 800 [Urine:800]  Medications Current Facility-Administered Medications  Medication Dose Route Frequency Provider Last Rate Last Dose  . 0.9 %  sodium chloride infusion  250 mL Intravenous PRN Peter M Martinique, MD      . acetaminophen (TYLENOL) tablet 650 mg  650 mg Oral Q4H PRN Minus Breeding, MD      . aspirin EC tablet 81 mg  81 mg Oral QHS Minus Breeding, MD      . atenolol (TENORMIN) tablet 100 mg  100 mg Oral Daily Minus Breeding, MD   100 mg at 08/24/15 1025  . calcium carbonate (OS-CAL - dosed in mg of elemental calcium) tablet 500 mg of elemental calcium  1 tablet Oral Q breakfast Minus Breeding, MD   500 mg of elemental calcium at 08/24/15 1023  . polyvinyl alcohol (LIQUIFILM TEARS) 1.4 % ophthalmic solution 2 drop  2 drop Right Eye PRN Minus Breeding, MD      . sodium chloride flush (NS) 0.9 % injection 3 mL  3 mL Intravenous Q12H Peter M Martinique, MD   3 mL at 08/24/15 1000  . sodium chloride flush (NS) 0.9 % injection 3 mL  3 mL Intravenous PRN Peter M  Martinique, MD        PE: General appearance: alert, cooperative, no distress and moderately obese Neck: no carotid bruit and no JVD Lungs: clear to auscultation bilaterally Heart: regular rate and rhythm, S1, S2 normal, no murmur, click, rub or gallop Extremities: trace bilateral LEE Pulses: 2+ and symmetric Skin: warm and dry Neurologic: Grossly normal  Lab Results:   Recent Labs  08/22/15 2003 08/23/15 0214 08/24/15 0246  WBC 8.0 7.6 9.1  HGB 14.6 12.9 15.0  HCT 44.0 39.4 44.2  PLT 318 251 287   BMET  Recent Labs  08/22/15 2003 08/24/15 0246  NA 140 141  K 4.3 4.2  CL 106 103  CO2 23 22  GLUCOSE 124* 117*  BUN 7 13  CREATININE 0.79 0.80  CALCIUM 9.5 9.3   PT/INR  Recent Labs  08/23/15 0728  LABPROT 14.2  INR 1.08   Cardiac Panel (last 3 results)  Recent Labs  08/23/15 0214 08/23/15 0456 08/23/15 0728  TROPONINI <0.03 <0.03 <0.03    Studies/Results:  Procedures    Left Heart Cath and Cors/Grafts Angiography    Conclusion     Ost LAD to Prox LAD lesion, 100% stenosed.  LIMA was injected is normal in caliber, and is anatomically normal.  The left ventricular systolic function is normal.  1. Single  vessel obstructive CAD with ostial LAD occlusion 2. Patent LIMA to the LAD 3. Normal LV function.      Assessment/Plan    Active Problems:   Hyperlipidemia   TOBACCO ABUSE   Essential hypertension   Unstable angina (HCC)   Acute on chronic diastolic heart failure (HCC)   Pain in the chest  1. CP/ CAD: pt ruled out for MI with negative cardiac enzymes x 3. LHC unchanged from prior in 2010. She has known ostial LAD occlusion, however her LIMA to LAD remains widely patent. No other residual diease noted. LVEF is normal. Currently w/o CP. Continue medical therapy. Currently on ASA and BB. Left radial cath site is stable.   2. Acute on Chronic Diastolic CHF: EF normal by cath and echo at 60-65%. Grade 2DD noted on echo. Patient has  diuresed 3.3 L thus far with IV lasix, but still with mild LEE bilaterally. No resting dyspnea however she has not ambulated much today, thus can not fully make note of any residual exertional dyspnea. She only received 1 dose of IV lasix yesterday. No Lasix given today. Renal function, K and BP all stable. Will given another 1 x dose of IV lasix. Possible d/c home later today.   3. Vaginal Itch: patient belives she has a yeast infection and is requesting Rx. Patient given PRN antifungal topical powder for symptomatic relief. Patient advised to f/u with PCP for further management.    Brittainy M. Ladoris Gene 08/24/2015 11:50 AM  The patient has been seen in conjunction with Ellen Henri, PA-C. All aspects of care have been considered and discussed. The patient has been personally interviewed, examined, and all clinical data has been reviewed.   The presentation with chest and left arm discomfort was not due to coronary ischemia. Cardiac catheterization reveals a patent LIMA to the LAD and no significant coronary disease otherwise.  Fluid retention is due to acute on chronic diastolic heart failure related to dietary indiscretion and probably inadequate management of obstructive sleep apnea. The patient has had a significant diuresis and is currently stable for discharge.  As an outpatient, she will need reassessment of sleep apnea and probably need refitting of her equipment.  If there is reaccumulation of peripheral edema, low-dose diuretic therapy will be added.  Cardiology follow-up in 7-10 days.

## 2015-08-24 NOTE — Discharge Summary (Signed)
Discharge Summary    Patient ID: Kathryn Castillo,  MRN: [id number], DOB/AGE: 1950/04/25 65 y.o.  Admit date: 08/22/2015 Discharge date: 08/24/2015  Primary Care Provider: Seward Carol D Primary Cardiologist: Dr. Tamala Julian  Discharge Diagnoses    Active Problems:   Hyperlipidemia   TOBACCO ABUSE   Essential hypertension   Acute on chronic diastolic heart failure (HCC)   Pain in the chest   Allergies Allergies  Allergen Reactions  . Sulfamethoxazole-Trimethoprim Other (See Comments)    REACTION: "DON'T REMEMBER"    Diagnostic Studies/Procedures    Procedures    Left Heart Cath and Cors/Grafts Angiography    Conclusion     Ost LAD to Prox LAD lesion, 100% stenosed.  LIMA was injected is normal in caliber, and is anatomically normal.  The left ventricular systolic function is normal.  1. Single vessel obstructive CAD with ostial LAD occlusion 2. Patent LIMA to the LAD 3. Normal LV function.   Left Heart    Left Ventricle The left ventricular size is normal. The left ventricular systolic function is normal. The left ventricular ejection fraction is 55-65% by visual estimate. There are no wall motion abnormalities in the left ventricle.   2D Echo 08/23/15 LV EF: 60% - 65%  ------------------------------------------------------------------- Indications: Chest pain 786.51.  ------------------------------------------------------------------- History: Risk factors: Former tobacco use. Hypertension. Dyslipidemia.  ------------------------------------------------------------------- Study Conclusions  - Left ventricle: The cavity size was normal. Wall thickness was  increased in a pattern of mild LVH. Systolic function was normal.  The estimated ejection fraction was in the range of 60% to 65%.  Features are consistent with a pseudonormal left ventricular  filling pattern, with concomitant abnormal relaxation and  increased filling pressure  (grade 2 diastolic dysfunction).    History of Present Illness     65 y/o female, followed by Dr. Tamala Julian, with a h/o CAD s/p remote CABG. Her last LHC in 2010 showed an occluded LAD to the ostium. She has had a LIMA to the LAD placed in 1996. This was patent at time of cath in 2010.   She presented to the [facility] ED night of 08/22/15 with complaint of chest and left arm pain with increasing dyspnea. POC troponin was negative. EKG in ED showed NSR, rate 58, axis WNL, intervals WNL, interior lateral T wave inversion consistent with ischemia. New since previous EKG.Her symptoms and presentation was concerning for unstable angina. She was also felt to volume overloaded. This was in the setting of recent dietary indiscretion with sodium. She was admitted for further management for CP and acute on chronic diastolic HF.   Hospital Course     Patient was admitted to telemetry. Cardiac enzymes were cycled x 3 and were negative, ruling her out for MI. However, given her symptomology, definitive LHC was recommended. The procedure was performed by Dr. Martinique on 08/23/15. She was found to have single vessel obstructive CAD with ostial LAD occlusion, however her LIMA to the LAD was widely patent. EF was normal. Cath was unchanged from prior in 2010. Continue medical therapy was reccommended. Atenolol was increased to 100 mg. She left the cath lab in stable condition and was monitored overnight for observation. She had no post cath complications.    Her acute CHF was treated with IV diuretics. She had excellent diuresis. I/Os net negative 3.3L. Her breathing improved as well as her volume. Dr. Tamala Julian felt that fluid retention was due to acute on chronic diastolic HF related to dietary indiscretion and probably inadequate  management of OSA. He recommended outpatient reassessment of sleep apnea and probable refitting of her equipment. She will not be discharged on a standing dose diuretic. She will be reassessed in clinic. If  there is reaccumulation of peripheral edema, low-dose diuretic therapy will be added. 7-10 day TOC f/u has been arranged on 08/31/15.   Consultants: none  Discharge Vitals Blood pressure 117/29, pulse 67, temperature 98.2 F (36.8 C), temperature source Oral, resp. rate 16, height 5\' 3"  (1.6 m), weight 247 lb 14.4 oz (112.447 kg), SpO2 100 %.  Filed Weights   08/22/15 2200 08/23/15 0045 08/24/15 0700  Weight: 253 lb 1.4 oz (114.8 kg) 250 lb 9.6 oz (113.671 kg) 247 lb 14.4 oz (112.447 kg)    Labs & Radiologic Studies    CBC  Recent Labs  08/23/15 0214 08/24/15 0246  WBC 7.6 9.1  HGB 12.9 15.0  HCT 39.4 44.2  MCV 89.1 88.2  PLT 251 [id number]   Basic Metabolic Panel  Recent Labs  08/22/15 2003 08/24/15 0246  NA 140 141  K 4.3 4.2  CL 106 103  CO2 23 22  GLUCOSE 124* 117*  BUN 7 13  CREATININE 0.79 0.80  CALCIUM 9.5 9.3   Liver Function Tests No results for input(s): AST, ALT, ALKPHOS, BILITOT, PROT, ALBUMIN in the last 72 hours. No results for input(s): LIPASE, AMYLASE in the last 72 hours. Cardiac Enzymes  Recent Labs  08/23/15 0214 08/23/15 0456 08/23/15 0728  TROPONINI <0.03 <0.03 <0.03   BNP Invalid input(s): POCBNP D-Dimer No results for input(s): DDIMER in the last 72 hours. Hemoglobin A1C No results for input(s): HGBA1C in the last 72 hours. Fasting Lipid Panel No results for input(s): CHOL, HDL, LDLCALC, TRIG, CHOLHDL, LDLDIRECT in the last 72 hours. Thyroid Function Tests No results for input(s): TSH, T4TOTAL, T3FREE, THYROIDAB in the last 72 hours.  Invalid input(s): FREET3 _____________  Dg Chest 2 View  08/22/2015  CLINICAL DATA:  Left-sided chest pain, nausea, weakness starting today. Bilateral foot swelling, intermittent, for couple of weeks. EXAM: CHEST  2 VIEW COMPARISON:  Chest x-ray dated 10/04/2008. FINDINGS: Median sternotomy wires are stable in alignment. Cardiomediastinal silhouette is stable in size and configuration. Lungs are clear. No  pleural effusion or pneumothorax seen. No acute- appearing osseous abnormality seen. IMPRESSION: Lungs are clear and there is no evidence of acute cardiopulmonary abnormality. Electronically Signed   By: Franki Cabot M.D.   On: 08/22/2015 21:22   Disposition   Pt is being discharged home today in good condition.  Follow-up Plans & Appointments    Follow-up Information    Follow up with Eileen Stanford, PA-C On 08/31/2015.   Specialties:  Cardiology, Radiology   Why:  3:30 PM Cardiology follow-up with Dr. Thompson Caul PA   Contact information:   [place]  [phone]        Discharge Medications   Current Discharge Medication List    CONTINUE these medications which have CHANGED   Details  atenolol (TENORMIN) 100 MG tablet Take 1 tablet (100 mg total) by mouth every evening.      CONTINUE these medications which have NOT CHANGED   Details  aspirin 81 MG tablet Take 81 mg by mouth at bedtime.     calcium carbonate (OS-CAL - DOSED IN MG OF ELEMENTAL CALCIUM) 1250 MG tablet Take 1 tablet by mouth daily.    cholecalciferol (VITAMIN D) 1000 UNITS tablet Take 1,000 Units by mouth daily.  fish oil-omega-3 fatty acids 1000 MG capsule Take 1,200 mg by mouth daily.    ibuprofen (ADVIL,MOTRIN) 200 MG tablet Take 200 mg by mouth every 6 (six) hours as needed for mild pain or moderate pain.    Multiple Vitamins-Minerals (MULTIVITAMIN WITH MINERALS) tablet Take 1 tablet by mouth daily.    naproxen sodium (ANAPROX) 220 MG tablet Take 220 mg by mouth 2 (two) times daily as needed (pain).    polyvinyl alcohol (LIQUIFILM TEARS) 1.4 % ophthalmic solution Place 2 drops into the right eye as needed for dry eyes. Qty: 15 mL, Refills: 0    simvastatin (ZOCOR) 20 MG tablet Take 20 mg by mouth at bedtime.    loratadine (CLARITIN) 10 MG tablet Take 10 mg by mouth daily as needed for allergies.         Outstanding Labs/Studies   Sleep Apnea   re-evaluation  Duration of Discharge Encounter   Greater than 30 minutes including physician time.  Signed, Lyda Jester PA-C 08/24/2015, 1:57 PM    The patient has been seen in conjunction with Ellen Henri, PA-C. All aspects of care have been considered and discussed. The patient has been personally interviewed, examined, and all clinical data has been reviewed.   Please see the note filed earlier today for details.

## 2015-08-24 NOTE — Care Management Obs Status (Signed)
[facility] NOTIFICATION   Patient Details  Name: Kathryn Castillo MRN: [id number] Date of Birth: June 03, 1950   Medicare Observation Status Notification Given:  Yes    Dawayne Patricia, RN 08/24/2015, 9:56 AM

## 2015-08-28 ENCOUNTER — Other Ambulatory Visit: Payer: Self-pay | Admitting: Internal Medicine

## 2015-08-28 DIAGNOSIS — N63 Unspecified lump in unspecified breast: Secondary | ICD-10-CM

## 2015-08-31 ENCOUNTER — Encounter: Payer: Self-pay | Admitting: Physician Assistant

## 2015-08-31 ENCOUNTER — Ambulatory Visit (INDEPENDENT_AMBULATORY_CARE_PROVIDER_SITE_OTHER): Payer: Medicare Other | Admitting: Physician Assistant

## 2015-08-31 VITALS — BP 132/82 | HR 61 | Ht 63.0 in | Wt 252.2 lb

## 2015-08-31 DIAGNOSIS — G4733 Obstructive sleep apnea (adult) (pediatric): Secondary | ICD-10-CM | POA: Diagnosis not present

## 2015-08-31 DIAGNOSIS — I25810 Atherosclerosis of coronary artery bypass graft(s) without angina pectoris: Secondary | ICD-10-CM

## 2015-08-31 DIAGNOSIS — I5032 Chronic diastolic (congestive) heart failure: Secondary | ICD-10-CM | POA: Diagnosis not present

## 2015-08-31 DIAGNOSIS — I10 Essential (primary) hypertension: Secondary | ICD-10-CM | POA: Diagnosis not present

## 2015-08-31 DIAGNOSIS — R5383 Other fatigue: Secondary | ICD-10-CM

## 2015-08-31 DIAGNOSIS — E785 Hyperlipidemia, unspecified: Secondary | ICD-10-CM

## 2015-08-31 LAB — TSH: TSH: 4.52 m[IU]/L — AB

## 2015-08-31 MED ORDER — POTASSIUM CHLORIDE CRYS ER 20 MEQ PO TBCR: 20.0000 meq | EXTENDED_RELEASE_TABLET | Freq: Every day | ORAL | Status: DC

## 2015-08-31 MED ORDER — FUROSEMIDE 40 MG PO TABS: 40.0000 mg | ORAL_TABLET | Freq: Every day | ORAL | Status: DC

## 2015-08-31 NOTE — Patient Instructions (Addendum)
Medication Instructions:  Your physician has recommended you make the following change in your medication:  1.  START Lasix 40 mg taking 1 tablet daily 2.  START Potassium 20 meq taking 1 tablet daily  Labwork: TODAY:  TSH  Testing/Procedures: None ordered  Follow-Up: Your physician recommends that you schedule a follow-up appointment in:     WITH DR. Radford Pax 1ST AVAILABLE FOR CPAP TITRATION & DR. Tamala Julian IN 3 MONTHS   Any Other Special Instructions Will Be Listed Below (If Applicable).   If you need a refill on your cardiac medications before your next appointment, please call your pharmacy.

## 2015-08-31 NOTE — Progress Notes (Signed)
Cardiology Office Note    Date:  08/31/2015   ID:  Kathryn Castillo, DOB 28-Oct-1950, MRN [id number]  PCP:  Kandice Hams, MD  Cardiologist:  Dr. Tamala Julian  Post hospital follow-up   History of Present Illness:  Kathryn Castillo is a 65 y.o. female with a history of CAD s/p remote CABG x1 with LIMA to LAD placed in 1996, HTN, OSA and HLD who presents to clinic for post hospital follow-up.  Her last LHC in 2010 showed an occluded LAD to the ostium. She has had a LIMA to the LAD placed in 1996. This was patent at time of cath in 2010.   She was admitted 5/3-08/24/15 for chest pain and acute on chronic diastolic CHF. Her symptoms were concerning for unstable angina. She underwent left heart cath in 08/23/15 which showed single vessel obstructive CAD with ostial LAD occlusion; however her LIMA to LAD was widely patent. Cath was felt to be unchanged from previous in 2010. Continued medical therapy was recommended. Atenolol was increased to 100 mg daily. He was also felt to be volume overloaded and treated with IV diuresis. Discharge weight 247. She did admit to dietary indiscretion and probably an adequate management of her OSA. She was not discharged on a standing diuretic. It was felt that if there was reaccumulation of peripheral edema overloaded diuretic therapy would be added at follow-up.   Today she presents to clinic for follow-up. She is feeling like she has regained fluid in her legs. She also complaints of fatigue and diaphoresis. No more chest pain. She has SOB with exertion. No orthopnea or pND. SHe has some dizziness due to vertigo but no syncope. No blood in her stool  Or urine. She does not work out but likes to work out in the garden. She feels like her work schedule is too busy to allow this.     Past Medical History  Diagnosis Date  . Hypertension   . Coronary artery disease   . Sleep apnea     uses a cpap  . Hyperlipemia   . Arthritis   . Trigger thumb of left hand 09/17/2012  .  Carotid stenosis   . S/P bunionectomy     Past Surgical History  Procedure Laterality Date  . Dilation and curettage of uterus  1985  . Tubal ligation    . Knee arthroscopy with patella reconstruction  1968    left  . Coronary artery bypass graft  1996    x1  . Nasal septoplasty w/ turbinoplasty  2008    to help tolerated cpap  . Cardiac catheterization      x3  . Trigger finger release Left 09/17/2012    Procedure: RELEASE TRIGGER FINGER/A-1 PULLEY LEFT THUMB (TENDON SHEATH INCISION);  Surgeon: Johnny Bridge, MD;  Location: [facility];  Service: Orthopedics;  Laterality: Left;  . Cardiac catheterization N/A 08/23/2015    Procedure: Left Heart Cath and Cors/Grafts Angiography;  Surgeon: Peter M Martinique, MD;  Location: [facility];  Service: Cardiovascular;  Laterality: N/A;    Current Medications: Outpatient Prescriptions Prior to Visit  Medication Sig Dispense Refill  . aspirin 81 MG tablet Take 81 mg by mouth at bedtime.     Marland Kitchen atenolol (TENORMIN) 100 MG tablet Take 1 tablet (100 mg total) by mouth every evening.    . calcium carbonate (OS-CAL - DOSED IN MG OF ELEMENTAL CALCIUM) 1250 MG tablet Take 1 tablet by mouth daily.    Marland Kitchen  cholecalciferol (VITAMIN D) 1000 UNITS tablet Take 1,000 Units by mouth daily.    . fish oil-omega-3 fatty acids 1000 MG capsule Take 1,200 mg by mouth daily.    Marland Kitchen ibuprofen (ADVIL,MOTRIN) 200 MG tablet Take 200 mg by mouth every 6 (six) hours as needed for mild pain or moderate pain.    Marland Kitchen loratadine (CLARITIN) 10 MG tablet Take 10 mg by mouth daily as needed for allergies.    . Multiple Vitamins-Minerals (MULTIVITAMIN WITH MINERALS) tablet Take 1 tablet by mouth daily.    . naproxen sodium (ANAPROX) 220 MG tablet Take 220 mg by mouth 2 (two) times daily as needed (pain).    . polyvinyl alcohol (LIQUIFILM TEARS) 1.4 % ophthalmic solution Place 2 drops into the right eye as needed for dry eyes. 15 mL 0  . simvastatin (ZOCOR) 20 MG tablet  Take 20 mg by mouth at bedtime.     No facility-administered medications prior to visit.     Allergies:   Sulfamethoxazole-trimethoprim   Social History   Social History  . Marital Status: Divorced    Spouse Name: N/A  . Number of Children: 1  . Years of Education: N/A   Social History Main Topics  . Smoking status: Former Smoker -- 1.00 packs/day for 35 years    Types: Cigarettes    Quit date: 07/30/2014  . Smokeless tobacco: None  . Alcohol Use: No  . Drug Use: No  . Sexual Activity: Not Asked   Other Topics Concern  . None   Social History Narrative   Lives alone        Family History:  The patient's family history includes Heart attack (age of onset: 38) in her father; Hyperlipidemia in her father; Hypertension in her father; Stroke in her father and mother.   ROS:   Please see the history of present illness.    ROS All other systems reviewed and are negative.   PHYSICAL EXAM:   VS:  BP 132/82 mmHg  Pulse 61  Ht 5\' 3"  (1.6 m)  Wt 252 lb 3.2 oz (114.397 kg)  BMI 44.69 kg/m2   GEN: Well nourished, well developed, in no acute distressobese.  HEENT: normal Neck: no JVD, carotid bruits, or masses Cardiac: RRR; no murmurs, rubs, or gallops, 1+ LE bilateral edema  Respiratory:  clear to auscultation bilaterally, normal work of breathing GI: soft, nontender, nondistended, + BS MS: no deformity or atrophy Skin: warm and dry, no rash Neuro:  Alert and Oriented x 3, Strength and sensation are intact Psych: euthymic mood, full affect  Wt Readings from Last 3 Encounters:  08/31/15 252 lb 3.2 oz (114.397 kg)  08/24/15 247 lb 14.4 oz (112.447 kg)  11/01/14 229 lb 8 oz (104.101 kg)      Studies/Labs Reviewed:   EKG:  EKG is ordered today.  The ekg ordered today demonstrates NSR, IRBB, T-wave abnormality similar to previous  Recent Labs: 10/31/2014: ALT 58* 08/23/2015: B Natriuretic Peptide 93.4 08/24/2015: BUN 13; Creatinine, Ser 0.80; Hemoglobin 15.0; Platelets  287; Potassium 4.2; Sodium 141   Lipid Panel   Additional studies/ records that were reviewed today include:   Procedures 08/23/15   Left Heart Cath and Cors/Grafts Angiography    Conclusion     Ost LAD to Prox LAD lesion, 100% stenosed.  LIMA was injected is normal in caliber, and is anatomically normal.  The left ventricular systolic function is normal.  1. Single vessel obstructive CAD with ostial LAD occlusion 2. Patent LIMA  to the LAD 3. Normal LV function.   Left Heart    Left Ventricle The left ventricular size is normal. The left ventricular systolic function is normal. The left ventricular ejection fraction is 55-65% by visual estimate. There are no wall motion abnormalities in the left ventricle.    2D Echo 08/23/15 LV EF: 60% - 65% Study Conclusions - Left ventricle: The cavity size was normal. Wall thickness was  increased in a pattern of mild LVH. Systolic function was normal.  The estimated ejection fraction was in the range of 60% to 65%.  Features are consistent with a pseudonormal left ventricular  filling pattern, with concomitant abnormal relaxation and  increased filling pressure (grade 2 diastolic dysfunction).         ASSESSMENT:    1. Coronary artery disease involving coronary bypass graft of native heart without angina pectoris   2. Chronic diastolic CHF (congestive heart failure) ([facility])   3. OSA (obstructive sleep apnea)   4. Essential hypertension   5. HLD (hyperlipidemia)   6. Other fatigue      PLAN:  In order of problems listed above:  CAD: Recent cath with stable CAD. Continued medical therapy recommended. Continue aspirin beta blocker and statin.  Acute on chronic diastolic CHF: she appears volume overloaded today. Will start Lasix 40 mg daily and K-Dur 20 mEq daily.  OSA: recommended outpatient reassessment of sleep apnea and probable refitting of her equipment. Dr. that used to manage this is now retired.  Will have her set up with Dr. Radford Pax. I spoke with Sammuel Bailiff RN who will get her information for Dr. Radford Pax prior to this appointment.  HTN: BP well controlled on current regimen.  HLD: Continue statin  Medication Adjustments/Labs and Tests Ordered: Current medicines are reviewed at length with the patient today.  Concerns regarding medicines are outlined above.  Medication changes, Labs and Tests ordered today are listed in the Patient Instructions below. Patient Instructions  Medication Instructions:  Your physician has recommended you make the following change in your medication:  1.  START Lasix 40 mg taking 1 tablet daily 2.  START Potassium 20 meq taking 1 tablet daily  Labwork: TODAY:  TSH  Testing/Procedures: None ordered  Follow-Up: Your physician recommends that you schedule a follow-up appointment in:     WITH DR. Radford Pax 1ST AVAILABLE FOR CPAP TITRATION & DR. Tamala Julian IN 3 MONTHS   Any Other Special Instructions Will Be Listed Below (If Applicable).   If you need a refill on your cardiac medications before your next appointment, please call your pharmacy.       Signed, Angelena Form, PA-C  08/31/2015 4:15 PM    [facility] [place], [place], [place]  [address] Phone: [phone]; Fax: [phone]

## 2015-09-05 ENCOUNTER — Ambulatory Visit
Admission: RE | Admit: 2015-09-05 | Discharge: 2015-09-05 | Disposition: A | Discharge status: Home / Self Care | Payer: Medicare Other | Source: Ambulatory Visit | Attending: Internal Medicine | Admitting: Internal Medicine

## 2015-09-05 DIAGNOSIS — N63 Unspecified lump in unspecified breast: Secondary | ICD-10-CM

## 2015-09-10 DIAGNOSIS — I2510 Atherosclerotic heart disease of native coronary artery without angina pectoris: Secondary | ICD-10-CM | POA: Diagnosis not present

## 2015-09-10 DIAGNOSIS — R946 Abnormal results of thyroid function studies: Secondary | ICD-10-CM | POA: Diagnosis not present

## 2015-09-10 DIAGNOSIS — E781 Pure hyperglyceridemia: Secondary | ICD-10-CM | POA: Diagnosis not present

## 2015-09-10 DIAGNOSIS — I10 Essential (primary) hypertension: Secondary | ICD-10-CM | POA: Diagnosis not present

## 2015-09-10 DIAGNOSIS — E7800 Pure hypercholesterolemia, unspecified: Secondary | ICD-10-CM | POA: Diagnosis not present

## 2015-09-10 DIAGNOSIS — R21 Rash and other nonspecific skin eruption: Secondary | ICD-10-CM | POA: Diagnosis not present

## 2015-11-07 ENCOUNTER — Ambulatory Visit (INDEPENDENT_AMBULATORY_CARE_PROVIDER_SITE_OTHER): Payer: Medicare Other | Admitting: Cardiology

## 2015-11-07 ENCOUNTER — Encounter: Payer: Self-pay | Admitting: Cardiology

## 2015-11-07 VITALS — BP 132/74 | HR 68 | Ht 63.0 in | Wt 249.6 lb

## 2015-11-07 DIAGNOSIS — G4733 Obstructive sleep apnea (adult) (pediatric): Secondary | ICD-10-CM | POA: Diagnosis not present

## 2015-11-07 DIAGNOSIS — I25810 Atherosclerosis of coronary artery bypass graft(s) without angina pectoris: Secondary | ICD-10-CM

## 2015-11-07 DIAGNOSIS — I10 Essential (primary) hypertension: Secondary | ICD-10-CM | POA: Diagnosis not present

## 2015-11-07 DIAGNOSIS — I2510 Atherosclerotic heart disease of native coronary artery without angina pectoris: Secondary | ICD-10-CM | POA: Diagnosis not present

## 2015-11-07 DIAGNOSIS — I5032 Chronic diastolic (congestive) heart failure: Secondary | ICD-10-CM

## 2015-11-07 DIAGNOSIS — E785 Hyperlipidemia, unspecified: Secondary | ICD-10-CM

## 2015-11-07 NOTE — Patient Instructions (Signed)
Medication Instructions:  Your physician recommends that you continue on your current medications as directed. Please refer to the Current Medication list given to you today.   Labwork: Your physician recommends that you return for FASTING lab work.   Testing/Procedures: None  Follow-Up: Your physician wants you to follow-up in: 6 months with Dr. Radford Pax. You will receive a reminder letter in the mail two months in advance. If you don't receive a letter, please call our office to schedule the follow-up appointment.   Any Other Special Instructions Will Be Listed Below (If Applicable). Please call Romelle Starcher (our office CPAP assistant) directly at [phone] if you have any problems or CPAP concerns.    If you need a refill on your cardiac medications before your next appointment, please call your pharmacy.

## 2015-11-07 NOTE — Progress Notes (Signed)
Cardiology Office Note    Date:  11/07/2015   ID:  Kathryn Castillo, DOB 1950-04-25, MRN [id number]  PCP:  Kandice Hams, MD  Cardiologist:  Fransico Him, MD   No chief complaint on file.   History of Present Illness:  Kathryn Castillo is a 65 y.o. female  with a history of CAD s/p remote CABG x1 with LIMA to LAD placed in 1996, HTN, OSA on CPAP therapy and HLD who presents to clinic for followup.    Her last LHC in 2010 showed an occluded LAD to the ostium. She has had a LIMA to the LAD placed in 1996. This was patent at time of cath in 2010.   She was admitted 5/3-08/24/15 for chest pain and acute on chronic diastolic CHF. Her symptoms were concerning for unstable angina. She underwent left heart cath in 08/23/15 which showed single vessel obstructive CAD with ostial LAD occlusion; however her LIMA to LAD was widely patent. Cath was felt to be unchanged from previous in 2010. Continued medical therapy was recommended. Atenolol was increased to 100 mg daily. She was also felt to be volume overloaded and treated with IV diuresis. Discharge weight 247.   She was seen in May with volume overload and was restarted on diuretic therapy.  It was also recommended that she had a repeat sleep study done since she had a history of OSA and is on sleep apnea but has been having problems with excessive daytime sleepiness.    She presents back today doing well.  She quit smoking a year ago but has gained 35lbs.  She denies any chest pain, SOB, DOE, lightheadedness,  palptiations or syncope.  Her weight is down 4lbs from last OV.     Past Medical History  Diagnosis Date  . Hypertension   . Coronary artery disease   . Sleep apnea     uses a cpap  . Hyperlipemia   . Arthritis   . Trigger thumb of left hand 09/17/2012  . Carotid stenosis   . S/P bunionectomy     Past Surgical History  Procedure Laterality Date  . Dilation and curettage of uterus  1985  . Tubal ligation    . Knee arthroscopy with  patella reconstruction  1968    left  . Coronary artery bypass graft  1996    x1  . Nasal septoplasty w/ turbinoplasty  2008    to help tolerated cpap  . Cardiac catheterization      x3  . Trigger finger release Left 09/17/2012    Procedure: RELEASE TRIGGER FINGER/A-1 PULLEY LEFT THUMB (TENDON SHEATH INCISION);  Surgeon: Johnny Bridge, MD;  Location: [place];  Service: Orthopedics;  Laterality: Left;  . Cardiac catheterization N/A 08/23/2015    Procedure: Left Heart Cath and Cors/Grafts Angiography;  Surgeon: Peter M Martinique, MD;  Location: [place] [facility];  Service: Cardiovascular;  Laterality: N/A;    Current Medications: Outpatient Prescriptions Prior to Visit  Medication Sig Dispense Refill  . aspirin 81 MG tablet Take 81 mg by mouth at bedtime.     Marland Kitchen atenolol (TENORMIN) 100 MG tablet Take 1 tablet (100 mg total) by mouth every evening.    . calcium carbonate (OS-CAL - DOSED IN MG OF ELEMENTAL CALCIUM) 1250 MG tablet Take 1 tablet by mouth daily.    . cholecalciferol (VITAMIN D) 1000 UNITS tablet Take 1,000 Units by mouth daily.    . fish oil-omega-3 fatty acids 1000 MG capsule  Take 1,200 mg by mouth daily.    . furosemide (LASIX) 40 MG tablet Take 1 tablet (40 mg total) by mouth daily. 90 tablet 3  . ibuprofen (ADVIL,MOTRIN) 200 MG tablet Take 200 mg by mouth every 6 (six) hours as needed for mild pain or moderate pain.    Marland Kitchen loratadine (CLARITIN) 10 MG tablet Take 10 mg by mouth daily as needed for allergies.    . Multiple Vitamins-Minerals (MULTIVITAMIN WITH MINERALS) tablet Take 1 tablet by mouth daily.    . naproxen sodium (ANAPROX) 220 MG tablet Take 220 mg by mouth 2 (two) times daily as needed (pain).    . polyvinyl alcohol (LIQUIFILM TEARS) 1.4 % ophthalmic solution Place 2 drops into the right eye as needed for dry eyes. 15 mL 0  . potassium chloride SA (KLOR-CON M20) 20 MEQ tablet Take 1 tablet (20 mEq total) by mouth daily. 90 tablet 3  . simvastatin  (ZOCOR) 20 MG tablet Take 20 mg by mouth at bedtime.     No facility-administered medications prior to visit.     Allergies:   Sulfamethoxazole-trimethoprim   Social History   Social History  . Marital Status: Divorced    Spouse Name: N/A  . Number of Children: 1  . Years of Education: N/A   Social History Main Topics  . Smoking status: Former Smoker -- 1.00 packs/day for 35 years    Types: Cigarettes    Quit date: 07/30/2014  . Smokeless tobacco: None  . Alcohol Use: No  . Drug Use: No  . Sexual Activity: Not Asked   Other Topics Concern  . None   Social History Narrative   Lives alone        Family History:  The patient's family history includes Heart attack (age of onset: 65) in her father; Hyperlipidemia in her father; Hypertension in her father; Stroke in her father and mother.   ROS:   Please see the history of present illness.    ROS All other systems reviewed and are negative.   PHYSICAL EXAM:   VS:  BP 132/74 mmHg  Pulse 68  Ht 5\' 3"  (1.6 m)  Wt 249 lb 9.6 oz (113.218 kg)  BMI 44.23 kg/m2   GEN: Well nourished, well developed, in no acute distress HEENT: normal Neck: no JVD, carotid bruits, or masses Cardiac: RRR; no murmurs, rubs, or gallops,no edema.  Intact distal pulses bilaterally.  Respiratory:  clear to auscultation bilaterally, normal work of breathing GI: soft, nontender, nondistended, + BS MS: no deformity or atrophy Skin: warm and dry, no rash Neuro:  Alert and Oriented x 3, Strength and sensation are intact Psych: euthymic mood, full affect  Wt Readings from Last 3 Encounters:  11/07/15 249 lb 9.6 oz (113.218 kg)  08/31/15 252 lb 3.2 oz (114.397 kg)  08/24/15 247 lb 14.4 oz (112.447 kg)      Studies/Labs Reviewed:   EKG:  EKG is ordered today.    Recent Labs: 08/23/2015: B Natriuretic Peptide 93.4 08/24/2015: BUN 13; Creatinine, Ser 0.80; Hemoglobin 15.0; Platelets 287; Potassium 4.2; Sodium 141 08/31/2015: TSH 4.52*   Lipid  Panel    Component Value Date/Time   CHOL  10/05/2008 0615    154        ATP III CLASSIFICATION:  <200     mg/dL   Desirable  200-239  mg/dL   Borderline High  >=240    mg/dL   High          TRIG  326* 10/05/2008 0615   HDL 25* 10/05/2008 0615   CHOLHDL 6.2 10/05/2008 0615   VLDL 65* 10/05/2008 0615   LDLCALC  10/05/2008 0615    64        Total Cholesterol/HDL:CHD Risk Coronary Heart Disease Risk Table                     Men   Women  1/2 Average Risk   3.4   3.3  Average Risk       5.0   4.4  2 X Average Risk   9.6   7.1  3 X Average Risk  23.4   11.0        Use the calculated Patient Ratio above and the CHD Risk Table to determine the patient's CHD Risk.        ATP III CLASSIFICATION (LDL):  <100     mg/dL   Optimal  100-129  mg/dL   Near or Above                    Optimal  130-159  mg/dL   Borderline  160-189  mg/dL   High  >190     mg/dL   Very High    Additional studies/ records that were reviewed today include:  none    ASSESSMENT:    1. Chronic diastolic heart failure ([place])   2. Essential hypertension   3. Atherosclerosis of native coronary artery of native heart without angina pectoris   4. Obstructive sleep apnea   5. Hyperlipidemia      PLAN:  In order of problems listed above:  1.  Chronic diastolic CHF - she appears euvolemic today after getting back on diuretic therapy.  Continue BB and PRN diuretic.  2.  HTN - BP controlled on current medical regimen.  Continue BB. 3.  ASCAD s/p LIMA to LAD with last cath showing patent LIMA to LAD and no significant obstructive disease in the distal LAD.  She has not had any angina.  Continue ASA/BB/statin. 4.  OSA currently on CPAP but having excessive daytime sleepiness but is waking up some in the middle of the night.  I am going to refer her to [facility] to get a d/l on her device and new supplies.  Based on the download I will decide whether to adjust her settings or get a new split night study.  5.   Hyperlipidemia - LDL goal < 70.  Continue statin.  Check FLp and ALT.  Continue statin.     Medication Adjustments/Labs and Tests Ordered: Current medicines are reviewed at length with the patient today.  Concerns regarding medicines are outlined above.  Medication changes, Labs and Tests ordered today are listed in the Patient Instructions below.  There are no Patient Instructions on file for this visit.   Signed, Fransico Him, MD  11/07/2015 11:53 AM    [address], [place], [place]  [address] Phone: [phone]; Fax: [phone]

## 2015-11-21 ENCOUNTER — Other Ambulatory Visit (INDEPENDENT_AMBULATORY_CARE_PROVIDER_SITE_OTHER): Payer: Medicare Other

## 2015-11-21 DIAGNOSIS — E785 Hyperlipidemia, unspecified: Secondary | ICD-10-CM | POA: Diagnosis not present

## 2015-11-21 LAB — HEPATIC FUNCTION PANEL
ALBUMIN: 4.1 g/dL (ref 3.6–5.1)
ALK PHOS: 83 U/L (ref 33–130)
ALT: 58 U/L — ABNORMAL HIGH (ref 6–29)
AST: 45 U/L — AB (ref 10–35)
BILIRUBIN INDIRECT: 0.7 mg/dL (ref 0.2–1.2)
Bilirubin, Direct: 0.1 mg/dL (ref <=0.2)
TOTAL PROTEIN: 7.2 g/dL (ref 6.1–8.1)
Total Bilirubin: 0.8 mg/dL (ref 0.2–1.2)

## 2015-11-21 LAB — LIPID PANEL
CHOL/HDL RATIO: 6.3 ratio — AB (ref <=5.0)
Cholesterol: 196 mg/dL (ref 125–200)
HDL: 31 mg/dL — AB (ref >=46)
Triglycerides: 445 mg/dL — ABNORMAL HIGH (ref <150)

## 2015-11-29 ENCOUNTER — Telehealth: Payer: Self-pay

## 2015-11-29 DIAGNOSIS — E785 Hyperlipidemia, unspecified: Secondary | ICD-10-CM

## 2015-11-29 MED ORDER — FISH OIL 1000 MG PO CAPS: 4000.0000 mg | ORAL_CAPSULE | Freq: Every day | ORAL | 0 refills | Status: DC

## 2015-11-29 NOTE — Telephone Encounter (Signed)
-----   Message from Aris Georgia, [facility] sent at 11/23/2015  8:15 AM EDT ----- Pt with elevated TG and slightly elevated LFTs.  On simvastatin 20mg  daily and fish oil 1 capsule daily.  Given elevated LFTs, would increase fish oil to 4 capsules per day and recheck labs in 2 months.  Limit carbohydrates and sugars in diet.

## 2015-11-29 NOTE — Telephone Encounter (Signed)
Informed patient of results and verbal understanding expressed.  Instructed patient to INCREASE FISH OIL to 4 capsules daily. FLP and LFTs scheduled Monday, October 2nd. Patient agrees with treatment plan.

## 2015-11-30 ENCOUNTER — Telehealth: Payer: Self-pay

## 2015-11-30 NOTE — Telephone Encounter (Signed)
-----   Message from Theodoro Parma, RN sent at 11/29/2015  5:16 PM EDT ----- Regarding: Tamala Julian OV next week This patient is requesting a call from you prior to her OV 8/15. She has seen an APP and Dr. Radford Pax in the last few weeks and wants to know if she needs to come or can reschedule.  Thanks!

## 2015-11-30 NOTE — Telephone Encounter (Signed)
Pt has already cancelled her 8/15 appt with Dr.Smith. Pt sts that she is doing well and doesn't thinks she needs to be seen. Adv pt that I will place a 3-4 mo recall in Epic for her to call back to schedule an appt with Dr.Smith towards the end of the year. She is to call back to schedule an earlier appt if needed. Pt agreeable with plan and verbalized understanding.

## 2015-12-04 ENCOUNTER — Ambulatory Visit: Payer: Medicare Other | Admitting: Interventional Cardiology

## 2015-12-14 ENCOUNTER — Telehealth: Payer: Self-pay | Admitting: Cardiology

## 2015-12-14 NOTE — Telephone Encounter (Signed)
Patient called extremely frustrated because she has not received her mask. She stated she has spoken to [place] several times and dropped her sleep study off at the office, and she still has not gotten an update on supplies. Informed her another message would be sent to [facility] and Romelle Starcher will call her first thing in the morning on Monday for an update. She was grateful for assistance.

## 2015-12-14 NOTE — Telephone Encounter (Signed)
Follow Up:     Pt is very very upset,she have been waiting to hear something about her sleep machine since 11-07-15.Please,,please call her today.

## 2015-12-17 NOTE — Telephone Encounter (Signed)
Messages were sent to [facility] about this a few weeks ago.    I received a message back from them that they had the order, was processing it AND marked it as urgent.   I have heard nothing back since then from the patient or [facility] - so I was not aware that she did not have her mask.   I have called and left a message for Darlina Guys for assistance on this matter.   I have called and talked with the patient, and she is aware that I will call her when I hear back from [facility].

## 2015-12-17 NOTE — Telephone Encounter (Signed)
Per Lenna Sciara at [facility] - calls were placed to the patient on 8/18 and 8/25, no answer and no voicemail on cell phone.      They have called the patient today and scheduled an appointment with her to meet to get her stuff.  I have confirmed this with patient.

## 2016-01-21 ENCOUNTER — Other Ambulatory Visit: Payer: Medicare Other | Admitting: *Deleted

## 2016-01-21 DIAGNOSIS — E785 Hyperlipidemia, unspecified: Secondary | ICD-10-CM | POA: Diagnosis not present

## 2016-01-21 LAB — HEPATIC FUNCTION PANEL
ALT: 62 U/L — ABNORMAL HIGH (ref 6–29)
AST: 61 U/L — ABNORMAL HIGH (ref 10–35)
Albumin: 4.2 g/dL (ref 3.6–5.1)
Alkaline Phosphatase: 78 U/L (ref 33–130)
BILIRUBIN DIRECT: 0.1 mg/dL (ref <=0.2)
BILIRUBIN INDIRECT: 0.7 mg/dL (ref 0.2–1.2)
BILIRUBIN TOTAL: 0.8 mg/dL (ref 0.2–1.2)
Total Protein: 7.2 g/dL (ref 6.1–8.1)

## 2016-01-21 LAB — LIPID PANEL
CHOL/HDL RATIO: 5.9 ratio — AB (ref <=5.0)
CHOLESTEROL: 158 mg/dL (ref 125–200)
HDL: 27 mg/dL — ABNORMAL LOW (ref >=46)
LDL CALC: 57 mg/dL (ref <130)
TRIGLYCERIDES: 371 mg/dL — AB (ref <150)
VLDL: 74 mg/dL — AB (ref <30)

## 2016-01-24 ENCOUNTER — Telehealth: Payer: Self-pay

## 2016-01-24 DIAGNOSIS — E785 Hyperlipidemia, unspecified: Secondary | ICD-10-CM

## 2016-01-24 MED ORDER — ROSUVASTATIN CALCIUM 40 MG PO TABS: 40.0000 mg | ORAL_TABLET | Freq: Every day | ORAL | 11 refills | Status: DC

## 2016-01-24 NOTE — Telephone Encounter (Signed)
Informed patient of results and verbal understanding expressed.   Patient agrees to STOP SIMVASTATIN and START CRESTOR 40 mg daily. Patient understands to take fish oil 4 grams daily, limit alcohol, carbs and sugars. FLP/LFTs scheduled 1/19. Patient agrees with treatment plan.

## 2016-01-24 NOTE — Telephone Encounter (Signed)
-----   Message from Leeroy Bock, [facility] sent at 01/23/2016 12:02 PM EDT ----- LFTs seem to be chronically mildly elevated - this is not a contraindication to statins. Would actually recommend changing simvastatin to either high intensity atorvastatin or rosuvastatin given history of CAD. LDL looks like it is at goal, but high TG can falsely lower LDL readings. High intensity statin therapy will also help lower TG. See if pt is willing to switch from simvastatin to atorvastatin 80mg  or rosuvastatin 40mg  daily. Would ensure that patient is adherent to 4g of fish oil daily and also advise her to limit alcohol, carbohydrates, and sugar. Would advise rechecking lipid panel/LFTs in 3 months. If she would like to discuss any of this in person, please schedule her in lipid clinic.

## 2016-01-28 DIAGNOSIS — Z23 Encounter for immunization: Secondary | ICD-10-CM | POA: Diagnosis not present

## 2016-02-18 ENCOUNTER — Other Ambulatory Visit: Payer: Self-pay | Admitting: Cardiology

## 2016-02-18 MED ORDER — ROSUVASTATIN CALCIUM 40 MG PO TABS
40.0000 mg | ORAL_TABLET | Freq: Every day | ORAL | 3 refills | Status: DC
Start: 2016-02-18 — End: 2018-02-04

## 2016-02-29 ENCOUNTER — Other Ambulatory Visit: Payer: Self-pay | Admitting: Internal Medicine

## 2016-02-29 DIAGNOSIS — N6320 Unspecified lump in the left breast, unspecified quadrant: Secondary | ICD-10-CM

## 2016-03-10 ENCOUNTER — Other Ambulatory Visit: Payer: Medicare Other

## 2016-03-20 DIAGNOSIS — I10 Essential (primary) hypertension: Secondary | ICD-10-CM | POA: Diagnosis not present

## 2016-03-20 DIAGNOSIS — R7309 Other abnormal glucose: Secondary | ICD-10-CM | POA: Diagnosis not present

## 2016-03-20 DIAGNOSIS — Z1389 Encounter for screening for other disorder: Secondary | ICD-10-CM | POA: Diagnosis not present

## 2016-03-20 DIAGNOSIS — Z6841 Body Mass Index (BMI) 40.0 and over, adult: Secondary | ICD-10-CM | POA: Diagnosis not present

## 2016-03-20 DIAGNOSIS — I2510 Atherosclerotic heart disease of native coronary artery without angina pectoris: Secondary | ICD-10-CM | POA: Diagnosis not present

## 2016-03-20 DIAGNOSIS — E039 Hypothyroidism, unspecified: Secondary | ICD-10-CM | POA: Diagnosis not present

## 2016-03-20 DIAGNOSIS — Z0000 Encounter for general adult medical examination without abnormal findings: Secondary | ICD-10-CM | POA: Diagnosis not present

## 2016-03-20 DIAGNOSIS — E7800 Pure hypercholesterolemia, unspecified: Secondary | ICD-10-CM | POA: Diagnosis not present

## 2016-03-20 DIAGNOSIS — R21 Rash and other nonspecific skin eruption: Secondary | ICD-10-CM | POA: Diagnosis not present

## 2016-03-20 DIAGNOSIS — G4733 Obstructive sleep apnea (adult) (pediatric): Secondary | ICD-10-CM | POA: Diagnosis not present

## 2016-04-03 DIAGNOSIS — R946 Abnormal results of thyroid function studies: Secondary | ICD-10-CM | POA: Diagnosis not present

## 2016-04-03 DIAGNOSIS — E039 Hypothyroidism, unspecified: Secondary | ICD-10-CM | POA: Diagnosis not present

## 2016-04-03 DIAGNOSIS — R7301 Impaired fasting glucose: Secondary | ICD-10-CM | POA: Diagnosis not present

## 2016-04-24 ENCOUNTER — Ambulatory Visit
Admission: RE | Admit: 2016-04-24 | Discharge: 2016-04-24 | Disposition: A | Discharge status: Home / Self Care | Payer: Medicare Other | Source: Ambulatory Visit | Attending: Internal Medicine | Admitting: Internal Medicine

## 2016-04-24 DIAGNOSIS — R922 Inconclusive mammogram: Secondary | ICD-10-CM | POA: Diagnosis not present

## 2016-04-24 DIAGNOSIS — N6012 Diffuse cystic mastopathy of left breast: Secondary | ICD-10-CM | POA: Diagnosis not present

## 2016-04-24 DIAGNOSIS — N6320 Unspecified lump in the left breast, unspecified quadrant: Secondary | ICD-10-CM

## 2016-04-28 ENCOUNTER — Encounter: Payer: Self-pay | Admitting: Interventional Cardiology

## 2016-04-28 ENCOUNTER — Ambulatory Visit (INDEPENDENT_AMBULATORY_CARE_PROVIDER_SITE_OTHER): Payer: Medicare Other | Admitting: Interventional Cardiology

## 2016-04-28 VITALS — BP 130/70 | HR 54 | Ht 63.0 in | Wt 244.0 lb

## 2016-04-28 DIAGNOSIS — I5032 Chronic diastolic (congestive) heart failure: Secondary | ICD-10-CM | POA: Diagnosis not present

## 2016-04-28 DIAGNOSIS — E783 Hyperchylomicronemia: Secondary | ICD-10-CM

## 2016-04-28 DIAGNOSIS — I10 Essential (primary) hypertension: Secondary | ICD-10-CM | POA: Diagnosis not present

## 2016-04-28 DIAGNOSIS — I2510 Atherosclerotic heart disease of native coronary artery without angina pectoris: Secondary | ICD-10-CM | POA: Diagnosis not present

## 2016-04-28 NOTE — Progress Notes (Signed)
Cardiology Office Note    Date:  04/28/2016   ID:  Kathryn Castillo, DOB May 03, 1950, MRN [id number]  PCP:  Kandice Hams, MD  Cardiologist: Sinclair Grooms, MD   Chief Complaint  Patient presents with  . Coronary Artery Disease    History of Present Illness:  Kathryn Castillo is a 66 y.o. female with history of coronary artery disease, ostial LAD, restenosis of ostial LAD after PCI in 1996, and subsequent LIMA to LAD 1996.  She is doing well. She denies angina. Dyspnea improved with furosemide as did lower extremity swelling. She has started using the furosemide as needed. She has not had a dose in several weeks. She denies chest pain.  She was admitted with dyspnea and chest discomfort and may. Angiography demonstrated widely patent coronaries and LIMA to LAD which was placed in 1996.  Past Medical History:  Diagnosis Date  . Arthritis   . Carotid stenosis   . Coronary artery disease   . Hyperlipemia   . Hypertension   . S/P bunionectomy   . Sleep apnea    uses a cpap  . Trigger thumb of left hand 09/17/2012    Past Surgical History:  Procedure Laterality Date  . CARDIAC CATHETERIZATION     x3  . CARDIAC CATHETERIZATION N/A 08/23/2015   Procedure: Left Heart Cath and Cors/Grafts Angiography;  Surgeon: Peter M Martinique, MD;  Location: [place] [facility];  Service: Cardiovascular;  Laterality: N/A;  . CORONARY ARTERY BYPASS GRAFT  1996   x1  . DILATION AND CURETTAGE OF UTERUS  1985  . KNEE ARTHROSCOPY WITH PATELLA RECONSTRUCTION  1968   left  . NASAL SEPTOPLASTY W/ TURBINOPLASTY  2008   to help tolerated cpap  . TRIGGER FINGER RELEASE Left 09/17/2012   Procedure: RELEASE TRIGGER FINGER/A-1 PULLEY LEFT THUMB (TENDON SHEATH INCISION);  Surgeon: Johnny Bridge, MD;  Location: [place];  Service: Orthopedics;  Laterality: Left;  . TUBAL LIGATION      Current Medications: Outpatient Medications Prior to Visit  Medication Sig Dispense Refill  . aspirin  81 MG tablet Take 81 mg by mouth at bedtime.     Marland Kitchen atenolol (TENORMIN) 100 MG tablet Take 1 tablet (100 mg total) by mouth every evening.    . calcium carbonate (OS-CAL - DOSED IN MG OF ELEMENTAL CALCIUM) 1250 MG tablet Take 1 tablet by mouth daily.    . cholecalciferol (VITAMIN D) 1000 UNITS tablet Take 1,000 Units by mouth daily.    Marland Kitchen ibuprofen (ADVIL,MOTRIN) 200 MG tablet Take 200 mg by mouth every 6 (six) hours as needed for mild pain or moderate pain.    Marland Kitchen loratadine (CLARITIN) 10 MG tablet Take 10 mg by mouth daily as needed for allergies.    . Multiple Vitamins-Minerals (MULTIVITAMIN WITH MINERALS) tablet Take 1 tablet by mouth daily.    . naproxen sodium (ANAPROX) 220 MG tablet Take 220 mg by mouth 2 (two) times daily as needed (pain).    . Omega-3 Fatty Acids (FISH OIL) 1000 MG CAPS Take 4 capsules (4,000 mg total) by mouth daily.  0  . polyvinyl alcohol (LIQUIFILM TEARS) 1.4 % ophthalmic solution Place 2 drops into the right eye as needed for dry eyes. 15 mL 0  . rosuvastatin (CRESTOR) 40 MG tablet Take 1 tablet (40 mg total) by mouth daily. 90 tablet 3  . furosemide (LASIX) 40 MG tablet Take 1 tablet (40 mg total) by mouth daily. (Patient not taking: Reported  on 04/28/2016) 90 tablet 3  . potassium chloride SA (KLOR-CON M20) 20 MEQ tablet Take 1 tablet (20 mEq total) by mouth daily. (Patient not taking: Reported on 04/28/2016) 90 tablet 3   No facility-administered medications prior to visit.      Allergies:   Sulfamethoxazole-trimethoprim   Social History   Social History  . Marital status: Divorced    Spouse name: N/A  . Number of children: 1  . Years of education: N/A   Social History Main Topics  . Smoking status: Former Smoker    Packs/day: 1.00    Years: 35.00    Types: Cigarettes    Quit date: 07/30/2014  . Smokeless tobacco: Never Used  . Alcohol use No  . Drug use: No  . Sexual activity: Not Asked   Other Topics Concern  . None   Social History Narrative    Lives alone        Family History:  The patient's family history includes Heart attack (age of onset: 94) in her father; Hyperlipidemia in her father; Hypertension in her father; Stroke in her father and mother.   ROS:   Please see the history of present illness.    Obesity and difficulty with sleep. Sleep apnea is present and she is now compliant with CPAP.  All other systems reviewed and are negative.   PHYSICAL EXAM:   VS:  BP 130/70 (BP Location: Left Arm)   Pulse (!) 54   Ht 5\' 3"  (1.6 m)   Wt 244 lb (110.7 kg)   BMI 43.22 kg/m    GEN: Well nourished, well developed, in no acute distress  HEENT: normal  Neck: no JVD, carotid bruits, or masses Cardiac: RRR; no murmurs, rubs, or gallops,no edema  Respiratory:  clear to auscultation bilaterally, normal work of breathing GI: soft, nontender, nondistended, + BS MS: no deformity or atrophy  Skin: warm and dry, no rash Neuro:  Alert and Oriented x 3, Strength and sensation are intact Psych: euthymic mood, full affect  Wt Readings from Last 3 Encounters:  04/28/16 244 lb (110.7 kg)  11/07/15 249 lb 9.6 oz (113.2 kg)  08/31/15 252 lb 3.2 oz (114.4 kg)      Studies/Labs Reviewed:   EKG:  EKG  No new tracing is performed on today's note.  Recent Labs: 08/23/2015: B Natriuretic Peptide 93.4 08/24/2015: BUN 13; Creatinine, Ser 0.80; Hemoglobin 15.0; Platelets 287; Potassium 4.2; Sodium 141 08/31/2015: TSH 4.52 01/21/2016: ALT 62   Lipid Panel    Component Value Date/Time   CHOL 158 01/21/2016 0755   TRIG 371 (H) 01/21/2016 0755   HDL 27 (L) 01/21/2016 0755   CHOLHDL 5.9 (H) 01/21/2016 0755   VLDL 74 (H) 01/21/2016 0755   LDLCALC 57 01/21/2016 0755    Additional studies/ records that were reviewed today include:  Cardiac catheterization May 2017: Diagnostic Diagram         ASSESSMENT:    1. Atherosclerosis of native coronary artery of native heart without angina pectoris   2. Chronic diastolic heart failure  ([place])   3. Essential hypertension   4. Hyperchylomicronemia      PLAN:  In order of problems listed above:  1. Stable coronary artery disease without angina. LIMA to LAD is widely patent. No other significant coronary disease is noted. 2. Dyspnea and lower extremity edema has been significantly improved with intermittent use of Lasix. She has not currently using furosemide and feels that breathing is better 3. Target blood pressure is  less than 140/90 mmHg. She is at goal on current regimen. 4. The LDL target is less than 70. Last evaluation was 18 in October 2017.  Overall having courtesy aerobic activity, weight loss, and follow-up in one year.    Medication Adjustments/Labs and Tests Ordered: Current medicines are reviewed at length with the patient today.  Concerns regarding medicines are outlined above.  Medication changes, Labs and Tests ordered today are listed in the Patient Instructions below. Patient Instructions  Medication Instructions:  None  Labwork: None  Testing/Procedures: None  Follow-Up: Your physician wants you to follow-up in: 1 year with Dr. Tamala Julian. You will receive a reminder letter in the mail two months in advance. If you don't receive a letter, please call our office to schedule the follow-up appointment.   Any Other Special Instructions Will Be Listed Below (If Applicable).  Your goal for your LDL (bad cholesterol) is less than or equal to 70.  Your goal for your blood pressure is less than 140/90.  Please call if you see that your blood pressure is consistently above this number.    If you need a refill on your cardiac medications before your next appointment, please call your pharmacy.      Signed, Sinclair Grooms, MD  04/28/2016 9:42 AM    [facility], [place],   [address] Phone: [phone]; Fax: [phone]

## 2016-04-28 NOTE — Patient Instructions (Signed)
Medication Instructions:  None  Labwork: None  Testing/Procedures: None  Follow-Up: Your physician wants you to follow-up in: 1 year with Dr. Tamala Julian. You will receive a reminder letter in the mail two months in advance. If you don't receive a letter, please call our office to schedule the follow-up appointment.   Any Other Special Instructions Will Be Listed Below (If Applicable).  Your goal for your LDL (bad cholesterol) is less than or equal to 70.  Your goal for your blood pressure is less than 140/90.  Please call if you see that your blood pressure is consistently above this number.    If you need a refill on your cardiac medications before your next appointment, please call your pharmacy.

## 2016-05-01 DIAGNOSIS — H65491 Other chronic nonsuppurative otitis media, right ear: Secondary | ICD-10-CM | POA: Diagnosis not present

## 2016-05-01 DIAGNOSIS — H6983 Other specified disorders of Eustachian tube, bilateral: Secondary | ICD-10-CM | POA: Diagnosis not present

## 2016-05-01 DIAGNOSIS — H6981 Other specified disorders of Eustachian tube, right ear: Secondary | ICD-10-CM | POA: Diagnosis not present

## 2016-05-01 DIAGNOSIS — H90A11 Conductive hearing loss, unilateral, right ear with restricted hearing on the contralateral side: Secondary | ICD-10-CM | POA: Diagnosis not present

## 2016-05-09 ENCOUNTER — Other Ambulatory Visit: Payer: Medicare Other

## 2016-05-12 ENCOUNTER — Ambulatory Visit: Payer: Medicare Other | Admitting: Cardiology

## 2016-05-16 DIAGNOSIS — E039 Hypothyroidism, unspecified: Secondary | ICD-10-CM | POA: Diagnosis not present

## 2016-05-28 ENCOUNTER — Other Ambulatory Visit: Payer: Self-pay | Admitting: Gastroenterology

## 2016-06-03 ENCOUNTER — Ambulatory Visit: Payer: Medicare Other | Admitting: Cardiology

## 2016-06-04 ENCOUNTER — Encounter: Payer: Self-pay | Admitting: Cardiology

## 2016-06-16 NOTE — Progress Notes (Deleted)
Cardiology Office Note    Date:  06/16/2016   ID:  Kathryn Castillo, DOB May 28, 1950, MRN [id number]  PCP:  Kandice Hams, MD  Cardiologist:  Fransico Him, MD   No chief complaint on file.   History of Present Illness:  Kathryn Castillo is a 66 y.o. female with a history of CAD s/p remote CABG x1 with LIMA to LAD placed in 1996, HTN, OSA on CPAP therapy and HLD who presents to clinic for followup.  Her last LHC in 2010 showed an occluded LAD to the ostium with patent LIMA to LAD.   She was admitted 5/3-08/24/15 for chest pain and acute on chronic diastolic CHF. Her symptoms were concerning for unstable angina. She underwent left heart cath in 08/23/15 which showed single vessel obstructive CAD with ostial LAD occlusion; however her LIMA to LAD was widely patent. Cath was felt to be unchanged from previous in 2010. Continued medical therapy was recommended. Atenolol was increased to 100 mg daily. She was also felt to be volume overloaded and treated with IV diuresis. Discharge weight 247.     She was seen in May with volume overload and was restarted on diuretic therapy.    She presents back today doing well.  She quit smoking a year ago but has gained 35lbs.  She denies any chest pain, SOB, DOE, lightheadedness,  palptiations or syncope.  Her weight is down 4lbs from last OV.       Past Medical History:  Diagnosis Date  . Arthritis   . Carotid stenosis   . Coronary artery disease   . Hyperlipemia   . Hypertension   . S/P bunionectomy   . Sleep apnea    uses a cpap  . Trigger thumb of left hand 09/17/2012    Past Surgical History:  Procedure Laterality Date  . CARDIAC CATHETERIZATION     x3  . CARDIAC CATHETERIZATION N/A 08/23/2015   Procedure: Left Heart Cath and Cors/Grafts Angiography;  Surgeon: Peter M Martinique, MD;  Location: [facility];  Service: Cardiovascular;  Laterality: N/A;  . CORONARY ARTERY BYPASS GRAFT  1996   x1  . DILATION AND CURETTAGE OF UTERUS  1985    . KNEE ARTHROSCOPY WITH PATELLA RECONSTRUCTION  1968   left  . NASAL SEPTOPLASTY W/ TURBINOPLASTY  2008   to help tolerated cpap  . TRIGGER FINGER RELEASE Left 09/17/2012   Procedure: RELEASE TRIGGER FINGER/A-1 PULLEY LEFT THUMB (TENDON SHEATH INCISION);  Surgeon: Johnny Bridge, MD;  Location: [facility];  Service: Orthopedics;  Laterality: Left;  . TUBAL LIGATION      Current Medications: No outpatient prescriptions have been marked as taking for the 06/17/16 encounter (Appointment) with [name], MD.    Allergies:   Sulfamethoxazole-trimethoprim   Social History   Social History  . Marital status: Divorced    Spouse name: N/A  . Number of children: 1  . Years of education: N/A   Social History Main Topics  . Smoking status: Former Smoker    Packs/day: 1.00    Years: 35.00    Types: Cigarettes    Quit date: 07/30/2014  . Smokeless tobacco: Never Used  . Alcohol use No  . Drug use: No  . Sexual activity: Not on file   Other Topics Concern  . Not on file   Social History Narrative   Lives alone        Family History:  The patient's ***family history includes Heart  attack (age of onset: 51) in her father; Hyperlipidemia in her father; Hypertension in her father; Stroke in her father and mother.   ROS:   Please see the history of present illness.    ROS All other systems reviewed and are negative.  No flowsheet data found.     PHYSICAL EXAM:   VS:  There were no vitals taken for this visit.   GEN: Well nourished, well developed, in no acute distress  HEENT: normal  Neck: no JVD, carotid bruits, or masses Cardiac: ***RRR; no murmurs, rubs, or gallops,no edema.  Intact distal pulses bilaterally.  Respiratory:  clear to auscultation bilaterally, normal work of breathing GI: soft, nontender, nondistended, + BS MS: no deformity or atrophy  Skin: warm and dry, no rash Neuro:  Alert and Oriented x 3, Strength and sensation are intact Psych:  euthymic mood, full affect  Wt Readings from Last 3 Encounters:  04/28/16 244 lb (110.7 kg)  11/07/15 249 lb 9.6 oz (113.2 kg)  08/31/15 252 lb 3.2 oz (114.4 kg)      Studies/Labs Reviewed:   EKG:  EKG is*** ordered today.  The ekg ordered today demonstrates ***  Recent Labs: 08/23/2015: B Natriuretic Peptide 93.4 08/24/2015: BUN 13; Creatinine, Ser 0.80; Hemoglobin 15.0; Platelets 287; Potassium 4.2; Sodium 141 08/31/2015: TSH 4.52 01/21/2016: ALT 62   Lipid Panel    Component Value Date/Time   CHOL 158 01/21/2016 0755   TRIG 371 (H) 01/21/2016 0755   HDL 27 (L) 01/21/2016 0755   CHOLHDL 5.9 (H) 01/21/2016 0755   VLDL 74 (H) 01/21/2016 0755   LDLCALC 57 01/21/2016 0755    Additional studies/ records that were reviewed today include:  ***    ASSESSMENT:    No diagnosis found.   PLAN:  In order of problems listed above:  1. ***    Medication Adjustments/Labs and Tests Ordered: Current medicines are reviewed at length with the patient today.  Concerns regarding medicines are outlined above.  Medication changes, Labs and Tests ordered today are listed in the Patient Instructions below.  There are no Patient Instructions on file for this visit.   Signed, Fransico Him, MD  06/16/2016 8:28 PM    [address], [place], [place]  [address] Phone: [phone]; Fax: [phone]

## 2016-06-17 ENCOUNTER — Telehealth: Payer: Self-pay | Admitting: Cardiology

## 2016-06-17 ENCOUNTER — Ambulatory Visit: Payer: Medicare Other | Admitting: Cardiology

## 2016-06-17 NOTE — Telephone Encounter (Signed)
Patient has no showed or cancelled 4 appts in a row.  Please send letter of dismissal.

## 2016-06-18 ENCOUNTER — Encounter: Payer: Self-pay | Admitting: Cardiology

## 2016-07-02 DIAGNOSIS — E038 Other specified hypothyroidism: Secondary | ICD-10-CM | POA: Diagnosis not present

## 2016-07-10 NOTE — Telephone Encounter (Signed)
Spoke with Engineer, building services Estrellita Ludwig who emailed dismissal form. Form printed and given to Dr. Radford Pax to complete.

## 2016-07-11 NOTE — Telephone Encounter (Signed)
Signed paperwork given to C. Orma Render.

## 2016-07-24 ENCOUNTER — Encounter: Payer: Self-pay | Admitting: Cardiology

## 2016-07-24 NOTE — Telephone Encounter (Signed)
Per Georgana Curio, patient is to have one more chance prior to dismissal from Dr. Theodosia Blender care. She has an appointment scheduled 4/23.

## 2016-08-08 DIAGNOSIS — M25572 Pain in left ankle and joints of left foot: Secondary | ICD-10-CM | POA: Diagnosis not present

## 2016-08-08 DIAGNOSIS — M1711 Unilateral primary osteoarthritis, right knee: Secondary | ICD-10-CM | POA: Diagnosis not present

## 2016-08-11 ENCOUNTER — Ambulatory Visit: Payer: Medicare Other | Admitting: Cardiology

## 2016-08-21 DIAGNOSIS — E039 Hypothyroidism, unspecified: Secondary | ICD-10-CM | POA: Diagnosis not present

## 2016-08-31 ENCOUNTER — Encounter: Payer: Self-pay | Admitting: Cardiology

## 2016-08-31 NOTE — Progress Notes (Deleted)
Cardiology Office Note    Date:  08/31/2016   ID:  Kathryn Castillo, DOB 1951/04/01, MRN [id number]  PCP:  Seward Carol, MD  Cardiologist:  Fransico Him, MD   Chief Complaint  Patient presents with  . Hypertension  . Sleep Apnea    History of Present Illness:  Kathryn Castillo is a 66 y.o. female with a history of CAD s/p remote CABG x1 with LIMA to LAD placed in 1996, HTN, OSA on CPAP therapy .  She has not been seen for a while for her OSA,    She presents back today doing well.        Past Medical History:  Diagnosis Date  . Arthritis   . Carotid stenosis   . Coronary artery disease    s/p remote CABG x1 with LIMA to LAD placed in 1996.   LHC in 2010 showed an occluded LAD to the ostium and patent LIMA to the LAD.  Repeat left heart cath in 08/23/15 in setting of angina and acute diastolic CHF showed single vessel obstructive CAD with ostial LAD occlusion and  LIMA to LAD was widely patent.  . Hyperlipemia   . Hypertension   . S/P bunionectomy   . Sleep apnea    uses a cpap  . Trigger thumb of left hand 09/17/2012    Past Surgical History:  Procedure Laterality Date  . CARDIAC CATHETERIZATION     x3  . CARDIAC CATHETERIZATION N/A 08/23/2015   Procedure: Left Heart Cath and Cors/Grafts Angiography;  Surgeon: Peter M Martinique, MD;  Location: [facility];  Service: Cardiovascular;  Laterality: N/A;  . CORONARY ARTERY BYPASS GRAFT  1996   x1  . DILATION AND CURETTAGE OF UTERUS  1985  . KNEE ARTHROSCOPY WITH PATELLA RECONSTRUCTION  1968   left  . NASAL SEPTOPLASTY W/ TURBINOPLASTY  2008   to help tolerated cpap  . TRIGGER FINGER RELEASE Left 09/17/2012   Procedure: RELEASE TRIGGER FINGER/A-1 PULLEY LEFT THUMB (TENDON SHEATH INCISION);  Surgeon: Johnny Bridge, MD;  Location: [facility];  Service: Orthopedics;  Laterality: Left;  . TUBAL LIGATION      Current Medications: No outpatient prescriptions have been marked as taking for the 09/01/16  encounter (Office Visit) with [name], MD.    Allergies:   Sulfamethoxazole-trimethoprim   Social History   Social History  . Marital status: Divorced    Spouse name: N/A  . Number of children: 1  . Years of education: N/A   Social History Main Topics  . Smoking status: Former Smoker    Packs/day: 1.00    Years: 35.00    Types: Cigarettes    Quit date: 07/30/2014  . Smokeless tobacco: Never Used  . Alcohol use No  . Drug use: No  . Sexual activity: Not Asked   Other Topics Concern  . None   Social History Narrative   Lives alone        Family History:  The patient's family history includes Heart attack (age of onset: 50) in her father; Hyperlipidemia in her father; Hypertension in her father; Stroke in her father and mother.   ROS:   Please see the history of present illness.    ROS All other systems reviewed and are negative.  No flowsheet data found.     PHYSICAL EXAM:   VS:  There were no vitals taken for this visit.   GEN: Well nourished, well developed, in no acute distress  HEENT: normal  Neck: no JVD, carotid bruits, or masses Cardiac: RRR; no murmurs, rubs, or gallops,no edema.  Intact distal pulses bilaterally.  Respiratory:  clear to auscultation bilaterally, normal work of breathing GI: soft, nontender, nondistended, + BS MS: no deformity or atrophy  Skin: warm and dry, no rash Neuro:  Alert and Oriented x 3, Strength and sensation are intact Psych: euthymic mood, full affect  Wt Readings from Last 3 Encounters:  04/28/16 244 lb (110.7 kg)  11/07/15 249 lb 9.6 oz (113.2 kg)  08/31/15 252 lb 3.2 oz (114.4 kg)      Studies/Labs Reviewed:   EKG:  EKG is ordered today.  The ekg ordered today demonstrates ***  Recent Labs: 01/21/2016: ALT 62   Lipid Panel    Component Value Date/Time   CHOL 158 01/21/2016 0755   TRIG 371 (H) 01/21/2016 0755   HDL 27 (L) 01/21/2016 0755   CHOLHDL 5.9 (H) 01/21/2016 0755   VLDL 74 (H) 01/21/2016  0755   LDLCALC 57 01/21/2016 0755    Additional studies/ records that were reviewed today include:  none    ASSESSMENT:    1. Obstructive sleep apnea   2. Essential hypertension      PLAN:  In order of problems listed above:  1.  OSA - the patient is tolerating PAP therapy well without any problems. The PAP download was reviewed today and showed an AHI of ***/hr on *** cm H2O with ***% compliance in using more than 4 hours nightly.  The patient has been using and benefiting from CPAP use and will continue to benefit from therapy.   2.  HTN - Her BP is adequately controlled today.  She will continue on Atenolol.    Medication Adjustments/Labs and Tests Ordered: Current medicines are reviewed at length with the patient today.  Concerns regarding medicines are outlined above.  Medication changes, Labs and Tests ordered today are listed in the Patient Instructions below.  There are no Patient Instructions on file for this visit.   Signed, Fransico Him, MD  08/31/2016 6:03 PM    [facility] [place], [place],   [address] Phone: [phone]; Fax: [phone]

## 2016-09-01 ENCOUNTER — Encounter: Payer: Medicare Other | Admitting: Cardiology

## 2016-09-02 ENCOUNTER — Encounter: Payer: Self-pay | Admitting: Cardiology

## 2016-09-02 NOTE — Progress Notes (Signed)
This encounter was created in error - please disregard.

## 2016-09-03 ENCOUNTER — Encounter: Payer: Self-pay | Admitting: Cardiology

## 2016-09-03 DIAGNOSIS — H04123 Dry eye syndrome of bilateral lacrimal glands: Secondary | ICD-10-CM | POA: Diagnosis not present

## 2016-09-03 DIAGNOSIS — H2513 Age-related nuclear cataract, bilateral: Secondary | ICD-10-CM | POA: Diagnosis not present

## 2016-09-19 ENCOUNTER — Encounter: Payer: Self-pay | Admitting: Cardiology

## 2016-09-22 ENCOUNTER — Telehealth: Payer: Self-pay | Admitting: Cardiology

## 2016-09-22 NOTE — Telephone Encounter (Signed)
Patient dismissed by Fransico Him MD, effective September 19, 2016. Dismissal letter sent out by certified / registered mail.  daj

## 2016-09-30 NOTE — Telephone Encounter (Signed)
Received signed domestic return receipt verifying delivery of certified letter on September 26, 2016. Article number [id number] [phone] DJS

## 2016-10-01 NOTE — Telephone Encounter (Signed)
New Message     Pt called 10/01/16 about dismissal letter, we offered for Patient to see Dr Claiborne Billings at [facility] office, patient refused said we are stupid and will find another doctor.

## 2016-10-02 DIAGNOSIS — H2512 Age-related nuclear cataract, left eye: Secondary | ICD-10-CM | POA: Diagnosis not present

## 2016-10-02 HISTORY — PX: EYE SURGERY: SHX253

## 2016-10-07 DIAGNOSIS — I2510 Atherosclerotic heart disease of native coronary artery without angina pectoris: Secondary | ICD-10-CM | POA: Diagnosis not present

## 2016-10-07 DIAGNOSIS — E7800 Pure hypercholesterolemia, unspecified: Secondary | ICD-10-CM | POA: Diagnosis not present

## 2016-10-07 DIAGNOSIS — I10 Essential (primary) hypertension: Secondary | ICD-10-CM | POA: Diagnosis not present

## 2016-10-07 DIAGNOSIS — R7301 Impaired fasting glucose: Secondary | ICD-10-CM | POA: Diagnosis not present

## 2016-10-07 DIAGNOSIS — E039 Hypothyroidism, unspecified: Secondary | ICD-10-CM | POA: Diagnosis not present

## 2016-10-07 DIAGNOSIS — G4733 Obstructive sleep apnea (adult) (pediatric): Secondary | ICD-10-CM | POA: Diagnosis not present

## 2016-10-08 ENCOUNTER — Encounter (HOSPITAL_COMMUNITY): Payer: Self-pay | Admitting: *Deleted

## 2016-10-13 ENCOUNTER — Ambulatory Visit (HOSPITAL_COMMUNITY)
Admission: RE | Admit: 2016-10-13 | Discharge: 2016-10-13 | Disposition: A | Discharge status: Home / Self Care | Payer: Medicare Other | Source: Ambulatory Visit | Attending: Gastroenterology | Admitting: Gastroenterology

## 2016-10-13 ENCOUNTER — Ambulatory Visit (HOSPITAL_COMMUNITY): Payer: Medicare Other | Admitting: Anesthesiology

## 2016-10-13 ENCOUNTER — Encounter (HOSPITAL_COMMUNITY): Payer: Self-pay | Admitting: *Deleted

## 2016-10-13 ENCOUNTER — Encounter (HOSPITAL_COMMUNITY)
Admission: RE | Disposition: A | Discharge status: Home / Self Care | Payer: Self-pay | Source: Ambulatory Visit | Attending: Gastroenterology

## 2016-10-13 DIAGNOSIS — Z888 Allergy status to other drugs, medicaments and biological substances status: Secondary | ICD-10-CM | POA: Diagnosis not present

## 2016-10-13 DIAGNOSIS — K589 Irritable bowel syndrome without diarrhea: Secondary | ICD-10-CM | POA: Insufficient documentation

## 2016-10-13 DIAGNOSIS — M170 Bilateral primary osteoarthritis of knee: Secondary | ICD-10-CM | POA: Diagnosis not present

## 2016-10-13 DIAGNOSIS — Z1211 Encounter for screening for malignant neoplasm of colon: Secondary | ICD-10-CM | POA: Insufficient documentation

## 2016-10-13 DIAGNOSIS — Z87891 Personal history of nicotine dependence: Secondary | ICD-10-CM | POA: Insufficient documentation

## 2016-10-13 DIAGNOSIS — G4733 Obstructive sleep apnea (adult) (pediatric): Secondary | ICD-10-CM | POA: Diagnosis not present

## 2016-10-13 DIAGNOSIS — J309 Allergic rhinitis, unspecified: Secondary | ICD-10-CM | POA: Diagnosis not present

## 2016-10-13 DIAGNOSIS — I2510 Atherosclerotic heart disease of native coronary artery without angina pectoris: Secondary | ICD-10-CM | POA: Insufficient documentation

## 2016-10-13 DIAGNOSIS — N6019 Diffuse cystic mastopathy of unspecified breast: Secondary | ICD-10-CM | POA: Insufficient documentation

## 2016-10-13 DIAGNOSIS — M810 Age-related osteoporosis without current pathological fracture: Secondary | ICD-10-CM | POA: Diagnosis not present

## 2016-10-13 DIAGNOSIS — Z951 Presence of aortocoronary bypass graft: Secondary | ICD-10-CM | POA: Diagnosis not present

## 2016-10-13 DIAGNOSIS — E7800 Pure hypercholesterolemia, unspecified: Secondary | ICD-10-CM | POA: Insufficient documentation

## 2016-10-13 DIAGNOSIS — I10 Essential (primary) hypertension: Secondary | ICD-10-CM | POA: Diagnosis not present

## 2016-10-13 DIAGNOSIS — I25119 Atherosclerotic heart disease of native coronary artery with unspecified angina pectoris: Secondary | ICD-10-CM | POA: Diagnosis not present

## 2016-10-13 DIAGNOSIS — Z880 Allergy status to penicillin: Secondary | ICD-10-CM | POA: Insufficient documentation

## 2016-10-13 DIAGNOSIS — E785 Hyperlipidemia, unspecified: Secondary | ICD-10-CM | POA: Diagnosis not present

## 2016-10-13 DIAGNOSIS — Z86010 Personal history of colonic polyps: Secondary | ICD-10-CM | POA: Diagnosis not present

## 2016-10-13 HISTORY — PX: COLONOSCOPY WITH PROPOFOL: SHX5780

## 2016-10-13 HISTORY — DX: Hypothyroidism, unspecified: E03.9

## 2016-10-13 SURGERY — COLONOSCOPY WITH PROPOFOL
Anesthesia: Monitor Anesthesia Care

## 2016-10-13 MED ORDER — PROPOFOL 10 MG/ML IV BOLUS
INTRAVENOUS | Status: AC
  Filled 2016-10-13: qty 40

## 2016-10-13 MED ORDER — LIDOCAINE 2% (20 MG/ML) 5 ML SYRINGE
INTRAMUSCULAR | Status: AC
  Filled 2016-10-13: qty 5

## 2016-10-13 MED ORDER — SODIUM CHLORIDE 0.9 % IV SOLN: INTRAVENOUS | Status: DC

## 2016-10-13 MED ORDER — ONDANSETRON HCL 4 MG/2ML IJ SOLN
INTRAMUSCULAR | Status: AC
  Filled 2016-10-13: qty 2

## 2016-10-13 MED ORDER — PROPOFOL 10 MG/ML IV BOLUS
INTRAVENOUS | Status: DC | PRN
  Administered 2016-10-13 (×7): 50 mg via INTRAVENOUS

## 2016-10-13 MED ORDER — ONDANSETRON HCL 4 MG/2ML IJ SOLN
INTRAMUSCULAR | Status: DC | PRN
  Administered 2016-10-13: 4 mg via INTRAVENOUS

## 2016-10-13 MED ORDER — LIDOCAINE 2% (20 MG/ML) 5 ML SYRINGE
INTRAMUSCULAR | Status: DC | PRN
  Administered 2016-10-13: 40 mg via INTRAVENOUS

## 2016-10-13 MED ORDER — LACTATED RINGERS IV SOLN
INTRAVENOUS | Status: DC
  Administered 2016-10-13: 13:00:00 via INTRAVENOUS

## 2016-10-13 SURGICAL SUPPLY — 21 items

## 2016-10-13 NOTE — Anesthesia Preprocedure Evaluation (Signed)
Anesthesia Evaluation  Patient identified by MRN, date of birth, ID band Patient awake    Reviewed: Allergy & Precautions, NPO status , Patient's Chart, lab work & pertinent test results  Airway Mallampati: II  TM Distance: >3 FB Neck ROM: Full    Dental no notable dental hx. (+) Edentulous Upper, Edentulous Lower   Pulmonary neg pulmonary ROS, former smoker,    Pulmonary exam normal breath sounds clear to auscultation       Cardiovascular hypertension, negative cardio ROS Normal cardiovascular exam Rhythm:Regular Rate:Normal     Neuro/Psych negative neurological ROS  negative psych ROS   GI/Hepatic negative GI ROS, Neg liver ROS,   Endo/Other  negative endocrine ROS  Renal/GU negative Renal ROS  negative genitourinary   Musculoskeletal negative musculoskeletal ROS (+)   Abdominal   Peds negative pediatric ROS (+)  Hematology negative hematology ROS (+)   Anesthesia Other Findings   Reproductive/Obstetrics negative OB ROS                             Anesthesia Physical Anesthesia Plan  ASA: III  Anesthesia Plan: MAC   Post-op Pain Management:    Induction: Intravenous  PONV Risk Score and Plan: 2 and Ondansetron, Propofol and Treatment may vary due to age or medical condition  Airway Management Planned: Nasal Cannula  Additional Equipment:   Intra-op Plan:   Post-operative Plan: Extubation in OR  Informed Consent: I have reviewed the patients History and Physical, chart, labs and discussed the procedure including the risks, benefits and alternatives for the proposed anesthesia with the patient or authorized representative who has indicated his/her understanding and acceptance.   Dental advisory given  Plan Discussed with: CRNA  Anesthesia Plan Comments:         Anesthesia Quick Evaluation

## 2016-10-13 NOTE — Discharge Instructions (Signed)

## 2016-10-13 NOTE — Transfer of Care (Signed)
Immediate Anesthesia Transfer of Care Note  Patient: Kathryn Castillo  Procedure(s) Performed: Procedure(s): COLONOSCOPY WITH PROPOFOL (N/A)  Patient Location: PACU and Endoscopy Unit  Anesthesia Type:MAC  Level of Consciousness: awake and alert   Airway & Oxygen Therapy: Patient Spontanous Breathing and Patient connected to face mask oxygen  Post-op Assessment: Report given to RN and Post -op Vital signs reviewed and stable  Post vital signs: Reviewed and stable  Last Vitals:  Vitals:   10/13/16 1135  BP: (!) 144/51  Pulse: (!) 52  Resp: 12  Temp: 36.5 C    Last Pain:  Vitals:   10/13/16 1135  TempSrc: Oral         Complications: No apparent anesthesia complications

## 2016-10-13 NOTE — Op Note (Signed)
[facility] Patient Name: Kathryn Castillo Procedure Date: 10/13/2016 MRN: [id number] Attending MD: Garlan Fair , MD Date of Birth: [birth year] CSN: [id number] Age: 66 Admit Type: Outpatient Procedure:                Colonoscopy Indications:              High risk colon cancer surveillance: 07/01/2010                            colonoscopy was performed with removal of two small                            tubular adenomatous colon polyps and small                            hyperplastic colon polyp Providers:                Garlan Fair, MD, Lillie Fragmin, RN, Alan Mulder, Technician Referring MD:              Medicines:                Propofol per Anesthesia Complications:            No immediate complications. Estimated Blood Loss:     Estimated blood loss: none. Procedure:                Pre-Anesthesia Assessment:                           - Prior to the procedure, a History and Physical                            was performed, and patient medications and                            allergies were reviewed. The patient's tolerance of                            previous anesthesia was also reviewed. The risks                            and benefits of the procedure and the sedation                            options and risks were discussed with the patient.                            All questions were answered, and informed consent                            was obtained. Prior Anticoagulants: The patient has                            taken aspirin,  last dose was day of procedure. ASA                            Grade Assessment: III - A patient with severe                            systemic disease. After reviewing the risks and                            benefits, the patient was deemed in satisfactory                            condition to undergo the procedure.                           After obtaining informed consent,  the colonoscope                            was passed under direct vision. Throughout the                            procedure, the patient's blood pressure, pulse, and                            oxygen saturations were monitored continuously. The                            EC-[id number] ([id number]) scope was introduced through                            the anus and advanced to the the cecum, identified                            by appendiceal orifice and ileocecal valve. The                            colonoscopy was somewhat difficult due to                            significant looping. The patient tolerated the                            procedure well. The quality of the bowel                            preparation was good. The appendiceal orifice and                            the rectum were photographed. Findings:      The perianal and digital rectal examinations were normal.      The entire examined colon appeared normal. Impression:               - The entire examined colon is normal.                           -  No specimens collected. Moderate Sedation:      N/A- Per Anesthesia Care      N/A- Per Anesthesia Care Recommendation:           - Patient has a contact number available for                            emergencies. The signs and symptoms of potential                            delayed complications were discussed with the                            patient. Return to normal activities tomorrow.                            Written discharge instructions were provided to the                            patient.                           - Repeat colonoscopy in 10 years for surveillance.                           - Resume previous diet.                           - Continue present medications. Procedure Code(s):        --- Professional ---                           R4270, Colorectal cancer screening; colonoscopy on                            individual at high risk Diagnosis  Code(s):        --- Professional ---                           Z12.11, Encounter for screening for malignant                            neoplasm of colon CPT copyright 2016 American Medical Association. All rights reserved. The codes documented in this report are preliminary and upon coder review may  be revised to meet current compliance requirements. Earle Gell, MD Garlan Fair, MD 10/13/2016 1:13:20 PM This report has been signed electronically. Number of Addenda: 0

## 2016-10-13 NOTE — H&P (Signed)
Procedure: Surveillance colonoscopy. 07/01/2010 colonoscopy was performed with removal of two small tubular adenomatous colon polyps and a small hyperplastic colon polyp.  History: The patient is a 66 year old female born 1950/12/08. She is scheduled to undergo a repeat screening colonoscopy today  Past medical history: Coronary artery disease. Coronary artery bypass grafting was performed in 1996. Hypertension. Hypercholesterolemia. Obstructive sleep apnea. Allergic rhinitis. Irritable bowel syndrome. Fibrocystic breast disease. Osteoporosis. Osteoarthritis of the knees. Left knee surgery. Bilateral tympanoplasties. Tubal ligation. Surgical removal of the left knee. Left thumb trigger finger release.  Medication allergies: Penicillin and imdur  Exam: The patient is alert and lying comfortably on the endoscopy stretcher. Abdomen is soft and nontender to palpation. Lungs are clear to auscultation. Cardiac exam reveals a regular rhythm.  Plan: Proceed with surveillance colonoscopy

## 2016-10-13 NOTE — Anesthesia Postprocedure Evaluation (Signed)
Anesthesia Post Note  Patient: Kathryn Castillo  Procedure(s) Performed: Procedure(s) (LRB): COLONOSCOPY WITH PROPOFOL (N/A)     Anesthesia Post Evaluation  Last Vitals:  Vitals:   10/13/16 1320 10/13/16 1330  BP: (!) 128/51 (!) 148/51  Pulse: (!) 56 (!) 54  Resp: 16 13  Temp:      Last Pain:  Vitals:   10/13/16 1316  TempSrc: Oral                 Sterlin Knightly

## 2016-10-14 ENCOUNTER — Encounter (HOSPITAL_COMMUNITY): Payer: Self-pay | Admitting: Gastroenterology

## 2016-10-27 NOTE — Telephone Encounter (Signed)
Block appts slots with Dr. Fransico Him has been processed. daj

## 2016-10-28 DIAGNOSIS — H2511 Age-related nuclear cataract, right eye: Secondary | ICD-10-CM | POA: Diagnosis not present

## 2016-10-28 NOTE — Telephone Encounter (Signed)
Note: Patient has been dismissed from Dr. Theodosia Blender practice for sleep apnea, however, the patient may still be seen by Dr. Tamala Julian for her cardiac care.

## 2016-10-30 DIAGNOSIS — H2511 Age-related nuclear cataract, right eye: Secondary | ICD-10-CM | POA: Diagnosis not present

## 2016-10-30 DIAGNOSIS — H268 Other specified cataract: Secondary | ICD-10-CM | POA: Diagnosis not present

## 2016-11-05 ENCOUNTER — Telehealth: Payer: Self-pay | Admitting: Cardiology

## 2016-11-05 DIAGNOSIS — R748 Abnormal levels of other serum enzymes: Secondary | ICD-10-CM | POA: Diagnosis not present

## 2016-11-05 NOTE — Telephone Encounter (Signed)
Patient called regarding the discharge from Dr.Turner's service.  She stated she had 6 family members die since March of this year.  She admitted to cancellation of appts, rescheduling and at least one no show that she remembered.  When she called to reschedule she was told that Dr. Radford Pax would not see her.  Dr. Delfina Redwood her PCP's office also called trying to get her back in but was refused.  Dr. Tamala Julian is her Cardiologist but she would like to continue to see Dr. Radford Pax for her Sleep Apnea.  Please call her and let her know what the outcome of this conversation.

## 2016-11-07 NOTE — Telephone Encounter (Signed)
ok 

## 2016-11-07 NOTE — Telephone Encounter (Signed)
Dr. Claiborne Billings,  Dr. Radford Pax dismissed this patient from her practice for  reoccurring no shows and late notice of cancellations. With your consent, I can offer her the option to see you for her sleep apenea.  The patient see Dr. Tamala Julian for other cardiac care.  Please confirm your willingness to accept this patient for sleep.

## 2016-11-07 NOTE — Telephone Encounter (Signed)
I called the patient to encourage her to schedule an appointment with Dr. Claiborne Billings for her sleep apnea needs and she wouldn't commit to seeing Dr. Claiborne Billings but instead asked me multiple times if I would talk to Dr. Radford Pax. Ms. Carmelina Noun wants to make sure that Dr. Radford Pax knows that the patient's life has been "upside down" and she doesn't usually miss appointments like that. She stated there have been multiple deaths in her family and she herself has had 3 procedures this year - she's requesting an appointment to talk to Dr. Radford Pax and is requesting that Dr. Radford Pax retract her dismissal.  I reminded the patient that the certified mail she signed for was a letter than gave her a 30 days notice to find another provider per our patient dismissal policy. I reminded the patient that provider has the authority to make these determinations and that others including myself cannot disregard the doctor's decision and schedule the patient without Dr. Theodosia Blender permission.   I told the patient that back in February there are notes where we asked Dr. Radford Pax to give her one more chance before we dismiss her. The patient said that nobody ever told her she was getting "one last chance" so she didn't realize that she was in jeopardy of being dismissed until she received the letter in the mail.  I tried some more to encourage Ms. Aguado to see Dr. Claiborne Billings. I informed her that [facility] only has 2 providers within our practice that can see her for sleep needs and she continued to be very reluctant to see Dr. Claiborne Billings. I confirmed with her that Dr. Claiborne Billings is in our same practice even though he is at a different location and she declined my offer to schedule her to see him. She stated that she lives 5 minutes from [facility] and she schedules all of her appointments as close to home as possible. I informed the patient that at this time Dr. Evette Georges next available sleep clinic appointment is 2 months out (September 27th) - she would not commit to  this offer but instead continued to ask me if anyone had given details of her situation to Dr. Radford Pax for reconsideration.   Dr. Radford Pax, after reviewing this patient's comments about her life being "upside down due to deaths and 3 procedures", do you still feel it's best that this patient seek care from another provider? I will call the patient next week with your feedback.

## 2016-11-08 NOTE — Telephone Encounter (Signed)
She has had at least 3 no shows where she could have called and let us know her situation . She has had at least 5 cancellations as well.  I will not see her.  She needs to see Dr. Claiborne Billings

## 2016-11-10 NOTE — Telephone Encounter (Signed)
I called the patient to confirm Dr. Theodosia Blender decision for dismissal.   I encouraged the patient one last time to schedule and appointment with Dr. Claiborne Billings and her response before she hung up was "okay, well I'll find another doctor then. Thank you."

## 2016-11-13 DIAGNOSIS — R740 Nonspecific elevation of levels of transaminase and lactic acid dehydrogenase [LDH]: Secondary | ICD-10-CM | POA: Diagnosis not present

## 2016-11-13 DIAGNOSIS — R7989 Other specified abnormal findings of blood chemistry: Secondary | ICD-10-CM | POA: Diagnosis not present

## 2016-11-18 ENCOUNTER — Other Ambulatory Visit: Payer: Self-pay | Admitting: Internal Medicine

## 2016-11-18 DIAGNOSIS — R945 Abnormal results of liver function studies: Principal | ICD-10-CM

## 2016-11-18 DIAGNOSIS — R7989 Other specified abnormal findings of blood chemistry: Secondary | ICD-10-CM

## 2016-11-24 ENCOUNTER — Other Ambulatory Visit: Payer: Medicare Other

## 2016-11-28 ENCOUNTER — Other Ambulatory Visit: Payer: Self-pay | Admitting: Internal Medicine

## 2016-11-28 ENCOUNTER — Ambulatory Visit
Admission: RE | Admit: 2016-11-28 | Discharge: 2016-11-28 | Disposition: A | Discharge status: Home / Self Care | Payer: Medicare Other | Source: Ambulatory Visit | Attending: Internal Medicine | Admitting: Internal Medicine

## 2016-11-28 DIAGNOSIS — R7989 Other specified abnormal findings of blood chemistry: Secondary | ICD-10-CM

## 2016-11-28 DIAGNOSIS — R945 Abnormal results of liver function studies: Principal | ICD-10-CM

## 2017-01-06 DIAGNOSIS — Z85828 Personal history of other malignant neoplasm of skin: Secondary | ICD-10-CM | POA: Diagnosis not present

## 2017-01-06 DIAGNOSIS — L820 Inflamed seborrheic keratosis: Secondary | ICD-10-CM | POA: Diagnosis not present

## 2017-01-06 DIAGNOSIS — D485 Neoplasm of uncertain behavior of skin: Secondary | ICD-10-CM | POA: Diagnosis not present

## 2017-01-27 ENCOUNTER — Other Ambulatory Visit: Payer: Self-pay | Admitting: Obstetrics & Gynecology

## 2017-01-27 DIAGNOSIS — Z6841 Body Mass Index (BMI) 40.0 and over, adult: Secondary | ICD-10-CM | POA: Diagnosis not present

## 2017-01-27 DIAGNOSIS — N9069 Other specified hypertrophy of vulva: Secondary | ICD-10-CM | POA: Diagnosis not present

## 2017-01-27 DIAGNOSIS — L292 Pruritus vulvae: Secondary | ICD-10-CM | POA: Diagnosis not present

## 2017-01-27 DIAGNOSIS — N898 Other specified noninflammatory disorders of vagina: Secondary | ICD-10-CM | POA: Diagnosis not present

## 2017-04-09 ENCOUNTER — Other Ambulatory Visit: Payer: Self-pay | Admitting: Internal Medicine

## 2017-04-09 DIAGNOSIS — N63 Unspecified lump in unspecified breast: Secondary | ICD-10-CM

## 2017-05-14 DIAGNOSIS — Z23 Encounter for immunization: Secondary | ICD-10-CM | POA: Diagnosis not present

## 2017-05-14 DIAGNOSIS — M25512 Pain in left shoulder: Secondary | ICD-10-CM | POA: Diagnosis not present

## 2017-05-18 DIAGNOSIS — M7542 Impingement syndrome of left shoulder: Secondary | ICD-10-CM | POA: Diagnosis not present

## 2017-05-18 DIAGNOSIS — M542 Cervicalgia: Secondary | ICD-10-CM | POA: Diagnosis not present

## 2017-05-18 DIAGNOSIS — M1711 Unilateral primary osteoarthritis, right knee: Secondary | ICD-10-CM | POA: Diagnosis not present

## 2017-05-18 DIAGNOSIS — M25572 Pain in left ankle and joints of left foot: Secondary | ICD-10-CM | POA: Diagnosis not present

## 2017-05-25 ENCOUNTER — Other Ambulatory Visit: Payer: Self-pay

## 2017-05-25 ENCOUNTER — Other Ambulatory Visit: Payer: Self-pay | Admitting: Internal Medicine

## 2017-05-25 DIAGNOSIS — N63 Unspecified lump in unspecified breast: Secondary | ICD-10-CM

## 2017-05-27 ENCOUNTER — Ambulatory Visit
Admission: RE | Admit: 2017-05-27 | Discharge: 2017-05-27 | Disposition: A | Discharge status: Home / Self Care | Payer: Medicare Other | Source: Ambulatory Visit | Attending: Internal Medicine | Admitting: Internal Medicine

## 2017-05-27 DIAGNOSIS — N63 Unspecified lump in unspecified breast: Secondary | ICD-10-CM

## 2017-05-27 DIAGNOSIS — R922 Inconclusive mammogram: Secondary | ICD-10-CM | POA: Diagnosis not present

## 2017-05-27 DIAGNOSIS — N6002 Solitary cyst of left breast: Secondary | ICD-10-CM | POA: Diagnosis not present

## 2017-05-28 DIAGNOSIS — E7800 Pure hypercholesterolemia, unspecified: Secondary | ICD-10-CM | POA: Diagnosis not present

## 2017-05-28 DIAGNOSIS — R945 Abnormal results of liver function studies: Secondary | ICD-10-CM | POA: Diagnosis not present

## 2017-05-28 DIAGNOSIS — R7303 Prediabetes: Secondary | ICD-10-CM | POA: Diagnosis not present

## 2017-05-28 DIAGNOSIS — I2510 Atherosclerotic heart disease of native coronary artery without angina pectoris: Secondary | ICD-10-CM | POA: Diagnosis not present

## 2017-05-28 DIAGNOSIS — Z1389 Encounter for screening for other disorder: Secondary | ICD-10-CM | POA: Diagnosis not present

## 2017-05-28 DIAGNOSIS — I779 Disorder of arteries and arterioles, unspecified: Secondary | ICD-10-CM | POA: Diagnosis not present

## 2017-05-28 DIAGNOSIS — E039 Hypothyroidism, unspecified: Secondary | ICD-10-CM | POA: Diagnosis not present

## 2017-05-28 DIAGNOSIS — Z23 Encounter for immunization: Secondary | ICD-10-CM | POA: Diagnosis not present

## 2017-05-28 DIAGNOSIS — Z0000 Encounter for general adult medical examination without abnormal findings: Secondary | ICD-10-CM | POA: Diagnosis not present

## 2017-05-28 DIAGNOSIS — G4733 Obstructive sleep apnea (adult) (pediatric): Secondary | ICD-10-CM | POA: Diagnosis not present

## 2017-06-02 ENCOUNTER — Encounter: Payer: Self-pay | Admitting: Cardiology

## 2017-06-08 DIAGNOSIS — H2513 Age-related nuclear cataract, bilateral: Secondary | ICD-10-CM | POA: Diagnosis not present

## 2017-06-08 DIAGNOSIS — H04123 Dry eye syndrome of bilateral lacrimal glands: Secondary | ICD-10-CM | POA: Diagnosis not present

## 2017-06-08 DIAGNOSIS — Z961 Presence of intraocular lens: Secondary | ICD-10-CM | POA: Diagnosis not present

## 2017-06-10 DIAGNOSIS — H9042 Sensorineural hearing loss, unilateral, left ear, with unrestricted hearing on the contralateral side: Secondary | ICD-10-CM | POA: Diagnosis not present

## 2017-06-10 DIAGNOSIS — H6523 Chronic serous otitis media, bilateral: Secondary | ICD-10-CM | POA: Diagnosis not present

## 2017-06-10 DIAGNOSIS — H906 Mixed conductive and sensorineural hearing loss, bilateral: Secondary | ICD-10-CM | POA: Insufficient documentation

## 2017-06-10 DIAGNOSIS — H6993 Unspecified Eustachian tube disorder, bilateral: Secondary | ICD-10-CM | POA: Diagnosis not present

## 2017-06-10 DIAGNOSIS — H9011 Conductive hearing loss, unilateral, right ear, with unrestricted hearing on the contralateral side: Secondary | ICD-10-CM | POA: Diagnosis not present

## 2017-06-11 ENCOUNTER — Telehealth: Payer: Self-pay | Admitting: *Deleted

## 2017-06-11 NOTE — Telephone Encounter (Signed)
CPAP Assistant will forward this message to [place] and cc Barry Brunner on the message.

## 2017-06-11 NOTE — Telephone Encounter (Signed)
-----   Message from [name], MD sent at 06/03/2017 11:03 AM EST ----- This patient no showed multiple times - see prior telephone encounters and I dismissed her from seeing me but had recommended that she followup with Dr. Claiborne Billings - It does not look like this was done

## 2017-09-24 DIAGNOSIS — M1711 Unilateral primary osteoarthritis, right knee: Secondary | ICD-10-CM | POA: Diagnosis not present

## 2017-10-21 DIAGNOSIS — M1711 Unilateral primary osteoarthritis, right knee: Secondary | ICD-10-CM | POA: Diagnosis not present

## 2017-10-28 DIAGNOSIS — M1711 Unilateral primary osteoarthritis, right knee: Secondary | ICD-10-CM | POA: Diagnosis not present

## 2017-11-04 DIAGNOSIS — M1711 Unilateral primary osteoarthritis, right knee: Secondary | ICD-10-CM | POA: Diagnosis not present

## 2017-11-26 DIAGNOSIS — G4733 Obstructive sleep apnea (adult) (pediatric): Secondary | ICD-10-CM | POA: Diagnosis not present

## 2017-11-26 DIAGNOSIS — I2510 Atherosclerotic heart disease of native coronary artery without angina pectoris: Secondary | ICD-10-CM | POA: Diagnosis not present

## 2017-11-26 DIAGNOSIS — E039 Hypothyroidism, unspecified: Secondary | ICD-10-CM | POA: Diagnosis not present

## 2017-11-26 DIAGNOSIS — E7800 Pure hypercholesterolemia, unspecified: Secondary | ICD-10-CM | POA: Diagnosis not present

## 2017-11-26 DIAGNOSIS — I10 Essential (primary) hypertension: Secondary | ICD-10-CM | POA: Diagnosis not present

## 2017-11-26 DIAGNOSIS — R7301 Impaired fasting glucose: Secondary | ICD-10-CM | POA: Diagnosis not present

## 2017-11-26 DIAGNOSIS — Z6841 Body Mass Index (BMI) 40.0 and over, adult: Secondary | ICD-10-CM | POA: Diagnosis not present

## 2017-11-26 DIAGNOSIS — I6529 Occlusion and stenosis of unspecified carotid artery: Secondary | ICD-10-CM | POA: Diagnosis not present

## 2018-01-12 DIAGNOSIS — Z23 Encounter for immunization: Secondary | ICD-10-CM | POA: Diagnosis not present

## 2018-02-03 NOTE — Progress Notes (Signed)
Cardiology Office Note:    Date:  02/04/2018   ID:  Kathryn Castillo, DOB 1950-09-09, MRN [id number]  PCP:  Seward Carol, MD  Cardiologist:  No primary care provider on file.   Referring MD: Seward Carol, MD   Chief Complaint  Patient presents with  . Coronary Artery Disease  . Carotid    History of Present Illness:    Kathryn Castillo is a 67 y.o. female with a hx of obstructive sleep apnea ([place]), coronary artery disease, bilateral carotid disease right greater than left 2016, ostial LAD, restenosis of ostial LAD after PCI in 1996, and subsequent LIMA to LAD 1996.  No cardiac complaints.  She does have dyspnea on exertion but is relatively sedentary.  She denies nitroglycerin use.  She denies palpitations.  There have been no transient neurological complaints.  She denies exertional leg discomfort and there is no peripheral edema.  Past Medical History:  Diagnosis Date  . Arthritis   . Carotid stenosis   . Coronary artery disease    s/p remote CABG x1 with LIMA to LAD placed in 1996.   LHC in 2010 showed an occluded LAD to the ostium and patent LIMA to the LAD.  Repeat left heart cath in 08/23/15 in setting of angina and acute diastolic CHF showed single vessel obstructive CAD with ostial LAD occlusion and  LIMA to LAD was widely patent.  . Hyperlipemia   . Hypertension   . Hypothyroidism   . S/P bunionectomy   . Sleep apnea    uses a cpap  . Trigger thumb of left hand 09/17/2012    Past Surgical History:  Procedure Laterality Date  . CARDIAC CATHETERIZATION     x3  . CARDIAC CATHETERIZATION N/A 08/23/2015   Procedure: Left Heart Cath and Cors/Grafts Angiography;  Surgeon: Peter M Martinique, MD;  Location: [facility] [facility];  Service: Cardiovascular;  Laterality: N/A;  . COLONOSCOPY WITH PROPOFOL N/A 10/13/2016   Procedure: COLONOSCOPY WITH PROPOFOL;  Surgeon: Garlan Fair, MD;  Location: [facility];  Service: Endoscopy;  Laterality: N/A;  . CORONARY ARTERY BYPASS  GRAFT  1996   x1  . DILATION AND CURETTAGE OF UTERUS  1985  . EYE SURGERY Left 10/02/2016   ioc for cataracts  . KNEE ARTHROSCOPY WITH PATELLA RECONSTRUCTION Left 1968   left  . NASAL SEPTOPLASTY W/ TURBINOPLASTY  2008   to help tolerated cpap  . TRIGGER FINGER RELEASE Left 09/17/2012   Procedure: RELEASE TRIGGER FINGER/A-1 PULLEY LEFT THUMB (TENDON SHEATH INCISION);  Surgeon: Johnny Bridge, MD;  Location: [facility];  Service: Orthopedics;  Laterality: Left;  . TUBAL LIGATION      Current Medications: Current Meds  Medication Sig  . aspirin 81 MG tablet Take 81 mg by mouth at bedtime.   Marland Kitchen atenolol (TENORMIN) 100 MG tablet Take 1 tablet (100 mg total) by mouth every evening.  . Calcium-Magnesium-Vitamin D (CALCIUM 1200+D3 PO) Take 2 tablets by mouth at bedtime.  . cholecalciferol (VITAMIN D) 1000 UNITS tablet Take 2,000 Units by mouth daily.   . furosemide (LASIX) 40 MG tablet Take 40 mg by mouth daily as needed for fluid or edema.  Marland Kitchen ibuprofen (ADVIL,MOTRIN) 200 MG tablet Take 200 mg by mouth every 6 (six) hours as needed for mild pain or moderate pain.  Marland Kitchen levothyroxine (SYNTHROID, LEVOTHROID) 100 MCG tablet Take 100 mcg by mouth daily before breakfast.  . loratadine (CLARITIN) 10 MG tablet Take 10 mg by mouth daily as  needed for allergies.  Marland Kitchen MELATONIN PO Take 4 mg by mouth at bedtime.  Marland Kitchen METFORMIN HCL PO Take 1 tablet by mouth 2 (two) times daily. Virgilio Frees of dosage)  . Multiple Vitamins-Minerals (MULTIVITAMIN WITH MINERALS) tablet Take 1 tablet by mouth at bedtime.   . naproxen sodium (ANAPROX) 220 MG tablet Take 440 mg by mouth daily as needed (pain).   . Omega-3 Fatty Acids (FISH OIL) 1000 MG CAPS Take 4 capsules (4,000 mg total) by mouth daily. (Patient taking differently: Take 3,000 mg by mouth daily. )  . potassium chloride SA (K-DUR,KLOR-CON) 20 MEQ tablet Take 20 mEq by mouth daily as needed (Take with Lasix).  Marland Kitchen PRESCRIPTION MEDICATION Prednisolone 1%  gatifloxacin 0.5% and bromfenac 0.07% eye drops Instill 1 drop into surgical eye 4 times daily for 1 week, then 3 times daily for 1 week, 2 times daily for 1 week, 1 time daily for 1 week then stop  . rosuvastatin (CRESTOR) 40 MG tablet Take 40 mg by mouth at bedtime.     Allergies:   Sulfamethoxazole-trimethoprim   Social History   Socioeconomic History  . Marital status: Divorced    Spouse name: Not on file  . Number of children: 1  . Years of education: Not on file  . Highest education level: Not on file  Occupational History  . Not on file  Social Needs  . Financial resource strain: Not on file  . Food insecurity:    Worry: Not on file    Inability: Not on file  . Transportation needs:    Medical: Not on file    Non-medical: Not on file  Tobacco Use  . Smoking status: Former Smoker    Packs/day: 1.00    Years: 35.00    Pack years: 35.00    Types: Cigarettes    Last attempt to quit: 07/30/2014    Years since quitting: 3.5  . Smokeless tobacco: Never Used  Substance and Sexual Activity  . Alcohol use: No    Alcohol/week: 0.0 standard drinks  . Drug use: No  . Sexual activity: Not on file  Lifestyle  . Physical activity:    Days per week: Not on file    Minutes per session: Not on file  . Stress: Not on file  Relationships  . Social connections:    Talks on phone: Not on file    Gets together: Not on file    Attends religious service: Not on file    Active member of club or organization: Not on file    Attends meetings of clubs or organizations: Not on file    Relationship status: Not on file  Other Topics Concern  . Not on file  Social History Narrative   Lives alone     Family History: The patient's family history includes Heart attack (age of onset: 57) in her father; Hyperlipidemia in her father; Hypertension in her father; Stroke in her father and mother.  ROS:   Please see the history of present illness.    Excessive sweating, obesity, since  stopping smoking.  All other systems reviewed and are negative.  EKGs/Labs/Other Studies Reviewed:    The following studies were reviewed today: Carotid Doppler study 2016 ordered by her primary care physician demonstrates up to 59% obstruction right carotid and up to 39% obstruction left carotid.  EKG:  EKG is  ordered today.  The ekg ordered today demonstrates sinus rhythm, incomplete right bundle, prominent voltage, otherwise unremarkable and unchanged when compared to  prior from 08/31/2015.  Recent Labs: No results found for requested labs within last 8760 hours.  Recent Lipid Panel    Component Value Date/Time   CHOL 158 01/21/2016 0755   TRIG 371 (H) 01/21/2016 0755   HDL 27 (L) 01/21/2016 0755   CHOLHDL 5.9 (H) 01/21/2016 0755   VLDL 74 (H) 01/21/2016 0755   LDLCALC 57 01/21/2016 0755    Physical Exam:    VS:  BP 132/70   Pulse (!) 50   Ht 5\' 3"  (1.6 m)   Wt 248 lb (112.5 kg)   BMI 43.93 kg/m     Wt Readings from Last 3 Encounters:  02/04/18 248 lb (112.5 kg)  10/13/16 244 lb (110.7 kg)  04/28/16 244 lb (110.7 kg)     GEN: Obese Well nourished, well developed in no acute distress HEENT: Normal NECK: No JVD. LYMPHATICS: No lymphadenopathy CARDIAC: RRR, no murmur, S4 gallop, no edema. VASCULAR: 2+ bilateral carotid and radial pulses.  Right greater than left carotid bruits. RESPIRATORY:  Clear to auscultation without rales, wheezing or rhonchi  ABDOMEN: Soft, non-tender, non-distended, No pulsatile mass, MUSCULOSKELETAL: No deformity  SKIN: Warm and dry NEUROLOGIC:  Alert and oriented x 3 PSYCHIATRIC:  Normal affect   ASSESSMENT:    1. Atherosclerosis of native coronary artery of native heart without angina pectoris   2. Chronic diastolic heart failure ([place])   3. Essential hypertension   4. Bilateral carotid artery disease, unspecified type ([place])   5. Hyperlipidemia, unspecified hyperlipidemia type    PLAN:    In order of problems listed  above:  1. She denies angina.  She has a patent LIMA to the LAD.  Most recent coronary angiogram demonstrated wide patency.  Aggressive risk factor modification to prevent progression will/should include 150 minutes of moderate aerobic activity per week, LDL less than 70, A1c less than 7, weight loss, blood pressure 130/80 mmHg, and smoking cessation.  She has had success on all of these with the exception of activity. 2. No volume overload 3. Blood pressure is well controlled 4. Repeat bilateral carotid Doppler follow-up the 2016 study 5. Continue high intensity statin therapy  Clinical follow-up with me in 1 year.  Call if angina, medication side effects, or other issues.  Aggressive secondary risk prevention.  Bilateral carotid Doppler.  Another issue is that sleep apnea is not being managed.  She has equipment.  She has been dismissed from clinic by Dr. Radford Pax because of multiple missed appointments.  We will see if she can get back in with Dr. Radford Pax otherwise will refer her to another sleep physician in the community.  The patient wanted to speak with management.   Medication Adjustments/Labs and Tests Ordered: Current medicines are reviewed at length with the patient today.  Concerns regarding medicines are outlined above.  Orders Placed This Encounter  Procedures  . EKG 12-Lead   No orders of the defined types were placed in this encounter.   Patient Instructions  Medication Instructions:  Your physician recommends that you continue on your current medications as directed. Please refer to the Current Medication list given to you today.  If you need a refill on your cardiac medications before your next appointment, please call your pharmacy.   Lab work: None If you have labs (blood work) drawn today and your tests are completely normal, you will receive your results only by: Marland Kitchen MyChart Message (if you have MyChart) OR . A paper copy in the mail If you have any  lab test that is  abnormal or we need to change your treatment, we will call you to review the results.  Testing/Procedures: Your physician has requested that you have a carotid duplex. This test is an ultrasound of the carotid arteries in your neck. It looks at blood flow through these arteries that supply the brain with blood. Allow one hour for this exam. There are no restrictions or special instructions.   Follow-Up: At [facility], you and your health needs are our priority.  As part of our continuing mission to provide you with exceptional heart care, we have created designated Provider Care Teams.  These Care Teams include your primary Cardiologist (physician) and Advanced Practice Providers (APPs -  Physician Assistants and Nurse Practitioners) who all work together to provide you with the care you need, when you need it. You will need a follow up appointment in 12 months.  Please call our office 2 months in advance to schedule this appointment.  You may see Dr. Tamala Julian or one of the following Advanced Practice Providers on your designated Care Team:   Truitt Merle, NP Cecilie Kicks, NP . Kathyrn Drown, NP  Any Other Special Instructions Will Be Listed Below (If Applicable).  Your provider recommends that you do greater than 150 minutes of moderate aerobic activity a week.  Stay active!       Signed, Sinclair Grooms, MD  02/04/2018 9:00 AM    [place]

## 2018-02-04 ENCOUNTER — Encounter: Payer: Self-pay | Admitting: Interventional Cardiology

## 2018-02-04 ENCOUNTER — Telehealth: Payer: Self-pay

## 2018-02-04 ENCOUNTER — Ambulatory Visit (INDEPENDENT_AMBULATORY_CARE_PROVIDER_SITE_OTHER): Payer: Medicare Other | Admitting: Interventional Cardiology

## 2018-02-04 VITALS — BP 132/70 | HR 50 | Ht 63.0 in | Wt 248.0 lb

## 2018-02-04 DIAGNOSIS — I5032 Chronic diastolic (congestive) heart failure: Secondary | ICD-10-CM | POA: Diagnosis not present

## 2018-02-04 DIAGNOSIS — I739 Peripheral vascular disease, unspecified: Secondary | ICD-10-CM | POA: Diagnosis not present

## 2018-02-04 DIAGNOSIS — I779 Disorder of arteries and arterioles, unspecified: Secondary | ICD-10-CM

## 2018-02-04 DIAGNOSIS — E785 Hyperlipidemia, unspecified: Secondary | ICD-10-CM | POA: Diagnosis not present

## 2018-02-04 DIAGNOSIS — I2510 Atherosclerotic heart disease of native coronary artery without angina pectoris: Secondary | ICD-10-CM

## 2018-02-04 DIAGNOSIS — I10 Essential (primary) hypertension: Secondary | ICD-10-CM

## 2018-02-04 NOTE — Patient Instructions (Signed)
Medication Instructions:  Your physician recommends that you continue on your current medications as directed. Please refer to the Current Medication list given to you today.  If you need a refill on your cardiac medications before your next appointment, please call your pharmacy.   Lab work: None If you have labs (blood work) drawn today and your tests are completely normal, you will receive your results only by: Marland Kitchen MyChart Message (if you have MyChart) OR . A paper copy in the mail If you have any lab test that is abnormal or we need to change your treatment, we will call you to review the results.  Testing/Procedures: Your physician has requested that you have a carotid duplex. This test is an ultrasound of the carotid arteries in your neck. It looks at blood flow through these arteries that supply the brain with blood. Allow one hour for this exam. There are no restrictions or special instructions.   Follow-Up: At [facility], you and your health needs are our priority.  As part of our continuing mission to provide you with exceptional heart care, we have created designated Provider Care Teams.  These Care Teams include your primary Cardiologist (physician) and Advanced Practice Providers (APPs -  Physician Assistants and Nurse Practitioners) who all work together to provide you with the care you need, when you need it. You will need a follow up appointment in 12 months.  Please call our office 2 months in advance to schedule this appointment.  You may see Dr. Tamala Julian or one of the following Advanced Practice Providers on your designated Care Team:   Truitt Merle, NP Cecilie Kicks, NP . Kathyrn Drown, NP  Any Other Special Instructions Will Be Listed Below (If Applicable).  Your provider recommends that you do greater than 150 minutes of moderate aerobic activity a week.  Stay active!

## 2018-02-04 NOTE — Telephone Encounter (Signed)
Pt in to see Dr Tamala Julian who requested clinical manager speak with patient regarding discharge from Dr Radford Pax.  Pt reports she missed several appointments with Dr Radford Pax due to multiple deaths in her family in a short period of time.  Pt states she rarely misses appointments and would like to see if Dr Radford Pax would reconsider seeing her.  Call to Dr Radford Pax who agreed to scheduling an appt for sleep followup. Pt is aware that an IT fix needs to occur before appt can be scheduled. We will call her back to schedule an appt.   Georgana Curio RN [place] CCM

## 2018-02-08 ENCOUNTER — Other Ambulatory Visit: Payer: Self-pay | Admitting: Interventional Cardiology

## 2018-02-08 ENCOUNTER — Ambulatory Visit (HOSPITAL_COMMUNITY)
Admission: RE | Admit: 2018-02-08 | Discharge: 2018-02-08 | Disposition: A | Discharge status: Home / Self Care | Payer: Medicare Other | Source: Ambulatory Visit | Attending: Cardiology | Admitting: Cardiology

## 2018-02-08 DIAGNOSIS — I6523 Occlusion and stenosis of bilateral carotid arteries: Secondary | ICD-10-CM | POA: Diagnosis not present

## 2018-02-08 DIAGNOSIS — I739 Peripheral vascular disease, unspecified: Secondary | ICD-10-CM | POA: Diagnosis not present

## 2018-02-08 DIAGNOSIS — I779 Disorder of arteries and arterioles, unspecified: Secondary | ICD-10-CM

## 2018-03-22 ENCOUNTER — Ambulatory Visit (INDEPENDENT_AMBULATORY_CARE_PROVIDER_SITE_OTHER): Payer: Medicare Other | Admitting: Cardiology

## 2018-03-22 ENCOUNTER — Encounter: Payer: Self-pay | Admitting: Cardiology

## 2018-03-22 VITALS — BP 134/70 | HR 54 | Ht 63.0 in | Wt 246.4 lb

## 2018-03-22 DIAGNOSIS — E669 Obesity, unspecified: Secondary | ICD-10-CM | POA: Diagnosis not present

## 2018-03-22 DIAGNOSIS — G4733 Obstructive sleep apnea (adult) (pediatric): Secondary | ICD-10-CM | POA: Diagnosis not present

## 2018-03-22 DIAGNOSIS — I6523 Occlusion and stenosis of bilateral carotid arteries: Secondary | ICD-10-CM

## 2018-03-22 DIAGNOSIS — I10 Essential (primary) hypertension: Secondary | ICD-10-CM | POA: Diagnosis not present

## 2018-03-22 NOTE — Patient Instructions (Signed)

## 2018-03-22 NOTE — Progress Notes (Signed)
Cardiology Office Note:    Date:  03/22/2018   ID:  [name], DOB [birth year], MRN [id number]  PCP:  Seward Carol, MD  Cardiologist:  No primary care provider on file.    Referring MD: Seward Carol, MD   Chief Complaint  Patient presents with  . Sleep Apnea  . Hypertension    History of Present Illness:    Kathryn Castillo is a 67 y.o. female with a hx of OSA, obesity and HTN.  She is doing well with her CPAP device.  She tolerates the mask and feels the pressure is adequate.  Since going on CPAP she feels rested in the am and has no significant daytime sleepiness.  She denies any significant mouth or nasal dryness or nasal congestion.  She does not think that he snores.     Past Medical History:  Diagnosis Date  . Arthritis   . Carotid stenosis   . Coronary artery disease    s/p remote CABG x1 with LIMA to LAD placed in 1996.   LHC in 2010 showed an occluded LAD to the ostium and patent LIMA to the LAD.  Repeat left heart cath in 08/23/15 in setting of angina and acute diastolic CHF showed single vessel obstructive CAD with ostial LAD occlusion and  LIMA to LAD was widely patent.  . Hyperlipemia   . Hypertension   . Hypothyroidism   . S/P bunionectomy   . Sleep apnea    uses a cpap  . Trigger thumb of left hand 09/17/2012    Past Surgical History:  Procedure Laterality Date  . CARDIAC CATHETERIZATION     x3  . CARDIAC CATHETERIZATION N/A 08/23/2015   Procedure: Left Heart Cath and Cors/Grafts Angiography;  Surgeon: Peter M Martinique, MD;  Location: [place] [facility];  Service: Cardiovascular;  Laterality: N/A;  . COLONOSCOPY WITH PROPOFOL N/A 10/13/2016   Procedure: COLONOSCOPY WITH PROPOFOL;  Surgeon: Garlan Fair, MD;  [facility]: [facility];  Service: Endoscopy;  Laterality: N/A;  . CORONARY ARTERY BYPASS GRAFT  1996   x1  . DILATION AND CURETTAGE OF UTERUS  1985  . EYE SURGERY Left 10/02/2016   ioc for cataracts  . KNEE ARTHROSCOPY WITH PATELLA  RECONSTRUCTION Left 1968   left  . NASAL SEPTOPLASTY W/ TURBINOPLASTY  2008   to help tolerated cpap  . TRIGGER FINGER RELEASE Left 09/17/2012   Procedure: RELEASE TRIGGER FINGER/A-1 PULLEY LEFT THUMB (TENDON SHEATH INCISION);  Surgeon: Johnny Bridge, MD;  Location: [place];  Service: Orthopedics;  Laterality: Left;  . TUBAL LIGATION      Current Medications: No outpatient medications have been marked as taking for the 03/22/18 encounter (Office Visit) with [name], MD.     Allergies:   Sulfamethoxazole-trimethoprim   Social History   Socioeconomic History  . Marital status: Divorced    Spouse name: Not on file  . Number of children: 1  . Years of education: Not on file  . Highest education level: Not on file  Occupational History  . Not on file  Social Needs  . Financial resource strain: Not on file  . Food insecurity:    Worry: Not on file    Inability: Not on file  . Transportation needs:    Medical: Not on file    Non-medical: Not on file  Tobacco Use  . Smoking status: Former Smoker    Packs/day: 1.00    Years: 35.00    Pack years:  35.00    Types: Cigarettes    Last attempt to quit: 07/30/2014    Years since quitting: 3.6  . Smokeless tobacco: Never Used  Substance and Sexual Activity  . Alcohol use: No    Alcohol/week: 0.0 standard drinks  . Drug use: No  . Sexual activity: Not on file  Lifestyle  . Physical activity:    Days per week: Not on file    Minutes per session: Not on file  . Stress: Not on file  Relationships  . Social connections:    Talks on phone: Not on file    Gets together: Not on file    Attends religious service: Not on file    Active member of club or organization: Not on file    Attends meetings of clubs or organizations: Not on file    Relationship status: Not on file  Other Topics Concern  . Not on file  Social History Narrative   Lives alone     Family History: The patient's family history  includes Heart attack (age of onset: 103) in her father; Hyperlipidemia in her father; Hypertension in her father; Stroke in her father and mother.  ROS:   Please see the history of present illness.    ROS  All other systems reviewed and negative.   EKGs/Labs/Other Studies Reviewed:    The following studies were reviewed today: PAP download  EKG:  EKG is not ordered today.    Recent Labs: No results found for requested labs within last 8760 hours.   Recent Lipid Panel    Component Value Date/Time   CHOL 158 01/21/2016 0755   TRIG 371 (H) 01/21/2016 0755   HDL 27 (L) 01/21/2016 0755   CHOLHDL 5.9 (H) 01/21/2016 0755   VLDL 74 (H) 01/21/2016 0755   LDLCALC 57 01/21/2016 0755    Physical Exam:    VS:  There were no vitals taken for this visit.    Wt Readings from Last 3 Encounters:  02/04/18 248 lb (112.5 kg)  10/13/16 244 lb (110.7 kg)  04/28/16 244 lb (110.7 kg)     GEN:  Well nourished, well developed in no acute distress HEENT: Normal NECK: No JVD; No carotid bruits LYMPHATICS: No lymphadenopathy CARDIAC: RRR, no murmurs, rubs, gallops RESPIRATORY:  Clear to auscultation without rales, wheezing or rhonchi  ABDOMEN: Soft, non-tender, non-distended MUSCULOSKELETAL:  No edema; No deformity  SKIN: Warm and dry NEUROLOGIC:  Alert and oriented x 3 PSYCHIATRIC:  Normal affect   ASSESSMENT:    1. Obstructive sleep apnea   2. Essential hypertension   3. Obesity (BMI 30-39.9)    PLAN:    In order of problems listed above:  1.  OSA - the patient is tolerating PAP therapy well without any problems. The PAP download was reviewed today and showed an AHI of 3/hr on auto with 89% compliance in using more than 4 hours nightly.  The patient has been using and benefiting from PAP use and will continue to benefit from therapy.   The patient's device is old and she would like to get a new one.  We will check with her DME to see if she is up for a new device and if so we will  order a ResMed CPAP on auto from 5 to 20 cm H2O with supplies.  2.  HTN -BP is controlled on exam today.  She will continue on atenolol 100 mg daily.  3.  Obesity - I have encouraged her to  get into a routine exercise program and cut back on carbs and portions.    Medication Adjustments/Labs and Tests Ordered: Current medicines are reviewed at length with the patient today.  Concerns regarding medicines are outlined above.  No orders of the defined types were placed in this encounter.  No orders of the defined types were placed in this encounter.   Signed, Fransico Him, MD  03/22/2018 11:40 AM    [place]

## 2018-03-23 ENCOUNTER — Telehealth: Payer: Self-pay | Admitting: *Deleted

## 2018-03-23 DIAGNOSIS — G4733 Obstructive sleep apnea (adult) (pediatric): Secondary | ICD-10-CM

## 2018-03-23 NOTE — Telephone Encounter (Signed)
Per [employer] patient received her cpap 08/23/2014 and is not eligible for another unit until 08/21/2019. Pt is aware and agreeable to findings.

## 2018-03-23 NOTE — Telephone Encounter (Signed)
-----   Message from Sarina Ill, RN sent at 03/22/2018  5:31 PM EST ----- Regarding: Sleep Hello, This is the patient, Dr. Radford Pax wanted to see if we could order her new CPAP if she was in the time frame. Please enter the order if the patient is eligible. The new CPAP is Resmed CPAP at 5-20 cm H2O with supplies. Thanks, [employer]

## 2018-03-23 NOTE — Telephone Encounter (Signed)
Per [employer] patient received her cpap 08/23/2014 and is not eligible for another unit until 08/21/2019. Pt is aware and agreeable to findings.  cpap supplies ordered via community message

## 2018-03-23 NOTE — Telephone Encounter (Signed)
-----   Message from Sarina Ill, RN sent at 03/22/2018  5:16 PM EST ----- Regarding: Sleep Hello, Dr. Radford Pax ordered Resmed CPAP at 5-20 cm H2O and supplies. Please send. Thanks, [employer]

## 2018-04-07 DIAGNOSIS — M1711 Unilateral primary osteoarthritis, right knee: Secondary | ICD-10-CM | POA: Diagnosis not present

## 2018-05-17 ENCOUNTER — Other Ambulatory Visit: Payer: Self-pay | Admitting: Internal Medicine

## 2018-05-17 DIAGNOSIS — Z1231 Encounter for screening mammogram for malignant neoplasm of breast: Secondary | ICD-10-CM

## 2018-06-15 ENCOUNTER — Ambulatory Visit: Payer: Medicare Other

## 2018-07-07 ENCOUNTER — Ambulatory Visit
Admission: RE | Admit: 2018-07-07 | Discharge: 2018-07-07 | Disposition: A | Discharge status: Home / Self Care | Payer: Medicare Other | Source: Ambulatory Visit | Attending: Internal Medicine | Admitting: Internal Medicine

## 2018-07-07 ENCOUNTER — Other Ambulatory Visit: Payer: Self-pay

## 2018-07-07 DIAGNOSIS — Z1231 Encounter for screening mammogram for malignant neoplasm of breast: Secondary | ICD-10-CM | POA: Diagnosis not present

## 2018-07-08 DIAGNOSIS — R748 Abnormal levels of other serum enzymes: Secondary | ICD-10-CM | POA: Diagnosis not present

## 2018-07-08 DIAGNOSIS — I6529 Occlusion and stenosis of unspecified carotid artery: Secondary | ICD-10-CM | POA: Diagnosis not present

## 2018-07-08 DIAGNOSIS — M8588 Other specified disorders of bone density and structure, other site: Secondary | ICD-10-CM | POA: Diagnosis not present

## 2018-07-08 DIAGNOSIS — E039 Hypothyroidism, unspecified: Secondary | ICD-10-CM | POA: Diagnosis not present

## 2018-07-08 DIAGNOSIS — Z0000 Encounter for general adult medical examination without abnormal findings: Secondary | ICD-10-CM | POA: Diagnosis not present

## 2018-07-08 DIAGNOSIS — I10 Essential (primary) hypertension: Secondary | ICD-10-CM | POA: Diagnosis not present

## 2018-07-08 DIAGNOSIS — G4733 Obstructive sleep apnea (adult) (pediatric): Secondary | ICD-10-CM | POA: Diagnosis not present

## 2018-07-08 DIAGNOSIS — I2510 Atherosclerotic heart disease of native coronary artery without angina pectoris: Secondary | ICD-10-CM | POA: Diagnosis not present

## 2018-07-08 DIAGNOSIS — E1169 Type 2 diabetes mellitus with other specified complication: Secondary | ICD-10-CM | POA: Diagnosis not present

## 2018-07-08 DIAGNOSIS — Z1389 Encounter for screening for other disorder: Secondary | ICD-10-CM | POA: Diagnosis not present

## 2018-07-08 DIAGNOSIS — E7800 Pure hypercholesterolemia, unspecified: Secondary | ICD-10-CM | POA: Diagnosis not present

## 2018-07-29 ENCOUNTER — Other Ambulatory Visit: Payer: Self-pay | Admitting: Internal Medicine

## 2018-07-29 DIAGNOSIS — M8580 Other specified disorders of bone density and structure, unspecified site: Secondary | ICD-10-CM

## 2018-08-11 DIAGNOSIS — R748 Abnormal levels of other serum enzymes: Secondary | ICD-10-CM | POA: Diagnosis not present

## 2018-10-05 DIAGNOSIS — M1711 Unilateral primary osteoarthritis, right knee: Secondary | ICD-10-CM | POA: Diagnosis not present

## 2018-10-05 DIAGNOSIS — M542 Cervicalgia: Secondary | ICD-10-CM | POA: Diagnosis not present

## 2018-10-05 DIAGNOSIS — M7581 Other shoulder lesions, right shoulder: Secondary | ICD-10-CM | POA: Diagnosis not present

## 2018-10-26 DIAGNOSIS — H538 Other visual disturbances: Secondary | ICD-10-CM | POA: Diagnosis not present

## 2018-10-26 DIAGNOSIS — Z961 Presence of intraocular lens: Secondary | ICD-10-CM | POA: Diagnosis not present

## 2018-11-17 ENCOUNTER — Other Ambulatory Visit: Payer: Self-pay

## 2018-11-17 ENCOUNTER — Ambulatory Visit
Admission: RE | Admit: 2018-11-17 | Discharge: 2018-11-17 | Disposition: A | Discharge status: Home / Self Care | Payer: Medicare Other | Source: Ambulatory Visit | Attending: Internal Medicine | Admitting: Internal Medicine

## 2018-11-17 DIAGNOSIS — M8589 Other specified disorders of bone density and structure, multiple sites: Secondary | ICD-10-CM | POA: Diagnosis not present

## 2018-11-17 DIAGNOSIS — M8580 Other specified disorders of bone density and structure, unspecified site: Secondary | ICD-10-CM

## 2018-11-17 DIAGNOSIS — Z780 Asymptomatic menopausal state: Secondary | ICD-10-CM | POA: Diagnosis not present

## 2019-01-10 ENCOUNTER — Other Ambulatory Visit: Payer: Self-pay | Admitting: Internal Medicine

## 2019-01-10 DIAGNOSIS — E039 Hypothyroidism, unspecified: Secondary | ICD-10-CM | POA: Diagnosis not present

## 2019-01-10 DIAGNOSIS — R42 Dizziness and giddiness: Secondary | ICD-10-CM | POA: Diagnosis not present

## 2019-01-10 DIAGNOSIS — E7800 Pure hypercholesterolemia, unspecified: Secondary | ICD-10-CM | POA: Diagnosis not present

## 2019-01-10 DIAGNOSIS — I779 Disorder of arteries and arterioles, unspecified: Secondary | ICD-10-CM | POA: Diagnosis not present

## 2019-01-10 DIAGNOSIS — Z23 Encounter for immunization: Secondary | ICD-10-CM | POA: Diagnosis not present

## 2019-01-10 DIAGNOSIS — E1169 Type 2 diabetes mellitus with other specified complication: Secondary | ICD-10-CM | POA: Diagnosis not present

## 2019-01-14 ENCOUNTER — Other Ambulatory Visit: Payer: Medicare Other

## 2019-01-18 ENCOUNTER — Ambulatory Visit
Admission: RE | Admit: 2019-01-18 | Discharge: 2019-01-18 | Disposition: A | Discharge status: Home / Self Care | Payer: Medicare Other | Source: Ambulatory Visit | Attending: Internal Medicine | Admitting: Internal Medicine

## 2019-01-18 DIAGNOSIS — I6523 Occlusion and stenosis of bilateral carotid arteries: Secondary | ICD-10-CM | POA: Diagnosis not present

## 2019-01-18 DIAGNOSIS — I779 Disorder of arteries and arterioles, unspecified: Secondary | ICD-10-CM

## 2019-01-28 DIAGNOSIS — R9439 Abnormal result of other cardiovascular function study: Secondary | ICD-10-CM | POA: Diagnosis not present

## 2019-01-31 DIAGNOSIS — D225 Melanocytic nevi of trunk: Secondary | ICD-10-CM | POA: Diagnosis not present

## 2019-01-31 DIAGNOSIS — D2262 Melanocytic nevi of left upper limb, including shoulder: Secondary | ICD-10-CM | POA: Diagnosis not present

## 2019-01-31 DIAGNOSIS — L821 Other seborrheic keratosis: Secondary | ICD-10-CM | POA: Diagnosis not present

## 2019-01-31 DIAGNOSIS — L570 Actinic keratosis: Secondary | ICD-10-CM | POA: Diagnosis not present

## 2019-01-31 DIAGNOSIS — L814 Other melanin hyperpigmentation: Secondary | ICD-10-CM | POA: Diagnosis not present

## 2019-01-31 DIAGNOSIS — Z85828 Personal history of other malignant neoplasm of skin: Secondary | ICD-10-CM | POA: Diagnosis not present

## 2019-02-02 DIAGNOSIS — E1169 Type 2 diabetes mellitus with other specified complication: Secondary | ICD-10-CM | POA: Diagnosis not present

## 2019-02-02 DIAGNOSIS — I2510 Atherosclerotic heart disease of native coronary artery without angina pectoris: Secondary | ICD-10-CM | POA: Diagnosis not present

## 2019-02-02 DIAGNOSIS — E781 Pure hyperglyceridemia: Secondary | ICD-10-CM | POA: Diagnosis not present

## 2019-02-02 DIAGNOSIS — E782 Mixed hyperlipidemia: Secondary | ICD-10-CM | POA: Diagnosis not present

## 2019-02-02 DIAGNOSIS — I10 Essential (primary) hypertension: Secondary | ICD-10-CM | POA: Diagnosis not present

## 2019-02-02 DIAGNOSIS — E039 Hypothyroidism, unspecified: Secondary | ICD-10-CM | POA: Diagnosis not present

## 2019-02-02 DIAGNOSIS — E7800 Pure hypercholesterolemia, unspecified: Secondary | ICD-10-CM | POA: Diagnosis not present

## 2019-03-15 ENCOUNTER — Encounter: Payer: Self-pay | Admitting: Vascular Surgery

## 2019-03-15 ENCOUNTER — Other Ambulatory Visit: Payer: Self-pay

## 2019-03-15 ENCOUNTER — Ambulatory Visit (INDEPENDENT_AMBULATORY_CARE_PROVIDER_SITE_OTHER): Payer: Medicare Other | Admitting: Vascular Surgery

## 2019-03-15 VITALS — BP 142/74 | HR 55 | Temp 97.2°F | Resp 20 | Ht 63.0 in | Wt 251.0 lb

## 2019-03-15 DIAGNOSIS — I771 Stricture of artery: Secondary | ICD-10-CM | POA: Diagnosis not present

## 2019-03-15 NOTE — Progress Notes (Signed)
Vascular and Vein Specialist of [facility]  Patient name: Kathryn Castillo MRN: [id number] DOB: 1950/11/15 Sex: female  REASON FOR CONSULT: Differential blood pressure with possible right subclavian stenosis  HPI: Kathryn Castillo is a 68 y.o. female, who is here today for discussion of recent noninvasive vascular lab study.  This showed a differential and blood pressure with her right arm systolic blood pressure of 102 and left arm 151.  He does have a chronic history of vertigo.  Has been seen by ENT.  This does appear to be positional and is not typical of vertebrobasilar insufficiency.  She reports that she can have the room spinning when she is at bed at night and turning her head accentuates this.  She has no history of focal neurologic deficits.  Specifically no amaurosis fugax, aphasia or stroke.  He does have a history of prior coronary artery bypass grafting with a left internal mammary graft.  Was in 1996 and she is remained stable since that time she does have a history of congestive heart failure  Past Medical History:  Diagnosis Date  . Arthritis   . Carotid stenosis   . Coronary artery disease    s/p remote CABG x1 with LIMA to LAD placed in 1996.   LHC in 2010 showed an occluded LAD to the ostium and patent LIMA to the LAD.  Repeat left heart cath in 08/23/15 in setting of angina and acute diastolic CHF showed single vessel obstructive CAD with ostial LAD occlusion and  LIMA to LAD was widely patent.  . Hyperlipemia   . Hypertension   . Hypothyroidism   . S/P bunionectomy   . Sleep apnea    uses a cpap  . Trigger thumb of left hand 09/17/2012    Family History  Problem Relation Age of Onset  . Stroke Mother   . Stroke Father   . Heart attack Father 9  . Hyperlipidemia Father   . Hypertension Father     SOCIAL HISTORY: Social History   Socioeconomic History  . Marital status: Divorced    Spouse name: Not on file  .  Number of children: 1  . Years of education: Not on file  . Highest education level: Not on file  Occupational History  . Not on file  Social Needs  . Financial resource strain: Not on file  . Food insecurity    Worry: Not on file    Inability: Not on file  . Transportation needs    Medical: Not on file    Non-medical: Not on file  Tobacco Use  . Smoking status: Former Smoker    Packs/day: 1.00    Years: 35.00    Pack years: 35.00    Types: Cigarettes    Quit date: 07/30/2014    Years since quitting: 4.6  . Smokeless tobacco: Never Used  Substance and Sexual Activity  . Alcohol use: No    Alcohol/week: 0.0 standard drinks  . Drug use: No  . Sexual activity: Not on file  Lifestyle  . Physical activity    Days per week: Not on file    Minutes per session: Not on file  . Stress: Not on file  Relationships  . Social Herbalist on phone: Not on file    Gets together: Not on file    Attends religious service: Not on file    Active member of club or organization: Not on file    Attends meetings of  clubs or organizations: Not on file    Relationship status: Not on file  . Intimate partner violence    Fear of current or ex partner: Not on file    Emotionally abused: Not on file    Physically abused: Not on file    Forced sexual activity: Not on file  Other Topics Concern  . Not on file  Social History Narrative   Lives alone    Allergies  Allergen Reactions  . Sulfamethoxazole-Trimethoprim Other (See Comments)    REACTION: "DON'T REMEMBER"    Current Outpatient Medications  Medication Sig Dispense Refill  . aspirin 81 MG tablet Take 81 mg by mouth at bedtime.     Marland Kitchen atenolol (TENORMIN) 100 MG tablet Take 1 tablet (100 mg total) by mouth every evening.    . cholecalciferol (VITAMIN D) 1000 UNITS tablet Take 2,000 Units by mouth daily.     Marland Kitchen ibuprofen (ADVIL,MOTRIN) 200 MG tablet Take 200 mg by mouth every 6 (six) hours as needed for mild pain or moderate  pain.    Marland Kitchen levothyroxine (SYNTHROID, LEVOTHROID) 100 MCG tablet Take 100 mcg by mouth daily before breakfast.    . loratadine (CLARITIN) 10 MG tablet Take 10 mg by mouth daily as needed for allergies.    Marland Kitchen meclizine (ANTIVERT) 25 MG tablet Take 25 mg by mouth every 8 (eight) hours as needed.    Marland Kitchen METFORMIN HCL PO Take 1 tablet by mouth 2 (two) times daily. Virgilio Frees of dosage)    . Multiple Vitamins-Minerals (MULTIVITAMIN WITH MINERALS) tablet Take 1 tablet by mouth at bedtime.     . Omega-3 Fatty Acids (FISH OIL) 1000 MG CAPS Take 4 capsules (4,000 mg total) by mouth daily. (Patient taking differently: Take 3,000 mg by mouth daily. )  0  . rosuvastatin (CRESTOR) 40 MG tablet Take 40 mg by mouth at bedtime.    . naproxen sodium (ANAPROX) 220 MG tablet Take 440 mg by mouth daily as needed (pain).      No current facility-administered medications for this visit.     REVIEW OF SYSTEMS:  [X]  denotes positive finding, [ ]  denotes negative finding Cardiac  Comments:  Chest pain or chest pressure:    Shortness of breath upon exertion: x   Short of breath when lying flat:    Irregular heart rhythm:        Vascular    Pain in calf, thigh, or hip brought on by ambulation:    Pain in feet at night that wakes you up from your sleep:  x   Blood clot in your veins:    Leg swelling:         Pulmonary    Oxygen at home:    Productive cough:     Wheezing:         Neurologic    Sudden weakness in arms or legs:     Sudden numbness in arms or legs:     Sudden onset of difficulty speaking or slurred speech:    Temporary loss of vision in one eye:     Problems with dizziness:  x       Gastrointestinal    Blood in stool:     Vomited blood:         Genitourinary    Burning when urinating:     Blood in urine:        Psychiatric    Major depression:         Hematologic  Bleeding problems:    Problems with blood clotting too easily:        Skin    Rashes or ulcers:        Constitutional     Fever or chills:      PHYSICAL EXAM: Vitals:   03/15/19 0825 03/15/19 0829  BP: 132/60 (!) 142/74  Pulse: (!) 55   Resp: 20   Temp: (!) 97.2 F (36.2 C)   SpO2: 93%   Weight: 251 lb (113.9 kg)   Height: 5\' 3"  (1.6 m)     GENERAL: The patient is a well-nourished female, in no acute distress. The vital signs are documented above. CARDIOVASCULAR: Carotid arteries without bruits bilaterally.  2+ radial pulses bilaterally and 2+ dorsalis pedis pulses bilaterally PULMONARY: There is good air exchange  ABDOMEN: Soft and non-tender  MUSCULOSKELETAL: There are no major deformities or cyanosis. NEUROLOGIC: No focal weakness or paresthesias are detected. SKIN: There are no ulcers or rashes noted. PSYCHIATRIC: The patient has a normal affect.  DATA:  Noninvasive studies from [facility] radiology from September of this year suggested no evidence of carotid stenosis.  She does have differential blood pressure with systolic of [id number] on the left brachial and 102 on the right brachial.  In reviewing her prior studies in noninvasive vascular labs, in 2016 and 2019 there was no differential.  MEDICAL ISSUES: Had long discussion with the patient regarding this.  I do not feel that this has any relationship to her vertigo.  I would not recommend a CT angiogram for further evaluation since I feel that she is asymptomatic.  I explained that this would not be any risk to her for stroke or other major medical problems.  She does have a left mammary graft so this would not be a concern for right subclavian stenosis even if it is present.  She was reassured with this discussion.  I suggested that she always have her blood pressure checked preferentially in both arms but certainly more reliable on the left arm.  She was reassured with this discussion will see [place] again on an as-needed basis   Rosetta Posner, MD [facility] Vascular and Vein Specialists of [facility] Tel [phone] Pager [phone]

## 2019-06-20 ENCOUNTER — Telehealth: Payer: Self-pay | Admitting: *Deleted

## 2019-06-20 NOTE — Progress Notes (Signed)
Telehealth Visit     Virtual Visit via Video Note   This visit type was conducted due to national recommendations for restrictions regarding the COVID-19 Pandemic (e.g. social distancing) in an effort to limit this patient's exposure and mitigate transmission in our community.  Due to her co-morbid illnesses, this patient is at least at moderate risk for complications without adequate follow up.  This format is felt to be most appropriate for this patient at this time.  All issues noted in this document were discussed and addressed.  A limited physical exam was performed with this format.  Please refer to the patient's chart for her consent to telehealth for [facility].   Evaluation Performed:  Follow-up visit  This visit type was conducted due to national recommendations for restrictions regarding the COVID-19 Pandemic (e.g. social distancing).  This format is felt to be most appropriate for this patient at this time.  All issues noted in this document were discussed and addressed.  No physical exam was performed (except for noted visual exam findings with Video Visits).  Please refer to the patient's chart (MyChart message for video visits and phone note for telephone visits) for the patient's consent to telehealth for [facility].  Date:  06/22/2019   ID:  Kathryn Castillo, DOB [birth year], MRN [id number]  Patient Location:  Home  Provider location:   Home  PCP:  Seward Carol, MD  Cardiologist:  Tamala Julian Electrophysiologist:  None   Chief Complaint:  Follow up   History of Present Illness:    Kathryn Castillo is a 69 y.o. female who presents via audio/video conferencing for a telehealth visit today.  Seen for Dr. Tamala Julian.   She has a history of OSA (followed by Dr. Radford Pax), CAD with remote PCI in 1996 and subsequent CABG with LIMA to LAD in 1996, bilateral carotid disease, HTN and HLD.   She was last seen by Dr. Tamala Julian in October of 2019 - pretty sedentary with associated  DOE but otherwise felt to be ok.   She had a follow up doppler study back in September - concern for right subclavian stenosis disease - was referred to Dr. Donnetta Hutching at [facility]. No evidence of carotid stenosis. She was having vertigo as well - Dr. Donnetta Hutching did not feel the difference in her BP between arms was related to her vertigo - she was reassured. Left arm BP is felt to be more reliable. She was felt to be asymptomatic from his standpoint.   The patient does not have symptoms concerning for COVID-19 infection (fever, chills, cough, or new shortness of breath).   Seen today by telephone visit. She declined video. She has consented for this visit. Doing ok. No real concerns. Not able to check vitals. Tolerating medicines without issue.  She says she is "great". Feels blessed. 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 She goes there at least once a month. Tolerating her medicines. No chest pain. Not short of breath. Not dizzy. Labs from [facility] from September reviewed.   Past Medical History:  Diagnosis Date  . Arthritis   . Carotid stenosis   . Coronary artery disease    s/p remote CABG x1 with LIMA to LAD placed in 1996.   LHC in 2010 showed an occluded LAD to the ostium and patent LIMA to the LAD.  Repeat left heart cath in 08/23/15 in setting of angina and acute diastolic CHF showed single vessel obstructive CAD with ostial  LAD occlusion and  LIMA to LAD was widely patent.  . Hyperlipemia   . Hypertension   . Hypothyroidism   . S/P bunionectomy   . Sleep apnea    uses a cpap  . Trigger thumb of left hand 09/17/2012   Past Surgical History:  Procedure Laterality Date  . CARDIAC CATHETERIZATION     x3  . CARDIAC CATHETERIZATION N/A 08/23/2015   Procedure: Left Heart Cath and Cors/Grafts Angiography;  Surgeon: Peter M Martinique, MD;  Location: [place] [facility];  Service: Cardiovascular;  Laterality: N/A;  . COLONOSCOPY WITH PROPOFOL N/A 10/13/2016   Procedure: COLONOSCOPY  WITH PROPOFOL;  Surgeon: Garlan Fair, MD;  Location: [facility];  Service: Endoscopy;  Laterality: N/A;  . CORONARY ARTERY BYPASS GRAFT  1996   x1  . DILATION AND CURETTAGE OF UTERUS  1985  . EYE SURGERY Left 10/02/2016   ioc for cataracts  . KNEE ARTHROSCOPY WITH PATELLA RECONSTRUCTION Left 1968   left  . NASAL SEPTOPLASTY W/ TURBINOPLASTY  2008   to help tolerated cpap  . TRIGGER FINGER RELEASE Left 09/17/2012   Procedure: RELEASE TRIGGER FINGER/A-1 PULLEY LEFT THUMB (TENDON SHEATH INCISION);  Surgeon: Johnny Bridge, MD;  Location: [place];  Service: Orthopedics;  Laterality: Left;  . TUBAL LIGATION       Current Meds  Medication Sig  . aspirin 81 MG tablet Take 81 mg by mouth at bedtime.   Marland Kitchen atenolol (TENORMIN) 100 MG tablet Take 1 tablet (100 mg total) by mouth every evening.  . cholecalciferol (VITAMIN D) 1000 UNITS tablet Take 2,000 Units by mouth daily.   Marland Kitchen ibuprofen (ADVIL,MOTRIN) 200 MG tablet Take 200 mg by mouth every 6 (six) hours as needed for mild pain or moderate pain.  Marland Kitchen levothyroxine (SYNTHROID, LEVOTHROID) 100 MCG tablet Take 100 mcg by mouth daily before breakfast.  . loratadine (CLARITIN) 10 MG tablet Take 10 mg by mouth daily as needed for allergies.  Marland Kitchen meclizine (ANTIVERT) 25 MG tablet Take 25 mg by mouth every 8 (eight) hours as needed.  . metFORMIN (GLUCOPHAGE) 500 MG tablet Take 500 mg by mouth at bedtime.  . Multiple Vitamins-Minerals (MULTIVITAMIN WITH MINERALS) tablet Take 1 tablet by mouth at bedtime.   . naproxen sodium (ANAPROX) 220 MG tablet Take 440 mg by mouth daily as needed (pain).   . Omega-3 Fatty Acids (FISH OIL) 1000 MG CAPS Take 4 capsules (4,000 mg total) by mouth daily. (Patient taking differently: Take 3,000 mg by mouth daily. )  . rosuvastatin (CRESTOR) 40 MG tablet Take 40 mg by mouth at bedtime.     Allergies:   Sulfamethoxazole-trimethoprim   Social History   Tobacco Use  . Smoking status: Former Smoker     Packs/day: 1.00    Years: 35.00    Pack years: 35.00    Types: Cigarettes    Quit date: 07/30/2014    Years since quitting: 4.8  . Smokeless tobacco: Never Used  Substance Use Topics  . Alcohol use: No    Alcohol/week: 0.0 standard drinks  . Drug use: No     Family Hx: The patient's family history includes Heart attack (age of onset: 61) in her father; Hyperlipidemia in her father; Hypertension in her father; Stroke in her father and mother.  ROS:   Please see the history of present illness.   All other systems reviewed are negative.    Objective:    Vital Signs:  Ht 5\' 3"  (1.6 m)  Wt 249 lb (112.9 kg)   BMI 44.11 kg/m    Wt Readings from Last 3 Encounters:  06/22/19 249 lb (112.9 kg)  03/15/19 251 lb (113.9 kg)  03/22/18 246 lb 6.4 oz (111.8 kg)    Alert female in no acute distress. She sounds good. Not short of breath with conversation.    Labs/Other Tests and Data Reviewed:    Lab Results  Component Value Date   WBC 9.1 08/24/2015   HGB 15.0 08/24/2015   HCT 44.2 08/24/2015   PLT 287 08/24/2015   GLUCOSE 117 (H) 08/24/2015   CHOL 158 01/21/2016   TRIG 371 (H) 01/21/2016   HDL 27 (L) 01/21/2016   LDLCALC 57 01/21/2016   ALT 62 (H) 01/21/2016   AST 61 (H) 01/21/2016   NA 141 08/24/2015   K 4.2 08/24/2015   CL 103 08/24/2015   CREATININE 0.80 08/24/2015   BUN 13 08/24/2015   CO2 22 08/24/2015   TSH 4.52 (H) 08/31/2015   INR 1.08 08/23/2015       BNP (last 3 results) No results for input(s): BNP in the last 8760 hours.  ProBNP (last 3 results) No results for input(s): PROBNP in the last 8760 hours.    Prior CV studies:    The following studies were reviewed today:  CAROTID DOPPLER 12/2018 IMPRESSION: Color duplex indicates minimal heterogeneous and calcified plaque, with no hemodynamically significant stenosis by duplex criteria in the extracranial cerebrovascular circulation.  Note that there is significant blood pressure difference of  the upper extremities, lesser on the right. This may indicate developing right subclavian stenosis and repeat office based assessment is indicated. If further imaging is warranted, consider CT angiogram chest or wrist brachial index and duplex of the upper extremity.  Signed,  Dulcy Fanny. Dellia Nims, RPVI  Vascular and Interventional Radiology Specialists [facility] Electronically Signed   By: Corrie Mckusick D.O.   On: 01/18/2019 16:36   Left Heart Cath and Cors/Grafts Angiography 2017  Conclusion   Ost LAD to Prox LAD lesion, 100% stenosed.  LIMA was injected is normal in caliber, and is anatomically normal.  The left ventricular systolic function is normal.   1. Single vessel obstructive CAD with ostial LAD occlusion 2. Patent LIMA to the LAD 3. Normal LV function.    Echo Study Conclusions 2017  - Left ventricle: The cavity size was normal. Wall thickness was  increased in a pattern of mild LVH. Systolic function was normal.  The estimated ejection fraction was in the range of 60% to 65%.  Features are consistent with a pseudonormal left ventricular  filling pattern, with concomitant abnormal relaxation and  increased filling pressure (grade 2 diastolic dysfunction).   ASSESSMENT & PLAN:    1.  CAD with remote CABG - doing well clinically - no worrisome symptoms.   2. Diastolic dysfunction - not short of breath. BP control unknown. Denies swelling.   3. HTN - unknown status at this time.   4. HLD - on statin therapy - lab with PCP from last September noted.   5. Possible right subclavian stenosis with different BP between arms - left arm felt to be reliable - she has seen Dr. Donnetta Hutching - no further evaluation felt to be needed.   6. No carotid disease per recent study  7. OSA - on CPAP - followed by Dr. Radford Pax.   8. COVID-19 Education: The signs and symptoms of COVID-19 were discussed with the patient and how to seek care  for testing  (follow up with PCP or arrange E-visit).  The importance of social distancing, staying at home, hand hygiene and wearing a mask when out in public were discussed today.  Patient Risk:   After full review of this patient's clinical status, I feel that they are at least moderate risk at this time.  Time:   Today, I have spent 5 minutes with the patient with telehealth technology discussing the above issues.     Medication Adjustments/Labs and Tests Ordered: Current medicines are reviewed at length with the patient today.  Concerns regarding medicines are outlined above.   Tests Ordered: No orders of the defined types were placed in this encounter.   Medication Changes: No orders of the defined types were placed in this encounter.   Disposition:  FU with Dr. Tamala Julian in 6 months.   Patient is agreeable to this plan and will call if any problems develop in the interim.   Amie Critchley, NP  06/22/2019 9:16 AM    [place]

## 2019-06-20 NOTE — Telephone Encounter (Signed)

## 2019-06-22 ENCOUNTER — Telehealth (INDEPENDENT_AMBULATORY_CARE_PROVIDER_SITE_OTHER): Payer: PPO | Admitting: Nurse Practitioner

## 2019-06-22 ENCOUNTER — Other Ambulatory Visit: Payer: Self-pay

## 2019-06-22 ENCOUNTER — Encounter: Payer: Self-pay | Admitting: Nurse Practitioner

## 2019-06-22 VITALS — Ht 63.0 in | Wt 249.0 lb

## 2019-06-22 DIAGNOSIS — I110 Hypertensive heart disease with heart failure: Secondary | ICD-10-CM

## 2019-06-22 DIAGNOSIS — Z9989 Dependence on other enabling machines and devices: Secondary | ICD-10-CM | POA: Diagnosis not present

## 2019-06-22 DIAGNOSIS — G4733 Obstructive sleep apnea (adult) (pediatric): Secondary | ICD-10-CM | POA: Diagnosis not present

## 2019-06-22 DIAGNOSIS — I2510 Atherosclerotic heart disease of native coronary artery without angina pectoris: Secondary | ICD-10-CM

## 2019-06-22 DIAGNOSIS — I5032 Chronic diastolic (congestive) heart failure: Secondary | ICD-10-CM

## 2019-06-22 DIAGNOSIS — Z951 Presence of aortocoronary bypass graft: Secondary | ICD-10-CM | POA: Diagnosis not present

## 2019-06-22 DIAGNOSIS — I779 Disorder of arteries and arterioles, unspecified: Secondary | ICD-10-CM

## 2019-06-22 DIAGNOSIS — E785 Hyperlipidemia, unspecified: Secondary | ICD-10-CM

## 2019-06-22 DIAGNOSIS — I10 Essential (primary) hypertension: Secondary | ICD-10-CM

## 2019-06-22 NOTE — Patient Instructions (Addendum)
After Visit Summary:  We will be checking the following labs today - NONE   Medication Instructions:    Continue with your current medicines.    If you need a refill on your cardiac medications before your next appointment, please call your pharmacy.     Testing/Procedures To Be Arranged:  N/A  Follow-Up:   See Dr. Tamala Julian in 6 months - You will receive a reminder letter in the mail two months in advance. If you don't receive a letter, please call our office to schedule the follow-up appointment.    At [facility], you and your health needs are our priority.  As part of our continuing mission to provide you with exceptional heart care, we have created designated Provider Care Teams.  These Care Teams include your primary Cardiologist (physician) and Advanced Practice Providers (APPs -  Physician Assistants and Nurse Practitioners) who all work together to provide you with the care you need, when you need it.  Special Instructions:  . Stay safe, stay home, wash your hands for at least 20 seconds and wear a mask when out in public.  . It was good to talk with you today.    Call the [place] office at [phone] if you have any questions, problems or concerns.

## 2019-06-23 ENCOUNTER — Ambulatory Visit: Payer: PPO | Attending: Internal Medicine

## 2019-06-23 ENCOUNTER — Ambulatory Visit: Payer: Medicare Other

## 2019-06-23 DIAGNOSIS — Z23 Encounter for immunization: Secondary | ICD-10-CM | POA: Insufficient documentation

## 2019-06-23 NOTE — Progress Notes (Signed)
   Covid-19 Vaccination Clinic  Name:  Pranshi Vanhouten    MRN: [id number] DOB: 1950-06-04  06/23/2019  Ms. Holloway was observed post Covid-19 immunization for 15 minutes without incident. She was provided with Vaccine Information Sheet and instruction to access the V-Safe system.   Ms. Puryear was instructed to call 911 with any severe reactions post vaccine: Marland Kitchen Difficulty breathing  . Swelling of face and throat  . A fast heartbeat  . A bad rash all over body  . Dizziness and weakness   Immunizations Administered    Name Date Dose VIS Date Route   Pfizer COVID-19 Vaccine 06/23/2019 11:01 AM 0.3 mL 04/01/2019 Intramuscular   Manufacturer: [place]   Lot: [id number]   [place]: [place] COVID-19 Vaccine 06/23/2019 11:25 AM 0.3 mL 04/01/2019 Intramuscular   Manufacturer: [place]   Lot: [id number]   [place]: [id number]

## 2019-06-24 DIAGNOSIS — G4710 Hypersomnia, unspecified: Secondary | ICD-10-CM | POA: Diagnosis not present

## 2019-06-24 DIAGNOSIS — G4733 Obstructive sleep apnea (adult) (pediatric): Secondary | ICD-10-CM | POA: Diagnosis not present

## 2019-06-24 DIAGNOSIS — I10 Essential (primary) hypertension: Secondary | ICD-10-CM | POA: Diagnosis not present

## 2019-07-05 DIAGNOSIS — Z85828 Personal history of other malignant neoplasm of skin: Secondary | ICD-10-CM | POA: Diagnosis not present

## 2019-07-05 DIAGNOSIS — L570 Actinic keratosis: Secondary | ICD-10-CM | POA: Diagnosis not present

## 2019-07-05 DIAGNOSIS — L821 Other seborrheic keratosis: Secondary | ICD-10-CM | POA: Diagnosis not present

## 2019-07-05 DIAGNOSIS — L720 Epidermal cyst: Secondary | ICD-10-CM | POA: Diagnosis not present

## 2019-07-07 ENCOUNTER — Other Ambulatory Visit: Payer: Self-pay | Admitting: Internal Medicine

## 2019-07-07 DIAGNOSIS — Z1231 Encounter for screening mammogram for malignant neoplasm of breast: Secondary | ICD-10-CM

## 2019-07-08 DIAGNOSIS — M170 Bilateral primary osteoarthritis of knee: Secondary | ICD-10-CM | POA: Diagnosis not present

## 2019-07-08 DIAGNOSIS — H6983 Other specified disorders of Eustachian tube, bilateral: Secondary | ICD-10-CM | POA: Diagnosis not present

## 2019-07-14 ENCOUNTER — Ambulatory Visit: Payer: PPO | Attending: Internal Medicine

## 2019-07-14 DIAGNOSIS — Z23 Encounter for immunization: Secondary | ICD-10-CM

## 2019-07-14 NOTE — Progress Notes (Signed)
   Covid-19 Vaccination Clinic  Name:  Kathryn Castillo    MRN: [id number] DOB: 08-20-1950  07/14/2019  Kathryn Castillo was observed post Covid-19 immunization for 15 minutes without incident. She was provided with Vaccine Information Sheet and instruction to access the V-Safe system.   Kathryn Castillo was instructed to call 911 with any severe reactions post vaccine: Marland Kitchen Difficulty breathing  . Swelling of face and throat  . A fast heartbeat  . A bad rash all over body  . Dizziness and weakness   Immunizations Administered    Name Date Dose VIS Date Route   Pfizer COVID-19 Vaccine 07/14/2019 10:48 AM 0.3 mL 04/01/2019 Intramuscular   Manufacturer: [place]   Lot: [id number]   [place]: [id number]

## 2019-08-02 DIAGNOSIS — Z0000 Encounter for general adult medical examination without abnormal findings: Secondary | ICD-10-CM | POA: Diagnosis not present

## 2019-08-02 DIAGNOSIS — G4733 Obstructive sleep apnea (adult) (pediatric): Secondary | ICD-10-CM | POA: Diagnosis not present

## 2019-08-02 DIAGNOSIS — I2510 Atherosclerotic heart disease of native coronary artery without angina pectoris: Secondary | ICD-10-CM | POA: Diagnosis not present

## 2019-08-02 DIAGNOSIS — Z1389 Encounter for screening for other disorder: Secondary | ICD-10-CM | POA: Diagnosis not present

## 2019-08-02 DIAGNOSIS — I771 Stricture of artery: Secondary | ICD-10-CM | POA: Diagnosis not present

## 2019-08-02 DIAGNOSIS — E7800 Pure hypercholesterolemia, unspecified: Secondary | ICD-10-CM | POA: Diagnosis not present

## 2019-08-02 DIAGNOSIS — E039 Hypothyroidism, unspecified: Secondary | ICD-10-CM | POA: Diagnosis not present

## 2019-08-02 DIAGNOSIS — R7301 Impaired fasting glucose: Secondary | ICD-10-CM | POA: Diagnosis not present

## 2019-08-17 DIAGNOSIS — M170 Bilateral primary osteoarthritis of knee: Secondary | ICD-10-CM | POA: Diagnosis not present

## 2019-08-19 DIAGNOSIS — E7800 Pure hypercholesterolemia, unspecified: Secondary | ICD-10-CM | POA: Diagnosis not present

## 2019-08-19 DIAGNOSIS — I10 Essential (primary) hypertension: Secondary | ICD-10-CM | POA: Diagnosis not present

## 2019-08-19 DIAGNOSIS — E782 Mixed hyperlipidemia: Secondary | ICD-10-CM | POA: Diagnosis not present

## 2019-08-19 DIAGNOSIS — E039 Hypothyroidism, unspecified: Secondary | ICD-10-CM | POA: Diagnosis not present

## 2019-08-19 DIAGNOSIS — E1169 Type 2 diabetes mellitus with other specified complication: Secondary | ICD-10-CM | POA: Diagnosis not present

## 2019-08-19 DIAGNOSIS — I2510 Atherosclerotic heart disease of native coronary artery without angina pectoris: Secondary | ICD-10-CM | POA: Diagnosis not present

## 2019-08-19 DIAGNOSIS — E781 Pure hyperglyceridemia: Secondary | ICD-10-CM | POA: Diagnosis not present

## 2019-08-24 DIAGNOSIS — M170 Bilateral primary osteoarthritis of knee: Secondary | ICD-10-CM | POA: Diagnosis not present

## 2019-08-25 ENCOUNTER — Ambulatory Visit
Admission: RE | Admit: 2019-08-25 | Discharge: 2019-08-25 | Disposition: A | Discharge status: Home / Self Care | Payer: PPO | Source: Ambulatory Visit | Attending: Internal Medicine | Admitting: Internal Medicine

## 2019-08-25 ENCOUNTER — Other Ambulatory Visit: Payer: Self-pay

## 2019-08-25 DIAGNOSIS — Z1231 Encounter for screening mammogram for malignant neoplasm of breast: Secondary | ICD-10-CM

## 2019-08-31 DIAGNOSIS — M170 Bilateral primary osteoarthritis of knee: Secondary | ICD-10-CM | POA: Diagnosis not present

## 2019-10-26 DIAGNOSIS — Z961 Presence of intraocular lens: Secondary | ICD-10-CM | POA: Diagnosis not present

## 2019-10-26 DIAGNOSIS — H538 Other visual disturbances: Secondary | ICD-10-CM | POA: Diagnosis not present

## 2019-11-17 DIAGNOSIS — I10 Essential (primary) hypertension: Secondary | ICD-10-CM | POA: Diagnosis not present

## 2019-11-17 DIAGNOSIS — E7800 Pure hypercholesterolemia, unspecified: Secondary | ICD-10-CM | POA: Diagnosis not present

## 2019-11-17 DIAGNOSIS — E1169 Type 2 diabetes mellitus with other specified complication: Secondary | ICD-10-CM | POA: Diagnosis not present

## 2019-11-17 DIAGNOSIS — E039 Hypothyroidism, unspecified: Secondary | ICD-10-CM | POA: Diagnosis not present

## 2019-11-17 DIAGNOSIS — I2510 Atherosclerotic heart disease of native coronary artery without angina pectoris: Secondary | ICD-10-CM | POA: Diagnosis not present

## 2019-11-17 DIAGNOSIS — E781 Pure hyperglyceridemia: Secondary | ICD-10-CM | POA: Diagnosis not present

## 2019-11-17 DIAGNOSIS — E782 Mixed hyperlipidemia: Secondary | ICD-10-CM | POA: Diagnosis not present

## 2020-01-05 DIAGNOSIS — H6983 Other specified disorders of Eustachian tube, bilateral: Secondary | ICD-10-CM | POA: Diagnosis not present

## 2020-01-05 DIAGNOSIS — H9222 Otorrhagia, left ear: Secondary | ICD-10-CM | POA: Diagnosis not present

## 2020-01-17 ENCOUNTER — Other Ambulatory Visit: Payer: Self-pay | Admitting: Internal Medicine

## 2020-01-17 DIAGNOSIS — N644 Mastodynia: Secondary | ICD-10-CM

## 2020-01-17 DIAGNOSIS — I2510 Atherosclerotic heart disease of native coronary artery without angina pectoris: Secondary | ICD-10-CM | POA: Diagnosis not present

## 2020-01-17 DIAGNOSIS — N6452 Nipple discharge: Secondary | ICD-10-CM | POA: Diagnosis not present

## 2020-01-17 DIAGNOSIS — E039 Hypothyroidism, unspecified: Secondary | ICD-10-CM | POA: Diagnosis not present

## 2020-01-17 DIAGNOSIS — E1169 Type 2 diabetes mellitus with other specified complication: Secondary | ICD-10-CM | POA: Diagnosis not present

## 2020-01-17 DIAGNOSIS — Z7984 Long term (current) use of oral hypoglycemic drugs: Secondary | ICD-10-CM | POA: Diagnosis not present

## 2020-01-17 DIAGNOSIS — E7800 Pure hypercholesterolemia, unspecified: Secondary | ICD-10-CM | POA: Diagnosis not present

## 2020-01-17 DIAGNOSIS — I10 Essential (primary) hypertension: Secondary | ICD-10-CM | POA: Diagnosis not present

## 2020-01-31 ENCOUNTER — Other Ambulatory Visit: Payer: Self-pay

## 2020-01-31 ENCOUNTER — Ambulatory Visit
Admission: RE | Admit: 2020-01-31 | Discharge: 2020-01-31 | Disposition: A | Discharge status: Home / Self Care | Payer: PPO | Source: Ambulatory Visit | Attending: Internal Medicine | Admitting: Internal Medicine

## 2020-01-31 DIAGNOSIS — N6452 Nipple discharge: Secondary | ICD-10-CM | POA: Diagnosis not present

## 2020-01-31 DIAGNOSIS — M1711 Unilateral primary osteoarthritis, right knee: Secondary | ICD-10-CM | POA: Diagnosis not present

## 2020-01-31 DIAGNOSIS — N644 Mastodynia: Secondary | ICD-10-CM

## 2020-01-31 DIAGNOSIS — M7581 Other shoulder lesions, right shoulder: Secondary | ICD-10-CM | POA: Diagnosis not present

## 2020-02-07 DIAGNOSIS — H6983 Other specified disorders of Eustachian tube, bilateral: Secondary | ICD-10-CM | POA: Diagnosis not present

## 2020-02-23 DIAGNOSIS — H6983 Other specified disorders of Eustachian tube, bilateral: Secondary | ICD-10-CM | POA: Diagnosis not present

## 2020-04-19 DIAGNOSIS — E782 Mixed hyperlipidemia: Secondary | ICD-10-CM | POA: Diagnosis not present

## 2020-04-19 DIAGNOSIS — I2510 Atherosclerotic heart disease of native coronary artery without angina pectoris: Secondary | ICD-10-CM | POA: Diagnosis not present

## 2020-04-19 DIAGNOSIS — E1169 Type 2 diabetes mellitus with other specified complication: Secondary | ICD-10-CM | POA: Diagnosis not present

## 2020-04-19 DIAGNOSIS — E7800 Pure hypercholesterolemia, unspecified: Secondary | ICD-10-CM | POA: Diagnosis not present

## 2020-04-19 DIAGNOSIS — I10 Essential (primary) hypertension: Secondary | ICD-10-CM | POA: Diagnosis not present

## 2020-04-19 DIAGNOSIS — E039 Hypothyroidism, unspecified: Secondary | ICD-10-CM | POA: Diagnosis not present

## 2020-04-19 DIAGNOSIS — E781 Pure hyperglyceridemia: Secondary | ICD-10-CM | POA: Diagnosis not present

## 2020-05-14 DIAGNOSIS — M1711 Unilateral primary osteoarthritis, right knee: Secondary | ICD-10-CM | POA: Diagnosis not present

## 2020-05-14 DIAGNOSIS — M7581 Other shoulder lesions, right shoulder: Secondary | ICD-10-CM | POA: Diagnosis not present

## 2020-05-28 ENCOUNTER — Ambulatory Visit: Payer: PPO | Admitting: Interventional Cardiology

## 2020-05-28 ENCOUNTER — Other Ambulatory Visit: Payer: Self-pay

## 2020-05-28 ENCOUNTER — Telehealth: Payer: Self-pay | Admitting: Interventional Cardiology

## 2020-05-28 ENCOUNTER — Encounter: Payer: Self-pay | Admitting: Interventional Cardiology

## 2020-05-28 VITALS — BP 128/62 | HR 61 | Ht 60.0 in | Wt 224.0 lb

## 2020-05-28 DIAGNOSIS — M546 Pain in thoracic spine: Secondary | ICD-10-CM

## 2020-05-28 DIAGNOSIS — R079 Chest pain, unspecified: Secondary | ICD-10-CM

## 2020-05-28 DIAGNOSIS — E785 Hyperlipidemia, unspecified: Secondary | ICD-10-CM

## 2020-05-28 DIAGNOSIS — I2510 Atherosclerotic heart disease of native coronary artery without angina pectoris: Secondary | ICD-10-CM

## 2020-05-28 DIAGNOSIS — I5032 Chronic diastolic (congestive) heart failure: Secondary | ICD-10-CM | POA: Diagnosis not present

## 2020-05-28 DIAGNOSIS — I10 Essential (primary) hypertension: Secondary | ICD-10-CM | POA: Diagnosis not present

## 2020-05-28 DIAGNOSIS — G4733 Obstructive sleep apnea (adult) (pediatric): Secondary | ICD-10-CM

## 2020-05-28 DIAGNOSIS — Z7189 Other specified counseling: Secondary | ICD-10-CM | POA: Diagnosis not present

## 2020-05-28 DIAGNOSIS — R0602 Shortness of breath: Secondary | ICD-10-CM | POA: Diagnosis not present

## 2020-05-28 LAB — TROPONIN T: Troponin T (Highly Sensitive): 7 ng/L (ref 0–14)

## 2020-05-28 NOTE — Addendum Note (Signed)
Addended by: Eulis Foster on: 05/28/2020 01:48 PM   Modules accepted: Orders

## 2020-05-28 NOTE — Progress Notes (Signed)
Cardiology Office Note:    Date:  05/28/2020   ID:  Kathryn Castillo, DOB 07-Mar-1951, MRN [id number]  PCP:  Seward Carol, MD  Cardiologist:  No primary care provider on file.   Referring MD: Seward Carol, MD   Chief Complaint  Patient presents with  . Chest Pain  . Shortness of Breath    History of Present Illness:    Kathryn Castillo is a 70 y.o. female with a hx of obstructive sleep apnea ([place]), coronary artery disease, bilateral carotid disease right greater than left 2016, ostial LAD, restenosis of ostial LAD after PCI in 1996, and subsequent LIMA to LAD 1996.  Right subclavian stenosis by Doppler (Dr. Sherren Mocha Early).  Other problems include diastolic dysfunction, primary hypertension, hyperlipidemia, and obstructive sleep apnea.  Starting in the morning 24 hours ago as she was preparing for church rolling her hair, she developed diaphoresis and nausea.  She sat in a chair after deciding not to go to church.  She then began having left subscapula discomfort that has persisted.  She has some shortness of breath getting into the office from her car.  In speaking with her she seems well compensated with good skin color and no increase in breathing.  No difficulty sleeping last night.  There is no pleuritic component.  Past Medical History:  Diagnosis Date  . Arthritis   . Carotid stenosis   . Coronary artery disease    s/p remote CABG x1 with LIMA to LAD placed in 1996.   LHC in 2010 showed an occluded LAD to the ostium and patent LIMA to the LAD.  Repeat left heart cath in 08/23/15 in setting of angina and acute diastolic CHF showed single vessel obstructive CAD with ostial LAD occlusion and  LIMA to LAD was widely patent.  . Hyperlipemia   . Hypertension   . Hypothyroidism   . S/P bunionectomy   . Sleep apnea    uses a cpap  . Trigger thumb of left hand 09/17/2012    Past Surgical History:  Procedure Laterality Date  . CARDIAC CATHETERIZATION     x3  . CARDIAC  CATHETERIZATION N/A 08/23/2015   Procedure: Left Heart Cath and Cors/Grafts Angiography;  Surgeon: Peter M Martinique, MD;  Location: [facility];  Service: Cardiovascular;  Laterality: N/A;  . COLONOSCOPY WITH PROPOFOL N/A 10/13/2016   Procedure: COLONOSCOPY WITH PROPOFOL;  Surgeon: Garlan Fair, MD;  Location: [facility];  Service: Endoscopy;  Laterality: N/A;  . CORONARY ARTERY BYPASS GRAFT  1996   x1  . DILATION AND CURETTAGE OF UTERUS  1985  . EYE SURGERY Left 10/02/2016   ioc for cataracts  . KNEE ARTHROSCOPY WITH PATELLA RECONSTRUCTION Left 1968   left  . NASAL SEPTOPLASTY W/ TURBINOPLASTY  2008   to help tolerated cpap  . TRIGGER FINGER RELEASE Left 09/17/2012   Procedure: RELEASE TRIGGER FINGER/A-1 PULLEY LEFT THUMB (TENDON SHEATH INCISION);  Surgeon: Johnny Bridge, MD;  Location: [facility];  Service: Orthopedics;  Laterality: Left;  . TUBAL LIGATION      Current Medications: Current Meds  Medication Sig  . aspirin 81 MG tablet Take 81 mg by mouth at bedtime.   Marland Kitchen atenolol (TENORMIN) 100 MG tablet Take 1 tablet (100 mg total) by mouth every evening.  . cholecalciferol (VITAMIN D) 1000 UNITS tablet Take 2,000 Units by mouth daily.   Marland Kitchen ibuprofen (ADVIL,MOTRIN) 200 MG tablet Take 200 mg by mouth every 6 (six) hours as needed  for mild pain or moderate pain.  Marland Kitchen levothyroxine (SYNTHROID, LEVOTHROID) 100 MCG tablet Take 100 mcg by mouth daily before breakfast.  . loratadine (CLARITIN) 10 MG tablet Take 10 mg by mouth daily as needed for allergies.  Marland Kitchen meclizine (ANTIVERT) 25 MG tablet Take 25 mg by mouth every 8 (eight) hours as needed.  . metFORMIN (GLUCOPHAGE) 500 MG tablet Take 500 mg by mouth at bedtime.  . Multiple Vitamins-Minerals (MULTIVITAMIN WITH MINERALS) tablet Take 1 tablet by mouth at bedtime.   . naproxen sodium (ANAPROX) 220 MG tablet Take 440 mg by mouth daily as needed (pain).   . Omega-3 Fatty Acids (FISH OIL) 1000 MG CAPS Take 4 capsules  (4,000 mg total) by mouth daily.  . rosuvastatin (CRESTOR) 40 MG tablet Take 40 mg by mouth at bedtime.     Allergies:   Sulfamethoxazole-trimethoprim, Isosorbide nitrate, and Penicillin g   Social History   Socioeconomic History  . Marital status: Divorced    Spouse name: Not on file  . Number of children: 1  . Years of education: Not on file  . Highest education level: Not on file  Occupational History  . Not on file  Tobacco Use  . Smoking status: Former Smoker    Packs/day: 1.00    Years: 35.00    Pack years: 35.00    Types: Cigarettes    Quit date: 07/30/2014    Years since quitting: 5.8  . Smokeless tobacco: Never Used  Vaping Use  . Vaping Use: Never used  Substance and Sexual Activity  . Alcohol use: No    Alcohol/week: 0.0 standard drinks  . Drug use: No  . Sexual activity: Not on file  Other Topics Concern  . Not on file  Social History Narrative   Lives alone   Social Determinants of Health   Financial Resource Strain: Not on file  Food Insecurity: Not on file  Transportation Needs: Not on file  Physical Activity: Not on file  Stress: Not on file  Social Connections: Not on file     Family History: The patient's family history includes Heart attack (age of onset: 27) in her father; Hyperlipidemia in her father; Hypertension in her father; Stroke in her father and mother.  ROS:   Please see the history of present illness.    Chest discomfort is dissimilar to prior cardiac discomfort.  No pleuritic component.  Has been continuous since yesterday.  Not bad enough to take any pain medication.  All other systems reviewed and are negative.  EKGs/Labs/Other Studies Reviewed:    The following studies were reviewed today: Coronary angiography 2017:  1. Single vessel obstructive CAD with ostial LAD occlusion 2. Patent LIMA to the LAD 3. Normal LV function.   LDL cholesterol 53; hemoglobin A1c 5.5 2021.   EKG:  EKG sinus rhythm, incomplete right  bundle, and in comparison to 17th 2019, no changes occurred.  Recent Labs: No results found for requested labs within last 8760 hours.  Recent Lipid Panel    Component Value Date/Time   CHOL 158 01/21/2016 0755   TRIG 371 (H) 01/21/2016 0755   HDL 27 (L) 01/21/2016 0755   CHOLHDL 5.9 (H) 01/21/2016 0755   VLDL 74 (H) 01/21/2016 0755   LDLCALC 57 01/21/2016 0755    Physical Exam:    VS:  BP 128/62   Pulse 61   Ht 5' (1.524 m)   Wt 224 lb (101.6 kg)   SpO2 98%   BMI 43.75 kg/m  Wt Readings from Last 3 Encounters:  05/28/20 224 lb (101.6 kg)  06/22/19 249 lb (112.9 kg)  03/15/19 251 lb (113.9 kg)     GEN: Morbidly obese. No acute distress HEENT: Normal NECK: No JVD. LYMPHATICS: No lymphadenopathy CARDIAC: No  murmur. RRR no gallop, or edema. VASCULAR:  Normal Pulses. No bruits. RESPIRATORY:  Clear to auscultation without rales, wheezing or rhonchi  ABDOMEN: Soft, non-tender, non-distended, No pulsatile mass, MUSCULOSKELETAL: No deformity  SKIN: Warm and dry NEUROLOGIC:  Alert and oriented x 3 PSYCHIATRIC:  Normal affect   ASSESSMENT:    1. Chest pain, unspecified type   2. Shortness of breath   3. Acute left-sided thoracic back pain   4. Atherosclerosis of native coronary artery of native heart without angina pectoris   5. Essential hypertension   6. Obstructive sleep apnea   7. Chronic diastolic heart failure ([place])   8. Hyperlipidemia, unspecified hyperlipidemia type   9. Educated about COVID-19 virus infection    PLAN:    In order of problems listed above:  1. Left subclavicular and subclavicular discomfort, unchanged since it developed greater than 24 hours ago.  Not made worse by position or breathing.  Associated with shortness of breath and therefore pulmonary embolus/pulmonary infarction is a consideration.  CT angiogram of the lungs will be performed.  CT may give [place] information concerning parenchymal lung disease or thoracic spine  abnormality. 2. Rule out PE 3. See #1 and #2 above 4. Troponin I is being done to exclude the possibility of coronary ischemia although with nature of the discomfort and stability of EKG, this seems unlikely. 5. Blood pressure is well controlled today. 6. CPAP compliance recommended and being adhered to 7. No volume overload 8. Continue statin therapy 9. Vaccinated and practicing medication.   Medication Adjustments/Labs and Tests Ordered: Current medicines are reviewed at length with the patient today.  Concerns regarding medicines are outlined above.  Orders Placed This Encounter  Procedures  . CT ANGIO CHEST PE W OR WO CONTRAST  . Basic metabolic panel  . Troponin T  . EKG 12-Lead   No orders of the defined types were placed in this encounter.   Patient Instructions  Medication Instructions:  Your physician recommends that you continue on your current medications as directed. Please refer to the Current Medication list given to you today.  *If you need a refill on your cardiac medications before your next appointment, please call your pharmacy*   Lab Work: Today: Troponin T, BMET If you have labs (blood work) drawn today and your tests are completely normal, you will receive your results only by: Marland Kitchen MyChart Message (if you have MyChart) OR . A paper copy in the mail If you have any lab test that is abnormal or we need to change your treatment, we will call you to review the results.   Testing/Procedures: Your physician recommends that you have a Chest CT to rule out PE.     Follow-Up: At [facility], you and your health needs are our priority.  As part of our continuing mission to provide you with exceptional heart care, we have created designated Provider Care Teams.  These Care Teams include your primary Cardiologist (physician) and Advanced Practice Providers (APPs -  Physician Assistants and Nurse Practitioners) who all work together to provide you with the care  you need, when you need it.  We recommend signing up for the patient portal called "MyChart".  Sign up information is provided on this  After Visit Summary.  MyChart is used to connect with patients for Virtual Visits (Telemedicine).  Patients are able to view lab/test results, encounter notes, upcoming appointments, etc.  Non-urgent messages can be sent to your provider as well.   To learn more about what you can do with MyChart, go to [email or web address].    Your next appointment:   6 month(s)  The format for your next appointment:   In Person  Provider:   You may see No primary care provider on file. or one of the following Advanced Practice Providers on your designated Care Team:    Kathyrn Drown, NP       Signed, Sinclair Grooms, MD  05/28/2020 1:42 PM    [facility]

## 2020-05-28 NOTE — Telephone Encounter (Signed)
Pt c/o of Chest Pain: STAT if CP now or developed within 24 hours  1. Are you having CP right now? Not sure  2. Are you experiencing any other symptoms (ex. SOB, nausea, vomiting, sweating)? Was hot and sweaty and nauseated yesterday  3. How long have you been experiencing CP? About 3:00 pm yesterday patient mentioned symptoms, not sure when exactly it started  4. Is your CP continuous or coming and going? Not sure. Thinks yesterday it was continuous  5. Have you taken Nitroglycerin? No  Patient's daughter states yesterday around 3:00 pm the patient mentioned she had a burning pain in her back and some in her chest. She states she is not sure when exactly it started. She states she is not currently with the patient, but will call her this morning. She states yesterday she was also hot, sweaty, and nauseas. She states she is not sure if she is still having symptoms since she is not currently with the patient.

## 2020-05-28 NOTE — Telephone Encounter (Signed)
Called and spoke with pt who reports all day yesterday she had nausea, chest pain with radiation to her back, and was diaphoretic.  She mainly just laid around and rested yesterday but nothing made the pain worse or better.  Today she has not really been up much, still having a little chest burning and reports becoming dizzy when up.  She has not used any SL ntg.  Has not tried anything other than rest and her normal medications.  Scheduled for evaluation with Dr Tamala Julian today.  Advised of ER precautions.  Pt states understanding.

## 2020-05-28 NOTE — Patient Instructions (Signed)
Medication Instructions:  Your physician recommends that you continue on your current medications as directed. Please refer to the Current Medication list given to you today.  *If you need a refill on your cardiac medications before your next appointment, please call your pharmacy*   Lab Work: Today: Troponin T, BMET If you have labs (blood work) drawn today and your tests are completely normal, you will receive your results only by: Marland Kitchen MyChart Message (if you have MyChart) OR . A paper copy in the mail If you have any lab test that is abnormal or we need to change your treatment, we will call you to review the results.   Testing/Procedures: Your physician recommends that you have a Chest CT to rule out PE.     Follow-Up: At [facility], you and your health needs are our priority.  As part of our continuing mission to provide you with exceptional heart care, we have created designated Provider Care Teams.  These Care Teams include your primary Cardiologist (physician) and Advanced Practice Providers (APPs -  Physician Assistants and Nurse Practitioners) who all work together to provide you with the care you need, when you need it.  We recommend signing up for the patient portal called "MyChart".  Sign up information is provided on this After Visit Summary.  MyChart is used to connect with patients for Virtual Visits (Telemedicine).  Patients are able to view lab/test results, encounter notes, upcoming appointments, etc.  Non-urgent messages can be sent to your provider as well.   To learn more about what you can do with MyChart, go to [email or web address].    Your next appointment:   6 month(s)  The format for your next appointment:   In Person  Provider:   You may see No primary care provider on file. or one of the following Advanced Practice Providers on your designated Care Team:    Kathyrn Drown, NP

## 2020-05-29 LAB — BASIC METABOLIC PANEL
BUN/Creatinine Ratio: 20 (ref 12–28)
BUN: 14 mg/dL (ref 8–27)
CO2: 18 mmol/L — ABNORMAL LOW (ref 20–29)
Calcium: 9.3 mg/dL (ref 8.7–10.3)
Chloride: 99 mmol/L (ref 96–106)
Creatinine, Ser: 0.69 mg/dL (ref 0.57–1.00)
GFR calc Af Amer: 102 mL/min/{1.73_m2} (ref >59)
GFR calc non Af Amer: 88 mL/min/{1.73_m2} (ref >59)
Glucose: 122 mg/dL — ABNORMAL HIGH (ref 65–99)
Potassium: 4.5 mmol/L (ref 3.5–5.2)
Sodium: 136 mmol/L (ref 134–144)

## 2020-05-31 ENCOUNTER — Ambulatory Visit (INDEPENDENT_AMBULATORY_CARE_PROVIDER_SITE_OTHER)
Admission: RE | Admit: 2020-05-31 | Discharge: 2020-05-31 | Disposition: A | Discharge status: Home / Self Care | Payer: PPO | Source: Ambulatory Visit | Attending: Interventional Cardiology | Admitting: Interventional Cardiology

## 2020-05-31 ENCOUNTER — Other Ambulatory Visit: Payer: Self-pay

## 2020-05-31 DIAGNOSIS — R079 Chest pain, unspecified: Secondary | ICD-10-CM | POA: Diagnosis not present

## 2020-05-31 DIAGNOSIS — R911 Solitary pulmonary nodule: Secondary | ICD-10-CM | POA: Diagnosis not present

## 2020-05-31 DIAGNOSIS — I517 Cardiomegaly: Secondary | ICD-10-CM | POA: Diagnosis not present

## 2020-05-31 DIAGNOSIS — J189 Pneumonia, unspecified organism: Secondary | ICD-10-CM | POA: Diagnosis not present

## 2020-05-31 DIAGNOSIS — R0600 Dyspnea, unspecified: Secondary | ICD-10-CM | POA: Diagnosis not present

## 2020-05-31 MED ORDER — IOHEXOL 350 MG/ML SOLN
80.0000 mL | Freq: Once | INTRAVENOUS | Status: AC | PRN
  Administered 2020-05-31: 80 mL via INTRAVENOUS

## 2020-06-26 DIAGNOSIS — H90A32 Mixed conductive and sensorineural hearing loss, unilateral, left ear with restricted hearing on the contralateral side: Secondary | ICD-10-CM | POA: Diagnosis not present

## 2020-06-26 DIAGNOSIS — H90A21 Sensorineural hearing loss, unilateral, right ear, with restricted hearing on the contralateral side: Secondary | ICD-10-CM | POA: Diagnosis not present

## 2020-06-26 DIAGNOSIS — H6983 Other specified disorders of Eustachian tube, bilateral: Secondary | ICD-10-CM | POA: Diagnosis not present

## 2020-07-05 DIAGNOSIS — I2510 Atherosclerotic heart disease of native coronary artery without angina pectoris: Secondary | ICD-10-CM | POA: Diagnosis not present

## 2020-07-05 DIAGNOSIS — I10 Essential (primary) hypertension: Secondary | ICD-10-CM | POA: Diagnosis not present

## 2020-07-05 DIAGNOSIS — E7800 Pure hypercholesterolemia, unspecified: Secondary | ICD-10-CM | POA: Diagnosis not present

## 2020-07-05 DIAGNOSIS — E1169 Type 2 diabetes mellitus with other specified complication: Secondary | ICD-10-CM | POA: Diagnosis not present

## 2020-07-05 DIAGNOSIS — E781 Pure hyperglyceridemia: Secondary | ICD-10-CM | POA: Diagnosis not present

## 2020-07-05 DIAGNOSIS — E782 Mixed hyperlipidemia: Secondary | ICD-10-CM | POA: Diagnosis not present

## 2020-07-05 DIAGNOSIS — E039 Hypothyroidism, unspecified: Secondary | ICD-10-CM | POA: Diagnosis not present

## 2020-07-27 DIAGNOSIS — G4733 Obstructive sleep apnea (adult) (pediatric): Secondary | ICD-10-CM | POA: Diagnosis not present

## 2020-07-27 DIAGNOSIS — I10 Essential (primary) hypertension: Secondary | ICD-10-CM | POA: Diagnosis not present

## 2020-07-27 DIAGNOSIS — G4710 Hypersomnia, unspecified: Secondary | ICD-10-CM | POA: Diagnosis not present

## 2020-08-13 DIAGNOSIS — S46011A Strain of muscle(s) and tendon(s) of the rotator cuff of right shoulder, initial encounter: Secondary | ICD-10-CM | POA: Diagnosis not present

## 2020-08-13 DIAGNOSIS — M542 Cervicalgia: Secondary | ICD-10-CM | POA: Diagnosis not present

## 2020-08-21 DIAGNOSIS — M25511 Pain in right shoulder: Secondary | ICD-10-CM | POA: Diagnosis not present

## 2020-08-24 DIAGNOSIS — M7581 Other shoulder lesions, right shoulder: Secondary | ICD-10-CM | POA: Diagnosis not present

## 2020-08-27 DIAGNOSIS — I6529 Occlusion and stenosis of unspecified carotid artery: Secondary | ICD-10-CM | POA: Diagnosis not present

## 2020-08-27 DIAGNOSIS — Z0000 Encounter for general adult medical examination without abnormal findings: Secondary | ICD-10-CM | POA: Diagnosis not present

## 2020-08-27 DIAGNOSIS — E039 Hypothyroidism, unspecified: Secondary | ICD-10-CM | POA: Diagnosis not present

## 2020-08-27 DIAGNOSIS — M8588 Other specified disorders of bone density and structure, other site: Secondary | ICD-10-CM | POA: Diagnosis not present

## 2020-08-27 DIAGNOSIS — R7309 Other abnormal glucose: Secondary | ICD-10-CM | POA: Diagnosis not present

## 2020-08-27 DIAGNOSIS — I10 Essential (primary) hypertension: Secondary | ICD-10-CM | POA: Diagnosis not present

## 2020-08-27 DIAGNOSIS — Z7984 Long term (current) use of oral hypoglycemic drugs: Secondary | ICD-10-CM | POA: Diagnosis not present

## 2020-08-27 DIAGNOSIS — E7800 Pure hypercholesterolemia, unspecified: Secondary | ICD-10-CM | POA: Diagnosis not present

## 2020-08-27 DIAGNOSIS — I779 Disorder of arteries and arterioles, unspecified: Secondary | ICD-10-CM | POA: Diagnosis not present

## 2020-08-27 DIAGNOSIS — I2510 Atherosclerotic heart disease of native coronary artery without angina pectoris: Secondary | ICD-10-CM | POA: Diagnosis not present

## 2020-08-27 DIAGNOSIS — E1169 Type 2 diabetes mellitus with other specified complication: Secondary | ICD-10-CM | POA: Diagnosis not present

## 2020-08-27 DIAGNOSIS — I771 Stricture of artery: Secondary | ICD-10-CM | POA: Diagnosis not present

## 2020-09-25 DIAGNOSIS — M25562 Pain in left knee: Secondary | ICD-10-CM | POA: Diagnosis not present

## 2020-09-25 DIAGNOSIS — M1711 Unilateral primary osteoarthritis, right knee: Secondary | ICD-10-CM | POA: Diagnosis not present

## 2020-09-25 DIAGNOSIS — M7581 Other shoulder lesions, right shoulder: Secondary | ICD-10-CM | POA: Diagnosis not present

## 2020-10-03 DIAGNOSIS — I2510 Atherosclerotic heart disease of native coronary artery without angina pectoris: Secondary | ICD-10-CM | POA: Diagnosis not present

## 2020-10-03 DIAGNOSIS — E781 Pure hyperglyceridemia: Secondary | ICD-10-CM | POA: Diagnosis not present

## 2020-10-03 DIAGNOSIS — E1169 Type 2 diabetes mellitus with other specified complication: Secondary | ICD-10-CM | POA: Diagnosis not present

## 2020-10-03 DIAGNOSIS — E782 Mixed hyperlipidemia: Secondary | ICD-10-CM | POA: Diagnosis not present

## 2020-10-03 DIAGNOSIS — I10 Essential (primary) hypertension: Secondary | ICD-10-CM | POA: Diagnosis not present

## 2020-10-03 DIAGNOSIS — E039 Hypothyroidism, unspecified: Secondary | ICD-10-CM | POA: Diagnosis not present

## 2020-10-03 DIAGNOSIS — E7800 Pure hypercholesterolemia, unspecified: Secondary | ICD-10-CM | POA: Diagnosis not present

## 2020-10-19 DIAGNOSIS — L237 Allergic contact dermatitis due to plants, except food: Secondary | ICD-10-CM | POA: Diagnosis not present

## 2020-10-25 ENCOUNTER — Other Ambulatory Visit: Payer: Self-pay | Admitting: Internal Medicine

## 2020-10-25 DIAGNOSIS — Z1231 Encounter for screening mammogram for malignant neoplasm of breast: Secondary | ICD-10-CM

## 2020-11-13 DIAGNOSIS — R51 Headache with orthostatic component, not elsewhere classified: Secondary | ICD-10-CM | POA: Diagnosis not present

## 2020-11-13 DIAGNOSIS — Z961 Presence of intraocular lens: Secondary | ICD-10-CM | POA: Diagnosis not present

## 2020-11-15 DIAGNOSIS — G4710 Hypersomnia, unspecified: Secondary | ICD-10-CM | POA: Diagnosis not present

## 2020-11-15 DIAGNOSIS — I10 Essential (primary) hypertension: Secondary | ICD-10-CM | POA: Diagnosis not present

## 2020-11-15 DIAGNOSIS — G4733 Obstructive sleep apnea (adult) (pediatric): Secondary | ICD-10-CM | POA: Diagnosis not present

## 2020-12-19 ENCOUNTER — Ambulatory Visit
Admission: RE | Admit: 2020-12-19 | Discharge: 2020-12-19 | Disposition: A | Discharge status: Home / Self Care | Payer: PPO | Source: Ambulatory Visit | Attending: Internal Medicine | Admitting: Internal Medicine

## 2020-12-19 ENCOUNTER — Other Ambulatory Visit: Payer: Self-pay

## 2020-12-19 DIAGNOSIS — Z1231 Encounter for screening mammogram for malignant neoplasm of breast: Secondary | ICD-10-CM

## 2020-12-26 DIAGNOSIS — S46011A Strain of muscle(s) and tendon(s) of the rotator cuff of right shoulder, initial encounter: Secondary | ICD-10-CM | POA: Diagnosis not present

## 2020-12-26 DIAGNOSIS — M1711 Unilateral primary osteoarthritis, right knee: Secondary | ICD-10-CM | POA: Diagnosis not present

## 2021-01-13 DIAGNOSIS — E7800 Pure hypercholesterolemia, unspecified: Secondary | ICD-10-CM | POA: Diagnosis not present

## 2021-01-13 DIAGNOSIS — E039 Hypothyroidism, unspecified: Secondary | ICD-10-CM | POA: Diagnosis not present

## 2021-01-13 DIAGNOSIS — E1169 Type 2 diabetes mellitus with other specified complication: Secondary | ICD-10-CM | POA: Diagnosis not present

## 2021-01-13 DIAGNOSIS — E781 Pure hyperglyceridemia: Secondary | ICD-10-CM | POA: Diagnosis not present

## 2021-01-13 DIAGNOSIS — I2510 Atherosclerotic heart disease of native coronary artery without angina pectoris: Secondary | ICD-10-CM | POA: Diagnosis not present

## 2021-01-13 DIAGNOSIS — E782 Mixed hyperlipidemia: Secondary | ICD-10-CM | POA: Diagnosis not present

## 2021-01-13 DIAGNOSIS — I10 Essential (primary) hypertension: Secondary | ICD-10-CM | POA: Diagnosis not present

## 2021-01-15 ENCOUNTER — Ambulatory Visit (INDEPENDENT_AMBULATORY_CARE_PROVIDER_SITE_OTHER): Payer: Medicare Other

## 2021-01-15 ENCOUNTER — Other Ambulatory Visit: Payer: Self-pay

## 2021-01-15 ENCOUNTER — Encounter: Payer: Self-pay | Admitting: Podiatry

## 2021-01-15 ENCOUNTER — Ambulatory Visit: Payer: PPO | Admitting: Podiatry

## 2021-01-15 ENCOUNTER — Other Ambulatory Visit: Payer: Self-pay | Admitting: Podiatry

## 2021-01-15 DIAGNOSIS — G5792 Unspecified mononeuropathy of left lower limb: Secondary | ICD-10-CM

## 2021-01-15 DIAGNOSIS — E1142 Type 2 diabetes mellitus with diabetic polyneuropathy: Secondary | ICD-10-CM | POA: Diagnosis not present

## 2021-01-15 DIAGNOSIS — I2510 Atherosclerotic heart disease of native coronary artery without angina pectoris: Secondary | ICD-10-CM | POA: Diagnosis not present

## 2021-01-15 DIAGNOSIS — M778 Other enthesopathies, not elsewhere classified: Secondary | ICD-10-CM

## 2021-01-15 MED ORDER — TRIAMCINOLONE ACETONIDE 40 MG/ML IJ SUSP
20.0000 mg | Freq: Once | INTRAMUSCULAR | Status: AC
  Administered 2021-01-15: 20 mg

## 2021-01-15 NOTE — Progress Notes (Signed)
Subjective:  Patient ID: Kathryn Castillo, female    DOB: 1950-04-30,  MRN: [id number] HPI Chief Complaint  Patient presents with   Foot Pain    Dorsal forefoot/midfoot left - aching, some swelling x couple months, no injury, no treatment, ankle does feel weak and keeps falling    New Patient (Initial Visit)    Est pt 2017    70 y.o. female presents with the above complaint.   ROS: Denies fever chills nausea vomiting muscle aches pains calf pain back pain chest pain shortness of breath.  Past Medical History:  Diagnosis Date   Arthritis    Carotid stenosis    Coronary artery disease    s/p remote CABG x1 with LIMA to LAD placed in 1996.   LHC in 2010 showed an occluded LAD to the ostium and patent LIMA to the LAD.  Repeat left heart cath in 08/23/15 in setting of angina and acute diastolic CHF showed single vessel obstructive CAD with ostial LAD occlusion and  LIMA to LAD was widely patent.   Hyperlipemia    Hypertension    Hypothyroidism    S/P bunionectomy    Sleep apnea    uses a cpap   Trigger thumb of left hand 09/17/2012   Past Surgical History:  Procedure Laterality Date   CARDIAC CATHETERIZATION     x3   CARDIAC CATHETERIZATION N/A 08/23/2015   Procedure: Left Heart Cath and Cors/Grafts Angiography;  Surgeon: Peter M Martinique, MD;  Location: [place] [facility];  Service: Cardiovascular;  Laterality: N/A;   COLONOSCOPY WITH PROPOFOL N/A 10/13/2016   Procedure: COLONOSCOPY WITH PROPOFOL;  Surgeon: Garlan Fair, MD;  Location: [facility];  Service: Endoscopy;  Laterality: N/A;   CORONARY ARTERY BYPASS GRAFT  1996   x1   DILATION AND CURETTAGE OF UTERUS  1985   EYE SURGERY Left 10/02/2016   ioc for cataracts   KNEE ARTHROSCOPY WITH PATELLA RECONSTRUCTION Left 1968   left   NASAL SEPTOPLASTY W/ TURBINOPLASTY  2008   to help tolerated cpap   TRIGGER FINGER RELEASE Left 09/17/2012   Procedure: RELEASE TRIGGER FINGER/A-1 PULLEY LEFT THUMB (TENDON SHEATH INCISION);   Surgeon: Johnny Bridge, MD;  Location: [place];  Service: Orthopedics;  Laterality: Left;   TUBAL LIGATION      Current Outpatient Medications:    aspirin 81 MG tablet, Take 81 mg by mouth at bedtime. , Disp: , Rfl:    atenolol (TENORMIN) 100 MG tablet, Take 1 tablet (100 mg total) by mouth every evening., Disp: , Rfl:    cholecalciferol (VITAMIN D) 1000 UNITS tablet, Take 2,000 Units by mouth daily. , Disp: , Rfl:    diazepam (VALIUM) 10 MG tablet, Take 10 mg by mouth 2 (two) times daily as needed., Disp: , Rfl:    ibuprofen (ADVIL,MOTRIN) 200 MG tablet, Take 200 mg by mouth every 6 (six) hours as needed for mild pain or moderate pain., Disp: , Rfl:    levothyroxine (SYNTHROID, LEVOTHROID) 100 MCG tablet, Take 100 mcg by mouth daily before breakfast., Disp: , Rfl:    loratadine (CLARITIN) 10 MG tablet, Take 10 mg by mouth daily as needed for allergies., Disp: , Rfl:    meclizine (ANTIVERT) 25 MG tablet, Take 25 mg by mouth every 8 (eight) hours as needed., Disp: , Rfl:    metFORMIN (GLUCOPHAGE) 500 MG tablet, Take 500 mg by mouth at bedtime., Disp: , Rfl:    Multiple Vitamins-Minerals (MULTIVITAMIN WITH MINERALS) tablet,  Take 1 tablet by mouth at bedtime. , Disp: , Rfl:    naproxen sodium (ANAPROX) 220 MG tablet, Take 440 mg by mouth daily as needed (pain). , Disp: , Rfl:    Omega-3 Fatty Acids (FISH OIL) 1000 MG CAPS, Take 4 capsules (4,000 mg total) by mouth daily., Disp: , Rfl: 0   rosuvastatin (CRESTOR) 40 MG tablet, Take 40 mg by mouth at bedtime., Disp: , Rfl:   Allergies  Allergen Reactions   Sulfamethoxazole-Trimethoprim Other (See Comments)    REACTION: "DON'T REMEMBER"   Isosorbide Nitrate     Other reaction(s): Unknown   Penicillin G     Other reaction(s): Unknown   Review of Systems Objective:  There were no vitals filed for this visit.  General: Well developed, nourished, in no acute distress, alert and oriented x3   Dermatological: Skin is warm,  dry and supple bilateral. Nails x 10 are well maintained; remaining integument appears unremarkable at this time. There are no open sores, no preulcerative lesions, no rash or signs of infection present.  Vascular: Dorsalis Pedis artery and Posterior Tibial artery pedal pulses are 2/4 bilateral with immedate capillary fill time. Pedal hair growth present. No varicosities and no lower extremity edema present bilateral.   Neruologic: Grossly intact via light touch bilateral. Vibratory intact via tuning fork bilateral. Protective threshold with Semmes Wienstein monofilament intact to all pedal sites bilateral. Patellar and Achilles deep tendon reflexes 2+ bilateral. No Babinski or clonus noted bilateral.   Musculoskeletal: No gross boney pedal deformities bilateral. No pain, crepitus, or limitation noted with foot and ankle range of motion bilateral. Muscular strength 5/5 in all groups tested bilateral.  She has tenderness on palpation of the deep peroneal nerve as it courses across the dorsal aspect of the foot and dives into the first intermetatarsal space.  Gait: Unassisted, Nonantalgic.    Radiographs:  Radiographs taken today demonstrate no osseous abnormalities other than some osteoarthritic changes of the left midfoot.  No acute findings.  Assessment & Plan:   Assessment: Deep peroneal nerve neuritis  Plan: I injected the area today with Kenalog and local anesthetic follow-up with her in 6 weeks     Alajia Schmelzer T. [place], [place]

## 2021-02-19 ENCOUNTER — Other Ambulatory Visit: Payer: Self-pay | Admitting: Internal Medicine

## 2021-02-19 ENCOUNTER — Ambulatory Visit
Admission: RE | Admit: 2021-02-19 | Discharge: 2021-02-19 | Disposition: A | Discharge status: Home / Self Care | Payer: PPO | Source: Ambulatory Visit | Attending: Internal Medicine | Admitting: Internal Medicine

## 2021-02-19 DIAGNOSIS — E7800 Pure hypercholesterolemia, unspecified: Secondary | ICD-10-CM | POA: Diagnosis not present

## 2021-02-19 DIAGNOSIS — I771 Stricture of artery: Secondary | ICD-10-CM | POA: Diagnosis not present

## 2021-02-19 DIAGNOSIS — Z23 Encounter for immunization: Secondary | ICD-10-CM | POA: Diagnosis not present

## 2021-02-19 DIAGNOSIS — M533 Sacrococcygeal disorders, not elsewhere classified: Secondary | ICD-10-CM | POA: Diagnosis not present

## 2021-02-19 DIAGNOSIS — Z7984 Long term (current) use of oral hypoglycemic drugs: Secondary | ICD-10-CM | POA: Diagnosis not present

## 2021-02-19 DIAGNOSIS — E039 Hypothyroidism, unspecified: Secondary | ICD-10-CM | POA: Diagnosis not present

## 2021-02-19 DIAGNOSIS — E1169 Type 2 diabetes mellitus with other specified complication: Secondary | ICD-10-CM | POA: Diagnosis not present

## 2021-02-19 DIAGNOSIS — G4733 Obstructive sleep apnea (adult) (pediatric): Secondary | ICD-10-CM | POA: Diagnosis not present

## 2021-02-19 DIAGNOSIS — F419 Anxiety disorder, unspecified: Secondary | ICD-10-CM | POA: Diagnosis not present

## 2021-02-19 DIAGNOSIS — I10 Essential (primary) hypertension: Secondary | ICD-10-CM | POA: Diagnosis not present

## 2021-03-26 DIAGNOSIS — M8588 Other specified disorders of bone density and structure, other site: Secondary | ICD-10-CM | POA: Diagnosis not present

## 2021-03-26 DIAGNOSIS — E782 Mixed hyperlipidemia: Secondary | ICD-10-CM | POA: Diagnosis not present

## 2021-03-26 DIAGNOSIS — E039 Hypothyroidism, unspecified: Secondary | ICD-10-CM | POA: Diagnosis not present

## 2021-03-26 DIAGNOSIS — E7800 Pure hypercholesterolemia, unspecified: Secondary | ICD-10-CM | POA: Diagnosis not present

## 2021-03-26 DIAGNOSIS — I2510 Atherosclerotic heart disease of native coronary artery without angina pectoris: Secondary | ICD-10-CM | POA: Diagnosis not present

## 2021-03-26 DIAGNOSIS — I10 Essential (primary) hypertension: Secondary | ICD-10-CM | POA: Diagnosis not present

## 2021-03-26 DIAGNOSIS — E1169 Type 2 diabetes mellitus with other specified complication: Secondary | ICD-10-CM | POA: Diagnosis not present

## 2021-04-29 ENCOUNTER — Telehealth: Payer: Self-pay | Admitting: Interventional Cardiology

## 2021-04-29 NOTE — Telephone Encounter (Signed)
Pt c/o BP issue: STAT if pt c/o blurred vision, one-sided weakness or slurred speech  1. What are your last 5 BP readings? 190/70 last week  2. Are you having any other symptoms (ex. Dizziness, headache, blurred vision, passed out)? Headache, no energy, lightheadedness  3. What is your BP issue? Patient states she can tell her BP has been high, because of her symptoms.

## 2021-04-29 NOTE — Progress Notes (Deleted)
Cardiology Office Note:    Date:  04/29/2021   ID:  Kathryn Castillo, DOB 20-Sep-1950, MRN [id number]  PCP:  Seward Carol, [place] Cardiologist: Sinclair Grooms, MD   Reason for visit: Hypertension  History of Present Illness:    Kathryn Castillo is a 71 y.o. female with a hx of obstructive sleep apnea (Kathryn Castillo), coronary artery disease, bilateral carotid disease right greater than left 2016, ostial LAD, restenosis of ostial LAD after PCI in 1996, and subsequent LIMA to LAD 1996.  Right subclavian stenosis by Doppler (Dr. Sherren Mocha Early).  Other problems include diastolic dysfunction, primary hypertension, hyperlipidemia, and obstructive sleep apnea.  She last saw Dr. Daneen Schick in February 2022.  At that time, she mentioned an episode of left subscapular discomfort and shortness of breath.  CT of the chest showed no PE, multifocal pulmonary infiltrates.  She called our office yesterday and stated she had a blood pressure of 190/70 with headache and dizziness.  She also mentioned swelling in her feet and ankles and was scheduled for today's visit.  Hypertension -*** -Goal BP is <130/80.  Recommend DASH diet (high in vegetables, fruits, low-fat dairy products, whole grains, poultry, fish, and nuts and low in sweets, sugar-sweetened beverages, and red meats), salt restriction and increase physical activity.  Coronary artery disease - ***  Hyperlipidemia -*** -Discussed cholesterol lowering diets - Mediterranean diet, DASH diet, vegetarian diet, low-carbohydrate diet and avoidance of trans fats.  Discussed healthier choice substitutes.  Nuts, high-fiber foods, and fiber supplements may also improve lipids.    Obesity -Discussed how even a 5-10% weight loss can have cardiovascular benefits.   -Recommend moderate intensity activity for 30 minutes 5 days/week and the DASH diet.  Tobacco use  -Recommend tobacco cessation.  Reviewed physiologic effects of nicotine and  the immediate-eventual benefits of quitting including improvement in cough/breathing and reduction in cardiovascular events.  Discussed quitting tips such as removing triggers and getting support from family/friends and Quitline [place]. -USPSTF recommends one-time screening for abdominal aortic aneurysm (AAA) by ultrasound in men 50 -13 years old who have ever smoked.       Disposition - Follow-up in ***    Past Medical History:  Diagnosis Date   Arthritis    Carotid stenosis    Coronary artery disease    s/p remote CABG x1 with LIMA to LAD placed in 1996.   LHC in 2010 showed an occluded LAD to the ostium and patent LIMA to the LAD.  Repeat left heart cath in 08/23/15 in setting of angina and acute diastolic CHF showed single vessel obstructive CAD with ostial LAD occlusion and  LIMA to LAD was widely patent.   Hyperlipemia    Hypertension    Hypothyroidism    S/P bunionectomy    Sleep apnea    uses a cpap   Trigger thumb of left hand 09/17/2012    Past Surgical History:  Procedure Laterality Date   CARDIAC CATHETERIZATION     x3   CARDIAC CATHETERIZATION N/A 08/23/2015   Procedure: Left Heart Cath and Cors/Grafts Angiography;  Surgeon: Peter M Martinique, MD;  Location: [facility];  Service: Cardiovascular;  Laterality: N/A;   COLONOSCOPY WITH PROPOFOL N/A 10/13/2016   Procedure: COLONOSCOPY WITH PROPOFOL;  Surgeon: Garlan Fair, MD;  Location: [facility];  Service: Endoscopy;  Laterality: N/A;   CORONARY ARTERY BYPASS GRAFT  1996   x1   [place]  EYE SURGERY Left 10/02/2016   ioc for cataracts   KNEE ARTHROSCOPY WITH PATELLA RECONSTRUCTION Left 1968   left   NASAL SEPTOPLASTY W/ TURBINOPLASTY  2008   to help tolerated cpap   TRIGGER FINGER RELEASE Left 09/17/2012   Procedure: RELEASE TRIGGER FINGER/A-1 PULLEY LEFT THUMB (TENDON SHEATH INCISION);  Surgeon: Johnny Bridge, MD;  Location: [facility];  Service: Orthopedics;   Laterality: Left;   TUBAL LIGATION      Current Medications: No outpatient medications have been marked as taking for the 04/30/21 encounter (Appointment) with Warren Lacy, PA-C.     Allergies:   Sulfamethoxazole-trimethoprim, Isosorbide nitrate, and Penicillin g   Social History   Socioeconomic History   Marital status: Divorced    Spouse name: Not on file   Number of children: 1   Years of education: Not on file   Highest education level: Not on file  Occupational History   Not on file  Tobacco Use   Smoking status: Former    Packs/day: 1.00    Years: 35.00    Pack years: 35.00    Types: Cigarettes    Quit date: 07/30/2014    Years since quitting: 6.7   Smokeless tobacco: Never  Vaping Use   Vaping Use: Never used  Substance and Sexual Activity   Alcohol use: No    Alcohol/week: 0.0 standard drinks   Drug use: No   Sexual activity: Not on file  Other Topics Concern   Not on file  Social History Narrative   Lives alone   Social Determinants of Health   Financial Resource Strain: Not on file  Food Insecurity: Not on file  Transportation Needs: Not on file  Physical Activity: Not on file  Stress: Not on file  Social Connections: Not on file     Family History: The patient's family history includes Heart attack (age of onset: 1) in her father; Hyperlipidemia in her father; Hypertension in her father; Stroke in her father and mother.  ROS:   Please see the history of present illness.     EKGs/Labs/Other Studies Reviewed:    EKG:  The ekg ordered today demonstrates ***  Recent Labs: 05/28/2020: BUN 14; Creatinine, Ser 0.69; Potassium 4.5; Sodium 136   Recent Lipid Panel Lab Results  Component Value Date/Time   CHOL 158 01/21/2016 07:55 AM   TRIG 371 (H) 01/21/2016 07:55 AM   HDL 27 (L) 01/21/2016 07:55 AM   LDLCALC 57 01/21/2016 07:55 AM    Physical Exam:    VS:  There were no vitals taken for this visit.   No data found.  Wt Readings  from Last 3 Encounters:  05/28/20 224 lb (101.6 kg)  06/22/19 249 lb (112.9 kg)  03/15/19 251 lb (113.9 kg)     GEN: *** Well nourished, well developed in no acute distress HEENT: Normal NECK: No JVD; No carotid bruits CARDIAC: ***RRR, no murmurs, rubs, gallops RESPIRATORY:  Clear to auscultation without rales, wheezing or rhonchi  ABDOMEN: Soft, non-tender, non-distended MUSCULOSKELETAL: No edema; No deformity  SKIN: Warm and dry NEUROLOGIC:  Alert and oriented PSYCHIATRIC:  Normal affect     ASSESSMENT AND PLAN   ***   {Are you ordering a CV Procedure (e.g. stress test, cath, DCCV, TEE, etc)?   Press F2        :[id number]}    Medication Adjustments/Labs and Tests Ordered: Current medicines are reviewed at length with the patient today.  Concerns regarding medicines are outlined  above.  No orders of the defined types were placed in this encounter.  No orders of the defined types were placed in this encounter.   There are no Patient Instructions on file for this visit.   Signed, Warren Lacy, PA-C  04/29/2021 3:35 PM    [facility]

## 2021-04-29 NOTE — Telephone Encounter (Signed)
Pt does not have a BP cuff at home so has not checked BP since last week.  States she out of town at a family members and had a HA and dizziness and used their cuff and BP was 190/70.  Checked it a few times while there and it was consistently elevated.  Only medication she takes that effects BP is Atenolol 100mg  QD.  Does admit to increased salt in diet.  States she is working to cut back on that.  Notes swelling in bilateral feet and ankles.  Took a Flomax this morning to see if it would help.  Pt overdue for follow up.  Scheduled her to see Caron Presume, PA-C tomorrow.  Gave information on NL location.  Pt states she knows where it is.  Pt appreciative for call.

## 2021-04-30 ENCOUNTER — Ambulatory Visit: Payer: PPO | Admitting: Interventional Cardiology

## 2021-04-30 ENCOUNTER — Ambulatory Visit: Payer: PPO | Admitting: Physician Assistant

## 2021-04-30 ENCOUNTER — Ambulatory Visit (INDEPENDENT_AMBULATORY_CARE_PROVIDER_SITE_OTHER): Payer: PPO

## 2021-04-30 ENCOUNTER — Other Ambulatory Visit: Payer: Self-pay

## 2021-04-30 ENCOUNTER — Encounter: Payer: Self-pay | Admitting: Interventional Cardiology

## 2021-04-30 VITALS — BP 112/80 | HR 61 | Ht 60.0 in | Wt 249.2 lb

## 2021-04-30 DIAGNOSIS — R002 Palpitations: Secondary | ICD-10-CM | POA: Diagnosis not present

## 2021-04-30 DIAGNOSIS — G4733 Obstructive sleep apnea (adult) (pediatric): Secondary | ICD-10-CM | POA: Diagnosis not present

## 2021-04-30 DIAGNOSIS — E785 Hyperlipidemia, unspecified: Secondary | ICD-10-CM

## 2021-04-30 DIAGNOSIS — I10 Essential (primary) hypertension: Secondary | ICD-10-CM

## 2021-04-30 DIAGNOSIS — R079 Chest pain, unspecified: Secondary | ICD-10-CM

## 2021-04-30 DIAGNOSIS — I5032 Chronic diastolic (congestive) heart failure: Secondary | ICD-10-CM | POA: Diagnosis not present

## 2021-04-30 DIAGNOSIS — R0602 Shortness of breath: Secondary | ICD-10-CM | POA: Diagnosis not present

## 2021-04-30 DIAGNOSIS — I2510 Atherosclerotic heart disease of native coronary artery without angina pectoris: Secondary | ICD-10-CM

## 2021-04-30 MED ORDER — DAPAGLIFLOZIN PROPANEDIOL 10 MG PO TABS: 10.0000 mg | ORAL_TABLET | Freq: Every day | ORAL | 11 refills | Status: DC

## 2021-04-30 NOTE — Patient Instructions (Addendum)
Medication Instructions:  START farxiga 10 mg by mouth daily. *If you need a refill on your cardiac medications before your next appointment, please call your pharmacy*   Lab Work: CMET, CBC, BNP today.  If you have labs (blood work) drawn today and your tests are completely normal, you will receive your results only by: [place] (if you have MyChart) OR A paper copy in the mail If you have any lab test that is abnormal or we need to change your treatment, we will call you to review the results.   Testing/Procedures: A zio monitor was ordered today. It will remain on for 14 days. You will then return monitor and event diary in provided box. It takes 1-2 weeks for report to be downloaded and returned to [place]. We will call you with the results. If monitor falls off or has orange flashing light, please call Zio for further instructions.   Your physician has requested that you have an echocardiogram. Echocardiography is a painless test that uses sound waves to create images of your heart. It provides your doctor with information about the size and shape of your heart and how well your hearts chambers and valves are working. This procedure takes approximately one hour. There are no restrictions for this procedure.    Follow-Up: At [facility], you and your health needs are our priority.  As part of our continuing mission to provide you with exceptional heart care, we have created designated Provider Care Teams.  These Care Teams include your primary Cardiologist (physician) and Advanced Practice Providers (APPs -  Physician Assistants and Nurse Practitioners) who all work together to provide you with the care you need, when you need it.  We recommend signing up for the patient portal called "MyChart".  Sign up information is provided on this After Visit Summary.  MyChart is used to connect with patients for Virtual Visits (Telemedicine).  Patients are able to view lab/test results,  encounter notes, upcoming appointments, etc.  Non-urgent messages can be sent to your provider as well.   To learn more about what you can do with MyChart, go to [email or web address].    Your next appointment:   February 20 at 11:40  Provider:   Sinclair Grooms, MD

## 2021-04-30 NOTE — Progress Notes (Addendum)
Cardiology Office Note:    Date:  04/30/2021   ID:  Kathryn Castillo, DOB [birth year], MRN [id number]  PCP:  Seward Carol, MD  Cardiologist:  Sinclair Grooms, MD   Referring MD: Seward Carol, MD   Chief Complaint  Patient presents with   Irregular Heart Beat   Shortness of Breath   Coronary Artery Disease    History of Present Illness:    Kathryn Castillo is a 71 y.o. female with a hx of obstructive sleep apnea ([place]), coronary artery disease, bilateral carotid disease right greater than left 2016, ostial LAD, restenosis of ostial LAD after PCI in 1996, and subsequent LIMA to [place].  Right subclavian stenosis by Doppler (Dr. Sherren Mocha Early).  Other problems include diastolic dysfunction, primary hypertension, hyperlipidemia, and obstructive sleep apnea.   She is compliant with CPAP.  Significant dyspnea on exertion.  Elevated blood pressure recordings while at her cousin's house in [place], [place] caused her to have some concern.  Has been having some continuous left shoulder discomfort not impacted by physical activity.  Notices palpitations at night when she lays down more than during the daytime.  This is also associated with racing heart.  Can last up to 10 to 15 minutes.  Past Medical History:  Diagnosis Date   Arthritis    Carotid stenosis    Coronary artery disease    s/p remote CABG x1 with LIMA to LAD placed in 1996.   LHC in 2010 showed an occluded LAD to the ostium and patent LIMA to the LAD.  Repeat left heart cath in 08/23/15 in setting of angina and acute diastolic CHF showed single vessel obstructive CAD with ostial LAD occlusion and  LIMA to LAD was widely patent.   Hyperlipemia    Hypertension    Hypothyroidism    S/P bunionectomy    Sleep apnea    uses a cpap   Trigger thumb of left hand 09/17/2012    Past Surgical History:  Procedure Laterality Date   CARDIAC CATHETERIZATION     x3   CARDIAC CATHETERIZATION N/A 08/23/2015    Procedure: Left Heart Cath and Cors/Grafts Angiography;  Surgeon: Peter M Martinique, MD;  Location: [facility];  Service: Cardiovascular;  Laterality: N/A;   COLONOSCOPY WITH PROPOFOL N/A 10/13/2016   Procedure: COLONOSCOPY WITH PROPOFOL;  Surgeon: Garlan Fair, MD;  [facility];  Service: Endoscopy;  Laterality: N/A;   CORONARY ARTERY BYPASS GRAFT  1996   x1   DILATION AND CURETTAGE OF UTERUS  1985   EYE SURGERY Left 10/02/2016   ioc for cataracts   KNEE ARTHROSCOPY WITH PATELLA RECONSTRUCTION Left 1968   left   NASAL SEPTOPLASTY W/ TURBINOPLASTY  2008   to help tolerated cpap   TRIGGER FINGER RELEASE Left 09/17/2012   Procedure: RELEASE TRIGGER FINGER/A-1 PULLEY LEFT THUMB (TENDON SHEATH INCISION);  Surgeon: Johnny Bridge, MD;  Location: [facility];  Service: Orthopedics;  Laterality: Left;   TUBAL LIGATION      Current Medications: Current Meds  Medication Sig   aspirin 81 MG tablet Take 81 mg by mouth at bedtime.    atenolol (TENORMIN) 100 MG tablet Take 1 tablet (100 mg total) by mouth every evening.   cholecalciferol (VITAMIN D) 1000 UNITS tablet Take 2,000 Units by mouth daily.    dapagliflozin propanediol (FARXIGA) 10 MG TABS tablet Take 1 tablet (10 mg total) by mouth daily before breakfast.   ibuprofen (ADVIL,MOTRIN) 200 MG  tablet Take 200 mg by mouth every 6 (six) hours as needed for mild pain or moderate pain.   levothyroxine (SYNTHROID, LEVOTHROID) 100 MCG tablet Take 100 mcg by mouth daily before breakfast.   loratadine (CLARITIN) 10 MG tablet Take 10 mg by mouth daily as needed for allergies.   meclizine (ANTIVERT) 25 MG tablet Take 25 mg by mouth every 8 (eight) hours as needed.   metFORMIN (GLUCOPHAGE) 500 MG tablet Take 500 mg by mouth at bedtime.   Multiple Vitamins-Minerals (MULTIVITAMIN WITH MINERALS) tablet Take 1 tablet by mouth at bedtime.    naproxen sodium (ANAPROX) 220 MG tablet Take 440 mg by mouth daily as needed (pain).     Omega-3 1000 MG CAPS Take 1,000 mg by mouth daily. Pt takes 2 tablet 2000 mg once a day.   rosuvastatin (CRESTOR) 40 MG tablet Take 40 mg by mouth at bedtime.     Allergies:   Sulfamethoxazole-trimethoprim, Isosorbide nitrate, and Penicillin g   Social History   Socioeconomic History   Marital status: Divorced    Spouse name: Not on file   Number of children: 1   Years of education: Not on file   Highest education level: Not on file  Occupational History   Not on file  Tobacco Use   Smoking status: Former    Packs/day: 1.00    Years: 35.00    Pack years: 35.00    Types: Cigarettes    Quit date: 07/30/2014    Years since quitting: 6.7   Smokeless tobacco: Never  Vaping Use   Vaping Use: Never used  Substance and Sexual Activity   Alcohol use: No    Alcohol/week: 0.0 standard drinks   Drug use: No   Sexual activity: Not on file  Other Topics Concern   Not on file  Social History Narrative   Lives alone   Social Determinants of Health   Financial Resource Strain: Not on file  Food Insecurity: Not on file  Transportation Needs: Not on file  Physical Activity: Not on file  Stress: Not on file  Social Connections: Not on file     Family History: The patient's family history includes Heart attack (age of onset: 54) in her father; Hyperlipidemia in her father; Hypertension in her father; Stroke in her father and mother.  ROS:   Please see the history of present illness.    Obese.  Shoulder discomfort.  All other systems reviewed and are negative.  EKGs/Labs/Other Studies Reviewed:    The following studies were reviewed today: 2D Doppler echocardiogram 2017: Study Conclusions   - Left ventricle: The cavity size was normal. Wall thickness was    increased in a pattern of mild LVH. Systolic function was normal.    The estimated ejection fraction was in the range of 60% to 65%.    Features are consistent with a pseudonormal left ventricular    filling pattern,  with concomitant abnormal relaxation and    increased filling pressure (grade 2 diastolic dysfunction).   EKG:  EKG normal sinus rhythm, PACs, no acute change.  Incomplete right bundle.  Recent Labs: 05/28/2020: BUN 14; Creatinine, Ser 0.69; Potassium 4.5; Sodium 136  Recent Lipid Panel    Component Value Date/Time   CHOL 158 01/21/2016 0755   TRIG 371 (H) 01/21/2016 0755   HDL 27 (L) 01/21/2016 0755   CHOLHDL 5.9 (H) 01/21/2016 0755   VLDL 74 (H) 01/21/2016 0755   LDLCALC 57 01/21/2016 0755    Physical Exam:  VS:  BP 112/80    Pulse 61    Ht 5' (1.524 m)    Wt 249 lb 3.2 oz (113 kg)    SpO2 95%    BMI 48.67 kg/m     Wt Readings from Last 3 Encounters:  04/30/21 249 lb 3.2 oz (113 kg)  05/28/20 224 lb (101.6 kg)  06/22/19 249 lb (112.9 kg)     GEN: Morbid obesity. No acute distress HEENT: Normal NECK: No JVD. LYMPHATICS: No lymphadenopathy CARDIAC: No murmur. RRR no gallop, or edema. VASCULAR:  Normal Pulses. No bruits. RESPIRATORY:  Clear to auscultation without rales, wheezing or rhonchi  ABDOMEN: Soft, non-tender, non-distended, No pulsatile mass, MUSCULOSKELETAL: No deformity  SKIN: Warm and dry NEUROLOGIC:  Alert and oriented x 3 PSYCHIATRIC:  Normal affect   ASSESSMENT:    1. Palpitations   2. Shortness of breath   3. Chronic diastolic heart failure ([place])   4. Atherosclerosis of native coronary artery of native heart without angina pectoris   5. Essential hypertension   6. Obstructive sleep apnea   7. Hyperlipidemia, unspecified hyperlipidemia type   8. Chest pain, unspecified type    PLAN:    In order of problems listed above:  Given risk factors for atrial fibrillation, 2-week monitor to rule out PAF. Likely multifactorial but almost certainly a component of diastolic heart failure exists.  Check BNP.  Check BMET and CBC. See echo report above from 2017.  Start Jardiance or [place] 10 mg/day.  1 month follow-up. Continue secondary prevention. Blood  pressure today is excellent She is compliant with CPAP Continue statin therapy Left shoulder pain is not ischemic in quality.  Clinical observation for now   Medication Adjustments/Labs and Tests Ordered: Current medicines are reviewed at length with the patient today.  Concerns regarding medicines are outlined above.  Orders Placed This Encounter  Procedures   Basic metabolic panel   CBC   Pro b natriuretic peptide (BNP)   LONG TERM MONITOR (3-14 DAYS)   EKG 12-Lead   ECHOCARDIOGRAM COMPLETE   Meds ordered this encounter  Medications   dapagliflozin propanediol (FARXIGA) 10 MG TABS tablet    Sig: Take 1 tablet (10 mg total) by mouth daily before breakfast.    Dispense:  30 tablet    Refill:  11    Patient Instructions  Medication Instructions:  START farxiga 10 mg by mouth daily. *If you need a refill on your cardiac medications before your next appointment, please call your pharmacy*   Lab Work: CMET, CBC, BNP today.  If you have labs (blood work) drawn today and your tests are completely normal, you will receive your results only by: [place] (if you have MyChart) OR A paper copy in the mail If you have any lab test that is abnormal or we need to change your treatment, we will call you to review the results.   Testing/Procedures: A zio monitor was ordered today. It will remain on for 14 days. You will then return monitor and event diary in provided box. It takes 1-2 weeks for report to be downloaded and returned to [place]. We will call you with the results. If monitor falls off or has orange flashing light, please call Zio for further instructions.   Your physician has requested that you have an echocardiogram. Echocardiography is a painless test that uses sound waves to create images of your heart. It provides your doctor with information about the size and shape of your heart and how  well your hearts chambers and valves are working. This procedure takes  approximately one hour. There are no restrictions for this procedure.    Follow-Up: At [facility], you and your health needs are our priority.  As part of our continuing mission to provide you with exceptional heart care, we have created designated Provider Care Teams.  These Care Teams include your primary Cardiologist (physician) and Advanced Practice Providers (APPs -  Physician Assistants and Nurse Practitioners) who all work together to provide you with the care you need, when you need it.  We recommend signing up for the patient portal called "MyChart".  Sign up information is provided on this After Visit Summary.  MyChart is used to connect with patients for Virtual Visits (Telemedicine).  Patients are able to view lab/test results, encounter notes, upcoming appointments, etc.  Non-urgent messages can be sent to your provider as well.   To learn more about what you can do with MyChart, go to [email or web address].    Your next appointment:   February 20 at 11:40  Provider:   Sinclair Grooms, MD        Signed, Sinclair Grooms, MD  04/30/2021 9:50 AM    [place]

## 2021-04-30 NOTE — Addendum Note (Signed)
Addended by: Loren Racer on: 04/30/2021 09:55 AM   Modules accepted: Orders

## 2021-04-30 NOTE — Progress Notes (Unsigned)
Enrolled for [employer] to mail a ZIO XT long term holter monitor to the patients address on file.  

## 2021-05-01 LAB — CBC
Hematocrit: 45.1 % (ref 34.0–46.6)
Hemoglobin: 15.3 g/dL (ref 11.1–15.9)
MCH: 28.2 pg (ref 26.6–33.0)
MCHC: 33.9 g/dL (ref 31.5–35.7)
MCV: 83 fL (ref 79–97)
Platelets: 329 x10E3/uL (ref 150–450)
RBC: 5.42 x10E6/uL — ABNORMAL HIGH (ref 3.77–5.28)
RDW: 13.2 % (ref 11.7–15.4)
WBC: 7.1 x10E3/uL (ref 3.4–10.8)

## 2021-05-01 LAB — BASIC METABOLIC PANEL WITH GFR
BUN/Creatinine Ratio: 24 (ref 12–28)
BUN: 22 mg/dL (ref 8–27)
CO2: 24 mmol/L (ref 20–29)
Calcium: 10 mg/dL (ref 8.7–10.3)
Chloride: 98 mmol/L (ref 96–106)
Creatinine, Ser: 0.91 mg/dL (ref 0.57–1.00)
Glucose: 155 mg/dL — ABNORMAL HIGH (ref 70–99)
Potassium: 4.4 mmol/L (ref 3.5–5.2)
Sodium: 138 mmol/L (ref 134–144)
eGFR: 67 mL/min/1.73

## 2021-05-01 LAB — PRO B NATRIURETIC PEPTIDE: NT-Pro BNP: 120 pg/mL (ref 0–301)

## 2021-05-02 DIAGNOSIS — H6983 Other specified disorders of Eustachian tube, bilateral: Secondary | ICD-10-CM | POA: Diagnosis not present

## 2021-05-02 DIAGNOSIS — H906 Mixed conductive and sensorineural hearing loss, bilateral: Secondary | ICD-10-CM | POA: Diagnosis not present

## 2021-05-02 DIAGNOSIS — H9212 Otorrhea, left ear: Secondary | ICD-10-CM | POA: Diagnosis not present

## 2021-05-05 DIAGNOSIS — R002 Palpitations: Secondary | ICD-10-CM | POA: Diagnosis not present

## 2021-05-08 ENCOUNTER — Other Ambulatory Visit: Payer: Self-pay

## 2021-05-08 ENCOUNTER — Telehealth: Payer: Self-pay

## 2021-05-08 ENCOUNTER — Other Ambulatory Visit: Payer: PPO | Admitting: *Deleted

## 2021-05-08 DIAGNOSIS — R0602 Shortness of breath: Secondary | ICD-10-CM | POA: Diagnosis not present

## 2021-05-08 DIAGNOSIS — I5032 Chronic diastolic (congestive) heart failure: Secondary | ICD-10-CM | POA: Diagnosis not present

## 2021-05-08 NOTE — Telephone Encounter (Signed)
Pt was here for labs and reported that her Kathryn Castillo "coupon" was outdated.Marland KitchenMarland KitchenI gave her 2 other coupon cards to try and pt assistance information if she qualifies.. she says she will call the 1-800 number offered and let us know.

## 2021-05-09 LAB — BASIC METABOLIC PANEL
BUN/Creatinine Ratio: 16 (ref 12–28)
BUN: 15 mg/dL (ref 8–27)
CO2: 16 mmol/L — ABNORMAL LOW (ref 20–29)
Calcium: 9.8 mg/dL (ref 8.7–10.3)
Chloride: 107 mmol/L — ABNORMAL HIGH (ref 96–106)
Creatinine, Ser: 0.96 mg/dL (ref 0.57–1.00)
Glucose: 193 mg/dL — ABNORMAL HIGH (ref 70–99)
Potassium: 4.4 mmol/L (ref 3.5–5.2)
Sodium: 140 mmol/L (ref 134–144)
eGFR: 63 mL/min/{1.73_m2} (ref >59)

## 2021-05-13 ENCOUNTER — Telehealth: Payer: Self-pay

## 2021-05-13 DIAGNOSIS — H906 Mixed conductive and sensorineural hearing loss, bilateral: Secondary | ICD-10-CM | POA: Diagnosis not present

## 2021-05-13 DIAGNOSIS — H7292 Unspecified perforation of tympanic membrane, left ear: Secondary | ICD-10-CM | POA: Diagnosis not present

## 2021-05-13 NOTE — Telephone Encounter (Addendum)
-  Called patient regarding results left detailed message for patient. Results seen on My Chart.---- Message from Warren Lacy, PA-C sent at 05/09/2021 10:00 AM EST ----- Normal kidney function.  Normal electrolytes.   Sugar 193 -monitor sugar and carbohydrate intake.

## 2021-05-14 ENCOUNTER — Telehealth: Payer: Self-pay

## 2021-05-14 NOTE — Telephone Encounter (Signed)
**Note De-Identified [employer] Obfuscation** I completed a Farxiga 10 mg prescription page/form that was faxed to the office from [facility] and [facility] and I have emailed it to Dr [employer] nurse so she can obtain our DODs (Dr Mariana Arn) signature in Dr Thompson Caul absence and to either fax to [facility] and [place] at the fax number written on the cover letter included or to place in Medical Records to be faxed.

## 2021-05-14 NOTE — Telephone Encounter (Signed)
Paperwork completed and faxed.  °

## 2021-05-17 ENCOUNTER — Other Ambulatory Visit: Payer: Self-pay

## 2021-05-17 ENCOUNTER — Ambulatory Visit (HOSPITAL_COMMUNITY): Payer: PPO | Attending: Cardiovascular Disease

## 2021-05-17 DIAGNOSIS — I5032 Chronic diastolic (congestive) heart failure: Secondary | ICD-10-CM | POA: Diagnosis not present

## 2021-05-17 DIAGNOSIS — R079 Chest pain, unspecified: Secondary | ICD-10-CM | POA: Insufficient documentation

## 2021-05-17 DIAGNOSIS — I10 Essential (primary) hypertension: Secondary | ICD-10-CM | POA: Diagnosis not present

## 2021-05-17 DIAGNOSIS — E785 Hyperlipidemia, unspecified: Secondary | ICD-10-CM | POA: Insufficient documentation

## 2021-05-17 DIAGNOSIS — H906 Mixed conductive and sensorineural hearing loss, bilateral: Secondary | ICD-10-CM | POA: Diagnosis not present

## 2021-05-17 DIAGNOSIS — G4733 Obstructive sleep apnea (adult) (pediatric): Secondary | ICD-10-CM | POA: Insufficient documentation

## 2021-05-17 DIAGNOSIS — H6983 Other specified disorders of Eustachian tube, bilateral: Secondary | ICD-10-CM | POA: Diagnosis not present

## 2021-05-17 DIAGNOSIS — I2510 Atherosclerotic heart disease of native coronary artery without angina pectoris: Secondary | ICD-10-CM | POA: Diagnosis not present

## 2021-05-17 LAB — ECHOCARDIOGRAM COMPLETE
Area-P 1/2: 3.58 cm2
S' Lateral: 2.9 cm

## 2021-05-17 MED ORDER — PERFLUTREN LIPID MICROSPHERE
1.0000 mL | INTRAVENOUS | Status: AC | PRN
  Administered 2021-05-17: 3 mL via INTRAVENOUS

## 2021-05-23 DIAGNOSIS — R300 Dysuria: Secondary | ICD-10-CM | POA: Diagnosis not present

## 2021-05-27 DIAGNOSIS — I2510 Atherosclerotic heart disease of native coronary artery without angina pectoris: Secondary | ICD-10-CM | POA: Diagnosis not present

## 2021-05-27 DIAGNOSIS — R079 Chest pain, unspecified: Secondary | ICD-10-CM | POA: Diagnosis not present

## 2021-05-27 DIAGNOSIS — G4733 Obstructive sleep apnea (adult) (pediatric): Secondary | ICD-10-CM | POA: Diagnosis not present

## 2021-05-27 DIAGNOSIS — I10 Essential (primary) hypertension: Secondary | ICD-10-CM | POA: Diagnosis not present

## 2021-06-10 ENCOUNTER — Ambulatory Visit: Payer: PPO | Admitting: Interventional Cardiology

## 2021-06-13 NOTE — Progress Notes (Signed)
Cardiology Office Note:    Date:  06/14/2021   ID:  Kathryn Castillo, DOB Nov 05, 1950, MRN [id number]  PCP:  Seward Carol, MD  Cardiologist:  Sinclair Grooms, MD   Referring MD: Seward Carol, MD   Chief Complaint  Patient presents with   Coronary Artery Disease   Congestive Heart Failure    Diastolic    History of Present Illness:    Kathryn Castillo is a 71 y.o. female with a hx of obstructive sleep apnea ([place]), coronary artery disease, bilateral carotid disease right greater than left 2016, ostial LAD, restenosis of ostial LAD after PCI in 1996, and subsequent LIMA to [place].  Right subclavian stenosis by Doppler (Dr. Sherren Mocha Early).  Other problems include diastolic dysfunction, primary hypertension, hyperlipidemia, and obstructive sleep apnea.   Thinks that she is improved on Farxiga.  She has not had palpitations or syncope.  She is trying to get assistance from the manufacturer to be able to afford [place].  She denies orthopnea and PND.  She has had occasional palpitation but nothing lasting any significant period of time.  No peripheral edema.  Past Medical History:  Diagnosis Date   Arthritis    Carotid stenosis    Coronary artery disease    s/p remote CABG x1 with LIMA to LAD placed in 1996.   LHC in 2010 showed an occluded LAD to the ostium and patent LIMA to the LAD.  Repeat left heart cath in 08/23/15 in setting of angina and acute diastolic CHF showed single vessel obstructive CAD with ostial LAD occlusion and  LIMA to LAD was widely patent.   Hyperlipemia    Hypertension    Hypothyroidism    S/P bunionectomy    Sleep apnea    uses a cpap   Trigger thumb of left hand 09/17/2012    Past Surgical History:  Procedure Laterality Date   CARDIAC CATHETERIZATION     x3   CARDIAC CATHETERIZATION N/A 08/23/2015   Procedure: Left Heart Cath and Cors/Grafts Angiography;  Surgeon: Peter M Martinique, MD;  Location: [facility];  Service: Cardiovascular;   Laterality: N/A;   COLONOSCOPY WITH PROPOFOL N/A 10/13/2016   Procedure: COLONOSCOPY WITH PROPOFOL;  Surgeon: Garlan Fair, MD;  Location: [facility];  Service: Endoscopy;  Laterality: N/A;   CORONARY ARTERY BYPASS GRAFT  1996   x1   DILATION AND CURETTAGE OF UTERUS  1985   EYE SURGERY Left 10/02/2016   ioc for cataracts   KNEE ARTHROSCOPY WITH PATELLA RECONSTRUCTION Left 1968   left   NASAL SEPTOPLASTY W/ TURBINOPLASTY  2008   to help tolerated cpap   TRIGGER FINGER RELEASE Left 09/17/2012   Procedure: RELEASE TRIGGER FINGER/A-1 PULLEY LEFT THUMB (TENDON SHEATH INCISION);  Surgeon: Johnny Bridge, MD;  Location: [facility];  Service: Orthopedics;  Laterality: Left;   TUBAL LIGATION      Current Medications: Current Meds  Medication Sig   aspirin 81 MG tablet Take 81 mg by mouth at bedtime.    atenolol (TENORMIN) 100 MG tablet Take 1 tablet (100 mg total) by mouth every evening.   calcium carbonate (OS-CAL - DOSED IN MG OF ELEMENTAL CALCIUM) 1250 (500 Ca) MG tablet Take 500 mg by mouth daily.   cholecalciferol (VITAMIN D) 1000 UNITS tablet Take 2,000 Units by mouth daily.    dapagliflozin propanediol (FARXIGA) 10 MG TABS tablet Take 1 tablet (10 mg total) by mouth daily before breakfast.   ibuprofen (ADVIL,MOTRIN)  200 MG tablet Take 200 mg by mouth every 6 (six) hours as needed for mild pain or moderate pain.   levothyroxine (SYNTHROID, LEVOTHROID) 100 MCG tablet Take 100 mcg by mouth daily before breakfast.   loratadine (CLARITIN) 10 MG tablet Take 10 mg by mouth daily as needed for allergies.   meclizine (ANTIVERT) 25 MG tablet Take 25 mg by mouth every 8 (eight) hours as needed.   metFORMIN (GLUCOPHAGE) 500 MG tablet Take 500 mg by mouth at bedtime.   Multiple Vitamins-Minerals (MULTIVITAMIN WITH MINERALS) tablet Take 1 tablet by mouth at bedtime.    naproxen sodium (ANAPROX) 220 MG tablet Take 440 mg by mouth daily as needed (pain).    ofloxacin (FLOXIN) 0.3 %  OTIC solution as needed.   Omega-3 1000 MG CAPS Take 1,000 mg by mouth daily. Pt takes 2 tablet 2000 mg once a day.   rosuvastatin (CRESTOR) 40 MG tablet Take 40 mg by mouth at bedtime.     Allergies:   Sulfamethoxazole-trimethoprim, Isosorbide nitrate, and Penicillin g   Social History   Socioeconomic History   Marital status: Divorced    Spouse name: Not on file   Number of children: 1   Years of education: Not on file   Highest education level: Not on file  Occupational History   Not on file  Tobacco Use   Smoking status: Former    Packs/day: 1.00    Years: 35.00    Pack years: 35.00    Types: Cigarettes    Quit date: 07/30/2014    Years since quitting: 6.8   Smokeless tobacco: Never  Vaping Use   Vaping Use: Never used  Substance and Sexual Activity   Alcohol use: No    Alcohol/week: 0.0 standard drinks   Drug use: No   Sexual activity: Not on file  Other Topics Concern   Not on file  Social History Narrative   Lives alone   Social Determinants of Health   Financial Resource Strain: Not on file  Food Insecurity: Not on file  Transportation Needs: Not on file  Physical Activity: Not on file  Stress: Not on file  Social Connections: Not on file     Family History: The patient's family history includes Heart attack (age of onset: 76) in her father; Hyperlipidemia in her father; Hypertension in her father; Stroke in her father and mother.  ROS:   Please see the history of present illness.    Breathing better.  We discussed findings from telemetry monitor that did not reveal any atrial fibrillation.  She did have some SVT.  All other systems reviewed and are negative.  EKGs/Labs/Other Studies Reviewed:    The following studies were reviewed today:  LONG TERM MONITOR 05/27/2021: Study Highlights    Normal sinus rhythm with PAC's. PAC burden is high at ~ 6% No atrial fibrillation or sustained SVT. Longest run 11 beats.    2 D Doppler ECHOCARDIOGRAM  1/27/20223  IMPRESSIONS   1. Left ventricular ejection fraction, by estimation, is 60 to 65%. The  left ventricle has normal function. The left ventricle has no regional  wall motion abnormalities. Left ventricular diastolic parameters were  normal.   2. Right ventricular systolic function is normal. The right ventricular  size is normal.   3. Left atrial size was moderately dilated.   4. The mitral valve is abnormal. Mild mitral valve regurgitation. No  evidence of mitral stenosis.   5. The aortic valve is tricuspid. There is mild calcification  of the  aortic valve. Aortic valve regurgitation is not visualized. Aortic valve  sclerosis is present, with no evidence of aortic valve stenosis.   6. The inferior vena cava is normal in size with greater than 50%  respiratory variability, suggesting right atrial pressure of 3 mmHg.    EKG:  EKG EKG is not performed today  Recent Labs: 04/30/2021: Hemoglobin 15.3; NT-Pro BNP 120; Platelets 329 05/08/2021: BUN 15; Creatinine, Ser 0.96; Potassium 4.4; Sodium 140  Recent Lipid Panel    Component Value Date/Time   CHOL 158 01/21/2016 0755   TRIG 371 (H) 01/21/2016 0755   HDL 27 (L) 01/21/2016 0755   CHOLHDL 5.9 (H) 01/21/2016 0755   VLDL 74 (H) 01/21/2016 0755   LDLCALC 57 01/21/2016 0755    Physical Exam:    VS:  BP 118/82    Pulse (!) 59    Ht 5' (1.524 m)    Wt 252 lb 3.2 oz (114.4 kg)    SpO2 97%    BMI 49.25 kg/m     Wt Readings from Last 3 Encounters:  06/14/21 252 lb 3.2 oz (114.4 kg)  04/30/21 249 lb 3.2 oz (113 kg)  05/28/20 224 lb (101.6 kg)     GEN: Morbid obesity. No acute distress HEENT: Normal NECK: No JVD. LYMPHATICS: No lymphadenopathy CARDIAC: No murmur. RRR no gallop, or edema. VASCULAR:  Normal Pulses. No bruits. RESPIRATORY:  Clear to auscultation without rales, wheezing or rhonchi  ABDOMEN: Soft, non-tender, non-distended, No pulsatile mass, MUSCULOSKELETAL: No deformity  SKIN: Warm and dry NEUROLOGIC:   Alert and oriented x 3 PSYCHIATRIC:  Normal affect   ASSESSMENT:    1. Shortness of breath   2. Atherosclerosis of native coronary artery of native heart without angina pectoris   3. Essential hypertension   4. Obstructive sleep apnea   5. Chronic diastolic heart failure (HCC)   6. Palpitations    PLAN:    In order of problems listed above:  Diastolic heart failure, improved on Farxiga.  Continue therapy. Secondary prevention reviewed\ Blood pressure is well controlled Encourage compliance with CPAP Continue Farxiga No evidence of atrial fib.   Medication Adjustments/Labs and Tests Ordered: Current medicines are reviewed at length with the patient today.  Concerns regarding medicines are outlined above.  No orders of the defined types were placed in this encounter.  No orders of the defined types were placed in this encounter.   There are no Patient Instructions on file for this visit.   Signed, Sinclair Grooms, MD  06/14/2021 12:22 PM    [facility]

## 2021-06-14 ENCOUNTER — Ambulatory Visit: Payer: PPO | Admitting: Interventional Cardiology

## 2021-06-14 ENCOUNTER — Encounter: Payer: Self-pay | Admitting: Interventional Cardiology

## 2021-06-14 ENCOUNTER — Other Ambulatory Visit: Payer: Self-pay

## 2021-06-14 VITALS — BP 118/82 | HR 59 | Ht 60.0 in | Wt 252.2 lb

## 2021-06-14 DIAGNOSIS — R002 Palpitations: Secondary | ICD-10-CM

## 2021-06-14 DIAGNOSIS — I2510 Atherosclerotic heart disease of native coronary artery without angina pectoris: Secondary | ICD-10-CM

## 2021-06-14 DIAGNOSIS — G4733 Obstructive sleep apnea (adult) (pediatric): Secondary | ICD-10-CM

## 2021-06-14 DIAGNOSIS — R0602 Shortness of breath: Secondary | ICD-10-CM

## 2021-06-14 DIAGNOSIS — I5032 Chronic diastolic (congestive) heart failure: Secondary | ICD-10-CM | POA: Diagnosis not present

## 2021-06-14 DIAGNOSIS — I10 Essential (primary) hypertension: Secondary | ICD-10-CM

## 2021-06-14 DIAGNOSIS — R079 Chest pain, unspecified: Secondary | ICD-10-CM

## 2021-06-14 NOTE — Patient Instructions (Signed)
Medication Instructions:  Your physician recommends that you continue on your current medications as directed. Please refer to the Current Medication list given to you today.  *If you need a refill on your cardiac medications before your next appointment, please call your pharmacy*   Lab Work: None If you have labs (blood work) drawn today and your tests are completely normal, you will receive your results only by: MyChart Message (if you have MyChart) OR A paper copy in the mail If you have any lab test that is abnormal or we need to change your treatment, we will call you to review the results.   Testing/Procedures: None   Follow-Up: At [facility], you and your health needs are our priority.  As part of our continuing mission to provide you with exceptional heart care, we have created designated Provider Care Teams.  These Care Teams include your primary Cardiologist (physician) and Advanced Practice Providers (APPs -  Physician Assistants and Nurse Practitioners) who all work together to provide you with the care you need, when you need it.  We recommend signing up for the patient portal called "MyChart".  Sign up information is provided on this After Visit Summary.  MyChart is used to connect with patients for Virtual Visits (Telemedicine).  Patients are able to view lab/test results, encounter notes, upcoming appointments, etc.  Non-urgent messages can be sent to your provider as well.   To learn more about what you can do with MyChart, go to [email or web address].    Your next appointment:   6-9 month(s)  The format for your next appointment:   In Person  Provider:   Henry W Smith III, MD     Other Instructions   

## 2021-06-17 DIAGNOSIS — I10 Essential (primary) hypertension: Secondary | ICD-10-CM | POA: Diagnosis not present

## 2021-06-17 DIAGNOSIS — G4710 Hypersomnia, unspecified: Secondary | ICD-10-CM | POA: Diagnosis not present

## 2021-06-17 DIAGNOSIS — G4733 Obstructive sleep apnea (adult) (pediatric): Secondary | ICD-10-CM | POA: Diagnosis not present

## 2021-06-24 DIAGNOSIS — M7581 Other shoulder lesions, right shoulder: Secondary | ICD-10-CM | POA: Diagnosis not present

## 2021-06-24 DIAGNOSIS — M1711 Unilateral primary osteoarthritis, right knee: Secondary | ICD-10-CM | POA: Diagnosis not present

## 2021-09-12 DIAGNOSIS — M8588 Other specified disorders of bone density and structure, other site: Secondary | ICD-10-CM | POA: Diagnosis not present

## 2021-09-12 DIAGNOSIS — E039 Hypothyroidism, unspecified: Secondary | ICD-10-CM | POA: Diagnosis not present

## 2021-09-12 DIAGNOSIS — E1169 Type 2 diabetes mellitus with other specified complication: Secondary | ICD-10-CM | POA: Diagnosis not present

## 2021-09-12 DIAGNOSIS — G4733 Obstructive sleep apnea (adult) (pediatric): Secondary | ICD-10-CM | POA: Diagnosis not present

## 2021-09-12 DIAGNOSIS — I2510 Atherosclerotic heart disease of native coronary artery without angina pectoris: Secondary | ICD-10-CM | POA: Diagnosis not present

## 2021-09-12 DIAGNOSIS — E7800 Pure hypercholesterolemia, unspecified: Secondary | ICD-10-CM | POA: Diagnosis not present

## 2021-09-12 DIAGNOSIS — Z0000 Encounter for general adult medical examination without abnormal findings: Secondary | ICD-10-CM | POA: Diagnosis not present

## 2021-09-12 DIAGNOSIS — I779 Disorder of arteries and arterioles, unspecified: Secondary | ICD-10-CM | POA: Diagnosis not present

## 2021-09-12 DIAGNOSIS — I10 Essential (primary) hypertension: Secondary | ICD-10-CM | POA: Diagnosis not present

## 2021-09-17 ENCOUNTER — Other Ambulatory Visit: Payer: Self-pay | Admitting: Internal Medicine

## 2021-09-17 DIAGNOSIS — I10 Essential (primary) hypertension: Secondary | ICD-10-CM | POA: Diagnosis not present

## 2021-09-17 DIAGNOSIS — G4733 Obstructive sleep apnea (adult) (pediatric): Secondary | ICD-10-CM | POA: Diagnosis not present

## 2021-09-17 DIAGNOSIS — M8580 Other specified disorders of bone density and structure, unspecified site: Secondary | ICD-10-CM

## 2021-09-17 DIAGNOSIS — G4710 Hypersomnia, unspecified: Secondary | ICD-10-CM | POA: Diagnosis not present

## 2021-09-23 ENCOUNTER — Other Ambulatory Visit: Payer: Self-pay | Admitting: Internal Medicine

## 2021-09-23 DIAGNOSIS — Z1231 Encounter for screening mammogram for malignant neoplasm of breast: Secondary | ICD-10-CM

## 2021-10-08 DIAGNOSIS — M170 Bilateral primary osteoarthritis of knee: Secondary | ICD-10-CM | POA: Diagnosis not present

## 2021-10-17 ENCOUNTER — Telehealth: Payer: Self-pay

## 2021-10-17 NOTE — Telephone Encounter (Signed)
[facility]/ID#[id number]/PHONE# [phone]/ TRACKING # [phone]

## 2021-11-20 DIAGNOSIS — Z961 Presence of intraocular lens: Secondary | ICD-10-CM | POA: Diagnosis not present

## 2021-11-20 DIAGNOSIS — R51 Headache with orthostatic component, not elsewhere classified: Secondary | ICD-10-CM | POA: Diagnosis not present

## 2021-12-13 ENCOUNTER — Other Ambulatory Visit: Payer: Self-pay

## 2021-12-13 MED ORDER — DAPAGLIFLOZIN PROPANEDIOL 10 MG PO TABS: 10.0000 mg | ORAL_TABLET | Freq: Every day | ORAL | 3 refills | Status: AC

## 2022-01-08 DIAGNOSIS — G4733 Obstructive sleep apnea (adult) (pediatric): Secondary | ICD-10-CM | POA: Diagnosis not present

## 2022-01-08 DIAGNOSIS — I10 Essential (primary) hypertension: Secondary | ICD-10-CM | POA: Diagnosis not present

## 2022-01-08 DIAGNOSIS — G4710 Hypersomnia, unspecified: Secondary | ICD-10-CM | POA: Diagnosis not present

## 2022-01-20 DIAGNOSIS — M170 Bilateral primary osteoarthritis of knee: Secondary | ICD-10-CM | POA: Diagnosis not present

## 2022-03-04 ENCOUNTER — Ambulatory Visit
Admission: RE | Admit: 2022-03-04 | Discharge: 2022-03-04 | Disposition: A | Discharge status: Home / Self Care | Payer: PPO | Source: Ambulatory Visit | Attending: Internal Medicine | Admitting: Internal Medicine

## 2022-03-04 DIAGNOSIS — Z1231 Encounter for screening mammogram for malignant neoplasm of breast: Secondary | ICD-10-CM

## 2022-03-04 DIAGNOSIS — Z780 Asymptomatic menopausal state: Secondary | ICD-10-CM | POA: Diagnosis not present

## 2022-03-04 DIAGNOSIS — M8580 Other specified disorders of bone density and structure, unspecified site: Secondary | ICD-10-CM

## 2022-03-04 DIAGNOSIS — M8589 Other specified disorders of bone density and structure, multiple sites: Secondary | ICD-10-CM | POA: Diagnosis not present

## 2022-03-06 DIAGNOSIS — G4733 Obstructive sleep apnea (adult) (pediatric): Secondary | ICD-10-CM | POA: Diagnosis not present

## 2022-03-06 DIAGNOSIS — E039 Hypothyroidism, unspecified: Secondary | ICD-10-CM | POA: Diagnosis not present

## 2022-03-06 DIAGNOSIS — R0989 Other specified symptoms and signs involving the circulatory and respiratory systems: Secondary | ICD-10-CM | POA: Diagnosis not present

## 2022-03-06 DIAGNOSIS — I2510 Atherosclerotic heart disease of native coronary artery without angina pectoris: Secondary | ICD-10-CM | POA: Diagnosis not present

## 2022-03-06 DIAGNOSIS — I771 Stricture of artery: Secondary | ICD-10-CM | POA: Diagnosis not present

## 2022-03-06 DIAGNOSIS — I10 Essential (primary) hypertension: Secondary | ICD-10-CM | POA: Diagnosis not present

## 2022-03-06 DIAGNOSIS — E1169 Type 2 diabetes mellitus with other specified complication: Secondary | ICD-10-CM | POA: Diagnosis not present

## 2022-03-06 DIAGNOSIS — E7800 Pure hypercholesterolemia, unspecified: Secondary | ICD-10-CM | POA: Diagnosis not present

## 2022-03-09 NOTE — Progress Notes (Deleted)
Cardiology Office Note:    Date:  03/09/2022   ID:  Kathryn Castillo, DOB 1950/06/10, MRN [id number]  PCP:  Seward Carol, MD  Cardiologist:  Sinclair Grooms, MD   Referring MD: Seward Carol, MD   No chief complaint on file.   History of Present Illness:    Kathryn Castillo is a 70 y.o. female with a hx of obstructive sleep apnea (Turn[place]), coronary artery disease, bilateral carotid disease right greater than left 2016, ostial LAD, restenosis of ostial LAD after PCI in 1996, and subsequent LIMA to LAD 1996.  Right subclavian stenosis by Doppler (Dr. Sherren Mocha Early).  Other problems include diastolic dysfunction, primary hypertension, hyperlipidemia, and obstructive sleep apnea.    ***  Past Medical History:  Diagnosis Date   Arthritis    Carotid stenosis    Coronary artery disease    s/p remote CABG x1 with LIMA to LAD placed in 1996.   LHC in 2010 showed an occluded LAD to the ostium and patent LIMA to the LAD.  Repeat left heart cath in 08/23/15 in setting of angina and acute diastolic CHF showed single vessel obstructive CAD with ostial LAD occlusion and  LIMA to LAD was widely patent.   Hyperlipemia    Hypertension    Hypothyroidism    S/P bunionectomy    Sleep apnea    uses a cpap   Trigger thumb of left hand 09/17/2012    Past Surgical History:  Procedure Laterality Date   CARDIAC CATHETERIZATION     x3   CARDIAC CATHETERIZATION N/A 08/23/2015   Procedure: Left Heart Cath and Cors/Grafts Angiography;  Surgeon: Peter M Martinique, MD;  Location: [facility];  Service: Cardiovascular;  Laterality: N/A;   COLONOSCOPY WITH PROPOFOL N/A 10/13/2016   Procedure: COLONOSCOPY WITH PROPOFOL;  Surgeon: Garlan Fair, MD;  [facility]: [facility];  Service: Endoscopy;  Laterality: N/A;   CORONARY ARTERY BYPASS GRAFT  1996   x1   DILATION AND CURETTAGE OF UTERUS  1985   EYE SURGERY Left 10/02/2016   ioc for cataracts   KNEE ARTHROSCOPY WITH PATELLA RECONSTRUCTION  Left 1968   left   NASAL SEPTOPLASTY W/ TURBINOPLASTY  2008   to help tolerated cpap   TRIGGER FINGER RELEASE Left 09/17/2012   Procedure: RELEASE TRIGGER FINGER/A-1 PULLEY LEFT THUMB (TENDON SHEATH INCISION);  Surgeon: Johnny Bridge, MD;  Location: [facility];  Service: Orthopedics;  Laterality: Left;   TUBAL LIGATION      Current Medications: No outpatient medications have been marked as taking for the 03/11/22 encounter (Appointment) with Belva Crome, MD.     Allergies:   Sulfamethoxazole-trimethoprim, Isosorbide nitrate, and Penicillin g   Social History   Socioeconomic History   Marital status: Divorced    Spouse name: Not on file   Number of children: 1   Years of education: Not on file   Highest education level: Not on file  Occupational History   Not on file  Tobacco Use   Smoking status: Former    Packs/day: 1.00    Years: 35.00    Total pack years: 35.00    Types: Cigarettes    Quit date: 07/30/2014    Years since quitting: 7.6   Smokeless tobacco: Never  Vaping Use   Vaping Use: Never used  Substance and Sexual Activity   Alcohol use: No    Alcohol/week: 0.0 standard drinks of alcohol   Drug use: No   Sexual activity:  Not on file  Other Topics Concern   Not on file  Social History Narrative   Lives alone   Social Determinants of Health   Financial Resource Strain: Not on file  Food Insecurity: Not on file  Transportation Needs: Not on file  Physical Activity: Not on file  Stress: Not on file  Social Connections: Not on file     Family History: The patient's family history includes Heart attack (age of onset: 80) in her father; Hyperlipidemia in her father; Hypertension in her father; Stroke in her father and mother.  ROS:   Please see the history of present illness.    *** All other systems reviewed and are negative.  EKGs/Labs/Other Studies Reviewed:    The following studies were reviewed today: LONG TERM MONITOR (3-14  DAYS): Result Notes  Study Highlights Normal sinus rhythm with PAC's. PAC burden is high at ~ 6% No atrial fibrillation or sustained SVT. Longest run 11 beats  EKG:  EKG ***  Recent Labs: 04/30/2021: Hemoglobin 15.3; NT-Pro BNP 120; Platelets 329 05/08/2021: BUN 15; Creatinine, Ser 0.96; Potassium 4.4; Sodium 140  Recent Lipid Panel    Component Value Date/Time   CHOL 158 01/21/2016 0755   TRIG 371 (H) 01/21/2016 0755   HDL 27 (L) 01/21/2016 0755   CHOLHDL 5.9 (H) 01/21/2016 0755   VLDL 74 (H) 01/21/2016 0755   LDLCALC 57 01/21/2016 0755    Physical Exam:    VS:  There were no vitals taken for this visit.    Wt Readings from Last 3 Encounters:  06/14/21 252 lb 3.2 oz (114.4 kg)  04/30/21 249 lb 3.2 oz (113 kg)  05/28/20 224 lb (101.6 kg)     GEN: ***. No acute distress HEENT: Normal NECK: No JVD. LYMPHATICS: No lymphadenopathy CARDIAC: *** murmur. RRR *** gallop, or edema. VASCULAR: *** Normal Pulses. No bruits. RESPIRATORY:  Clear to auscultation without rales, wheezing or rhonchi  ABDOMEN: Soft, non-tender, non-distended, No pulsatile mass, MUSCULOSKELETAL: No deformity  SKIN: Warm and dry NEUROLOGIC:  Alert and oriented x 3 PSYCHIATRIC:  Normal affect   ASSESSMENT:    1. Atherosclerosis of native coronary artery of native heart without angina pectoris   2. Essential hypertension   3. Obstructive sleep apnea   4. Chronic diastolic heart failure ([place])   5. Hyperlipidemia, unspecified hyperlipidemia type   6. Palpitations    PLAN:    In order of problems listed above:  ***   Medication Adjustments/Labs and Tests Ordered: Current medicines are reviewed at length with the patient today.  Concerns regarding medicines are outlined above.  No orders of the defined types were placed in this encounter.  No orders of the defined types were placed in this encounter.   There are no Patient Instructions on file for this visit.   Signed, Sinclair Grooms, MD   03/09/2022 2:13 PM    [facility]

## 2022-03-11 ENCOUNTER — Ambulatory Visit: Payer: PPO | Admitting: Interventional Cardiology

## 2022-03-11 DIAGNOSIS — I2510 Atherosclerotic heart disease of native coronary artery without angina pectoris: Secondary | ICD-10-CM

## 2022-03-11 DIAGNOSIS — G4733 Obstructive sleep apnea (adult) (pediatric): Secondary | ICD-10-CM

## 2022-03-11 DIAGNOSIS — R002 Palpitations: Secondary | ICD-10-CM

## 2022-03-11 DIAGNOSIS — E785 Hyperlipidemia, unspecified: Secondary | ICD-10-CM

## 2022-03-11 DIAGNOSIS — I10 Essential (primary) hypertension: Secondary | ICD-10-CM

## 2022-03-11 DIAGNOSIS — I5032 Chronic diastolic (congestive) heart failure: Secondary | ICD-10-CM

## 2022-04-02 NOTE — Progress Notes (Unsigned)
Cardiology Office Note:    Date:  04/03/2022   ID:  Kathryn Castillo, DOB [birth year], MRN [id number]  PCP:  Seward Carol, MD  Cardiologist:  Sinclair Grooms, MD   Referring MD: Seward Carol, MD   Chief Complaint  Patient presents with   Coronary Artery Disease   Hypertension   Hyperlipidemia    History of Present Illness:    Kathryn Castillo is a 71 y.o. female with a hx of obstructive sleep apnea ([place]), coronary artery disease, bilateral carotid disease right greater than left 2016, ostial LAD, restenosis of ostial LAD after PCI in 1996, and subsequent LIMA to [place]ms.  Right subclavian stenosis by Doppler (Dr. Sherren Mocha Early).  Other problems include diastolic dysfunction, primary hypertension, hyperlipidemia, and obstructive sleep apnea.   She is doing well.  No symptoms that suggest angina.  Denies orthopnea and PND.  Breathing has significantly improved on Farxiga.  No lower extremity swelling.  No nitroglycerin use.  Past Medical History:  Diagnosis Date   Arthritis    Carotid stenosis    Coronary artery disease    s/p remote CABG x1 with LIMA to LAD placed in 1996.   LHC in 2010 showed an occluded LAD to the ostium and patent LIMA to the LAD.  Repeat left heart cath in 08/23/15 in setting of angina and acute diastolic CHF showed single vessel obstructive CAD with ostial LAD occlusion and  LIMA to LAD was widely patent.   Hyperlipemia    Hypertension    Hypothyroidism    S/P bunionectomy    Sleep apnea    uses a cpap   Trigger thumb of left hand 09/17/2012    Past Surgical History:  Procedure Laterality Date   CARDIAC CATHETERIZATION     x3   CARDIAC CATHETERIZATION N/A 08/23/2015   Procedure: Left Heart Cath and Cors/Grafts Angiography;  Surgeon: Peter M Martinique, MD;  Location: [facility];  Service: Cardiovascular;  Laterality: N/A;   COLONOSCOPY WITH PROPOFOL N/A 10/13/2016   Procedure: COLONOSCOPY WITH PROPOFOL;  Surgeon: Garlan Fair, MD;   Location: [facility];  Service: Endoscopy;  Laterality: N/A;   CORONARY ARTERY BYPASS GRAFT  1996   x1   DILATION AND CURETTAGE OF UTERUS  1985   EYE SURGERY Left 10/02/2016   ioc for cataracts   KNEE ARTHROSCOPY WITH PATELLA RECONSTRUCTION Left 1968   left   NASAL SEPTOPLASTY W/ TURBINOPLASTY  2008   to help tolerated cpap   TRIGGER FINGER RELEASE Left 09/17/2012   Procedure: RELEASE TRIGGER FINGER/A-1 PULLEY LEFT THUMB (TENDON SHEATH INCISION);  Surgeon: Johnny Bridge, MD;  Location: [facility];  Service: Orthopedics;  Laterality: Left;   TUBAL LIGATION      Current Medications: Current Meds  Medication Sig   aspirin 81 MG tablet Take 81 mg by mouth at bedtime.    atenolol (TENORMIN) 100 MG tablet Take 1 tablet (100 mg total) by mouth every evening.   calcium carbonate (OS-CAL - DOSED IN MG OF ELEMENTAL CALCIUM) 1250 (500 Ca) MG tablet Take 500 mg by mouth daily.   cholecalciferol (VITAMIN D) 1000 UNITS tablet Take 2,000 Units by mouth daily.    dapagliflozin propanediol (FARXIGA) 10 MG TABS tablet Take 1 tablet (10 mg total) by mouth daily before breakfast.   ibuprofen (ADVIL,MOTRIN) 200 MG tablet Take 200 mg by mouth every 6 (six) hours as needed for mild pain or moderate pain.   levothyroxine (SYNTHROID, LEVOTHROID) 100 MCG  tablet Take 100 mcg by mouth daily before breakfast.   loratadine (CLARITIN) 10 MG tablet Take 10 mg by mouth daily as needed for allergies.   meclizine (ANTIVERT) 25 MG tablet Take 25 mg by mouth every 8 (eight) hours as needed.   metFORMIN (GLUCOPHAGE) 500 MG tablet Take 500 mg by mouth at bedtime.   Multiple Vitamins-Minerals (MULTIVITAMIN WITH MINERALS) tablet Take 1 tablet by mouth at bedtime.    naproxen sodium (ANAPROX) 220 MG tablet Take 440 mg by mouth daily as needed (pain).    ofloxacin (FLOXIN) 0.3 % OTIC solution as needed.   Omega-3 1000 MG CAPS Take 1,000 mg by mouth daily. Pt takes 2 tablet 2000 mg once a day.   OZEMPIC, 0.25  OR 0.5 MG/DOSE, 2 MG/3ML SOPN Inject 0.25 mg as directed once a week.   rosuvastatin (CRESTOR) 40 MG tablet Take 40 mg by mouth at bedtime.     Allergies:   Sulfamethoxazole-trimethoprim, Isosorbide nitrate, and Penicillin g   Social History   Socioeconomic History   Marital status: Divorced    Spouse name: Not on file   Number of children: 1   Years of education: Not on file   Highest education level: Not on file  Occupational History   Not on file  Tobacco Use   Smoking status: Former    Packs/day: 1.00    Years: 35.00    Total pack years: 35.00    Types: Cigarettes    Quit date: 07/30/2014    Years since quitting: 7.6   Smokeless tobacco: Never  Vaping Use   Vaping Use: Never used  Substance and Sexual Activity   Alcohol use: No    Alcohol/week: 0.0 standard drinks of alcohol   Drug use: No   Sexual activity: Not on file  Other Topics Concern   Not on file  Social History Narrative   Lives alone   Social Determinants of Health   Financial Resource Strain: Not on file  Food Insecurity: Not on file  Transportation Needs: Not on file  Physical Activity: Not on file  Stress: Not on file  Social Connections: Not on file     Family History: The patient's family history includes Heart attack (age of onset: 73) in her father; Hyperlipidemia in her father; Hypertension in her father; Stroke in her father and mother.  ROS:   Please see the history of present illness.    Compliant with CPAP all other systems reviewed and are negative.  EKGs/Labs/Other Studies Reviewed:    The following studies were reviewed today: Long-term monitor, 06/03/2021: Study Highlights Normal sinus rhythm with PAC's. PAC burden is high at ~ 6% No atrial fibrillation or sustained SVT. Longest run 11 beats   2D Doppler echocardiogram January 2023: IMPRESSIONS   1. Left ventricular ejection fraction, by estimation, is 60 to 65%. The  left ventricle has normal function. The left ventricle  has no regional  wall motion abnormalities. Left ventricular diastolic parameters were  normal.   2. Right ventricular systolic function is normal. The right ventricular  size is normal.   3. Left atrial size was moderately dilated.   4. The mitral valve is abnormal. Mild mitral valve regurgitation. No  evidence of mitral stenosis.   5. The aortic valve is tricuspid. There is mild calcification of the  aortic valve. Aortic valve regurgitation is not visualized. Aortic valve  sclerosis is present, with no evidence of aortic valve stenosis.   6. The inferior vena cava is normal  in size with greater than 50%  respiratory variability, suggesting right atrial pressure of 3 mmHg.   EKG:  EKG normal sinus rhythm, first AV block 238 ms, incomplete right bundle branch block, nonspecific T wave flattening.  Compared to prior tracing April 30, 2021, no significant changes noted.  Recent Labs: 04/30/2021: Hemoglobin 15.3; NT-Pro BNP 120; Platelets 329 05/08/2021: BUN 15; Creatinine, Ser 0.96; Potassium 4.4; Sodium 140  Recent Lipid Panel    Component Value Date/Time   CHOL 158 01/21/2016 0755   TRIG 371 (H) 01/21/2016 0755   HDL 27 (L) 01/21/2016 0755   CHOLHDL 5.9 (H) 01/21/2016 0755   VLDL 74 (H) 01/21/2016 0755   LDLCALC 57 01/21/2016 0755    Physical Exam:    VS:  BP 126/76   Pulse 64   Ht 5' (1.524 m)   Wt 235 lb (106.6 kg)   SpO2 98%   BMI 45.90 kg/m     Wt Readings from Last 3 Encounters:  04/03/22 235 lb (106.6 kg)  06/14/21 252 lb 3.2 oz (114.4 kg)  04/30/21 249 lb 3.2 oz (113 kg)     GEN: Morbid. No acute distress HEENT: Normal NECK: No JVD. LYMPHATICS: No lymphadenopathy CARDIAC: No murmur. RRR no gallop, or edema. VASCULAR:  Normal Pulses. No bruits. RESPIRATORY:  Clear to auscultation without rales, wheezing or rhonchi  ABDOMEN: Soft, non-tender, non-distended, No pulsatile mass, MUSCULOSKELETAL: No deformity  SKIN: Warm and dry NEUROLOGIC:  Alert and oriented  x 3 PSYCHIATRIC:  Normal affect   ASSESSMENT:    1. Atherosclerosis of native coronary artery of native heart without angina pectoris   2. Essential hypertension   3. Chronic diastolic heart failure ([place])   4. Obstructive sleep apnea   5. Hyperlipidemia, unspecified hyperlipidemia type    PLAN:    In order of problems listed above:  Secondary prevention reviewed.  LDL less than 70 on current dose of rosuvastatin.  I advised that she could probably stop taking over-the-counter omega-3 as there is no data other than for Vascepa. Continue Tenormin Continue Farxiga CPAP compliance encouraged Continue rosuvastatin 40 mg/day. Continue Ozempic for diabetes control and hopefully weight loss.  Overall education and awareness concerning primary/secondary risk prevention was discussed in detail: LDL less than 70, hemoglobin A1c less than 7, blood pressure target less than 130/80 mmHg, >150 minutes of moderate aerobic activity per week, avoidance of smoking, weight control (via diet and exercise), and continued surveillance/management of/for obstructive sleep apnea.   Dr. Johney Frame in 1 year.   Medication Adjustments/Labs and Tests Ordered: Current medicines are reviewed at length with the patient today.  Concerns regarding medicines are outlined above.  Orders Placed This Encounter  Procedures   EKG 12-Lead   No orders of the defined types were placed in this encounter.   Patient Instructions  Medication Instructions:  Your physician recommends that you continue on your current medications as directed. Please refer to the Current Medication list given to you today.  *If you need a refill on your cardiac medications before your next appointment, please call your pharmacy*  Follow-Up: At [facility], you and your health needs are our priority.  As part of our continuing mission to provide you with exceptional heart care, we have created designated Provider Care Teams.  These  Care Teams include your primary Cardiologist (physician) and Advanced Practice Providers (APPs -  Physician Assistants and Nurse Practitioners) who all work together to provide you with the care you need, when you need  it.  Your next appointment:   1 year(s)  The format for your next appointment:   In Person  Provider:   Gwyndolyn Kaufman, MD  Important Information About Sugar         Signed, Sinclair Grooms, MD  04/03/2022 10:57 AM    [place]

## 2022-04-03 ENCOUNTER — Ambulatory Visit: Payer: PPO | Attending: Interventional Cardiology | Admitting: Interventional Cardiology

## 2022-04-03 ENCOUNTER — Encounter: Payer: Self-pay | Admitting: Interventional Cardiology

## 2022-04-03 VITALS — BP 126/76 | HR 64 | Ht 60.0 in | Wt 235.0 lb

## 2022-04-03 DIAGNOSIS — I10 Essential (primary) hypertension: Secondary | ICD-10-CM

## 2022-04-03 DIAGNOSIS — I2510 Atherosclerotic heart disease of native coronary artery without angina pectoris: Secondary | ICD-10-CM | POA: Diagnosis not present

## 2022-04-03 DIAGNOSIS — I5032 Chronic diastolic (congestive) heart failure: Secondary | ICD-10-CM

## 2022-04-03 DIAGNOSIS — E785 Hyperlipidemia, unspecified: Secondary | ICD-10-CM | POA: Diagnosis not present

## 2022-04-03 DIAGNOSIS — G4733 Obstructive sleep apnea (adult) (pediatric): Secondary | ICD-10-CM

## 2022-04-03 NOTE — Patient Instructions (Signed)
Medication Instructions:  Your physician recommends that you continue on your current medications as directed. Please refer to the Current Medication list given to you today.  *If you need a refill on your cardiac medications before your next appointment, please call your pharmacy*  Follow-Up: At [facility], you and your health needs are our priority.  As part of our continuing mission to provide you with exceptional heart care, we have created designated Provider Care Teams.  These Care Teams include your primary Cardiologist (physician) and Advanced Practice Providers (APPs -  Physician Assistants and Nurse Practitioners) who all work together to provide you with the care you need, when you need it.  Your next appointment:   1 year(s)  The format for your next appointment:   In Person  Provider:   Gwyndolyn Kaufman, MD  Important Information About Sugar

## 2022-04-07 DIAGNOSIS — M170 Bilateral primary osteoarthritis of knee: Secondary | ICD-10-CM | POA: Diagnosis not present

## 2022-05-08 DIAGNOSIS — M25511 Pain in right shoulder: Secondary | ICD-10-CM | POA: Diagnosis not present

## 2022-05-09 ENCOUNTER — Telehealth: Payer: Self-pay | Admitting: *Deleted

## 2022-05-09 NOTE — Telephone Encounter (Signed)
   Pre-operative Risk Assessment    Patient Name: Kathryn Castillo  DOB: 04/20/1951 MRN: [id number]      Request for Surgical Clearance    Procedure:   RIGHT TOTAL KNEE REPLACEMENT  Date of Surgery:  Clearance TBD                                 Surgeon:  Johnny Bridge, MD Surgeon's Group or Practice Name:  [facility] Phone number:  [phone] Fax number:  [phone]   Type of Clearance Requested:   - Pharmacy:  Hold Aspirin NOT INDICATED HOW LONG   Type of Anesthesia:  Spinal   Additional requests/questions:    [name]   05/09/2022, 8:06 AM

## 2022-05-09 NOTE — Telephone Encounter (Signed)
     Primary Cardiologist: Sinclair Grooms, MD  Chart reviewed as part of pre-operative protocol coverage. Given past medical history and time since last visit, based on ACC/AHA guidelines, Kathryn Castillo would be at acceptable risk for the planned procedure without further cardiovascular testing.   Her RCRI class II risk, 0.9% risk of major cardiac event.    If necessary her aspirin may be held for 5 to 7 days prior to her surgery.  Please resume as soon as hemostasis is achieved.  I will route this recommendation to the requesting party via Epic fax function and remove from pre-op pool.  Please call with questions.  Jossie Ng. Patrecia Veiga NP-C     05/09/2022, 8:41 AM [address] Office [phone] Fax [phone]

## 2022-05-12 DIAGNOSIS — M170 Bilateral primary osteoarthritis of knee: Secondary | ICD-10-CM | POA: Diagnosis not present

## 2022-07-21 IMAGING — CT CT ANGIO CHEST
2 of 8 series · 18 of 46 positions shown · IV contrast (OMNIPAQUE 350)
Comparison: None.

CLINICAL DATA: Chest pain, dyspnea,

EXAM:
CT ANGIOGRAPHY CHEST WITH CONTRAST
TECHNIQUE: Multidetector CT imaging of the chest was performed using the
standard protocol during bolus administration of intravenous
contrast. Multiplanar CT image reconstructions and MIPs were
obtained to evaluate the vascular anatomy.
CONTRAST:  80mL OMNIPAQUE IOHEXOL 350 MG/ML SOLN

[Series 5: thins · axial · 0.69mm/px · z∈[-288,-54]mm · 15 of 264 slices shown]
[im 15/264  lung]
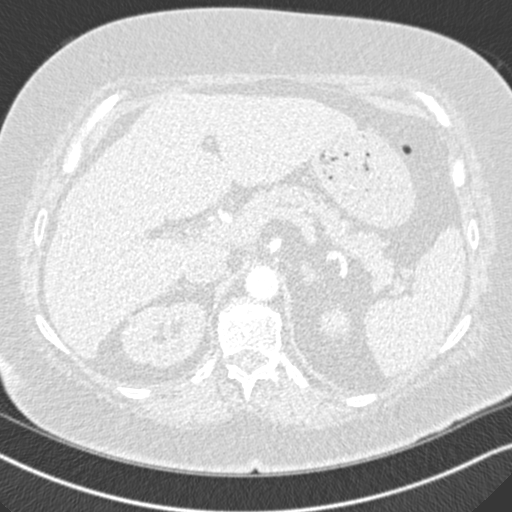
[im 30/264  soft-tissue]
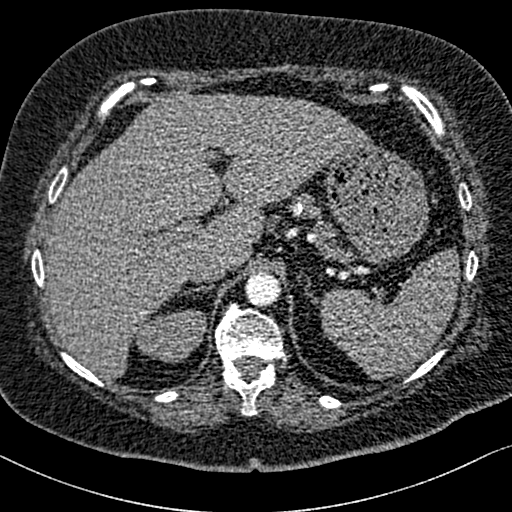
[im 44/264  lung]
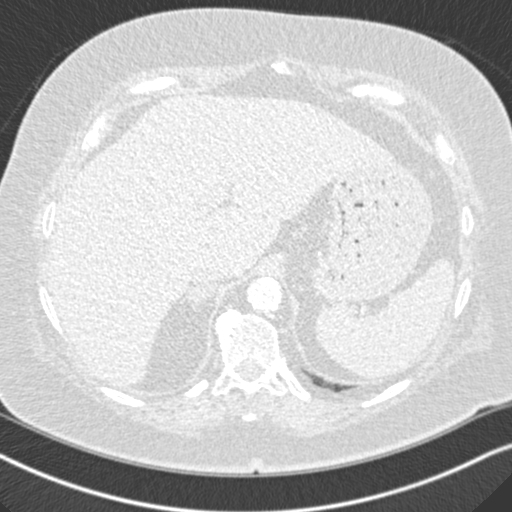
[im 59/264  soft-tissue]
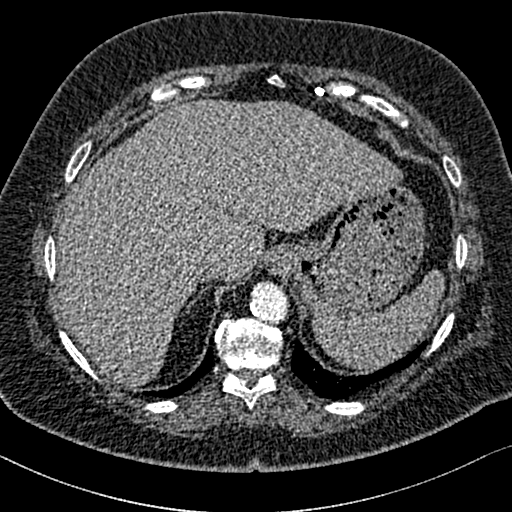
[im 88/264  lung]
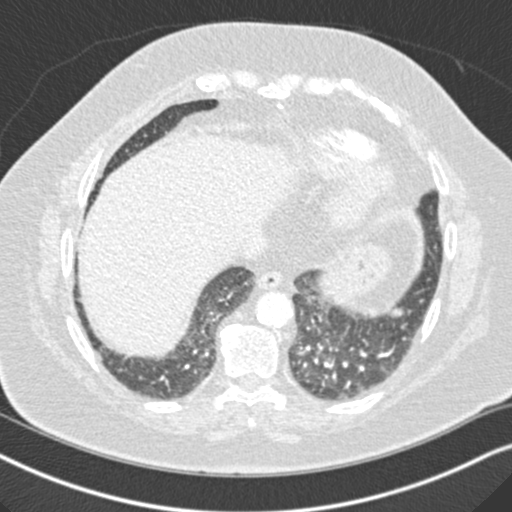
[im 103/264  soft-tissue]
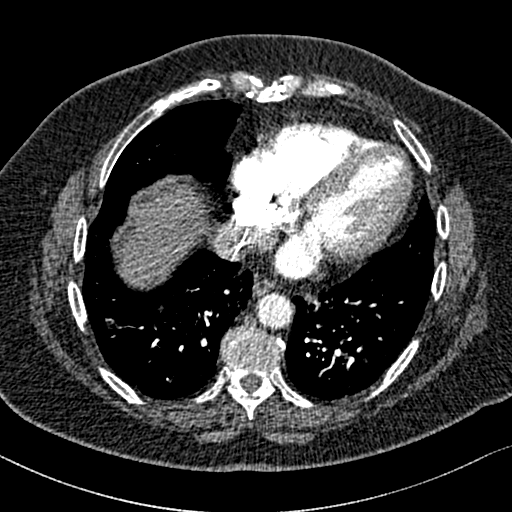
[im 117/264  lung]
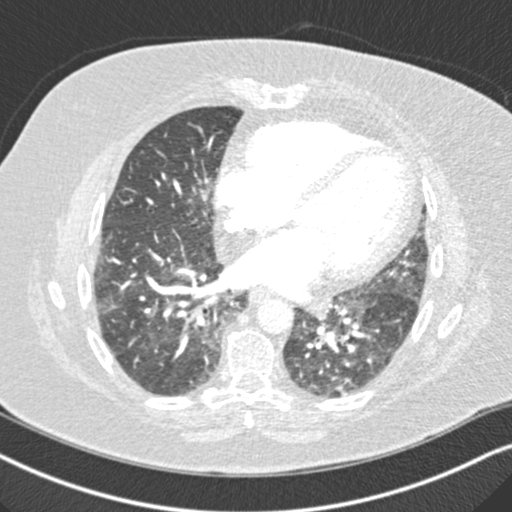
[im 132/264  soft-tissue]
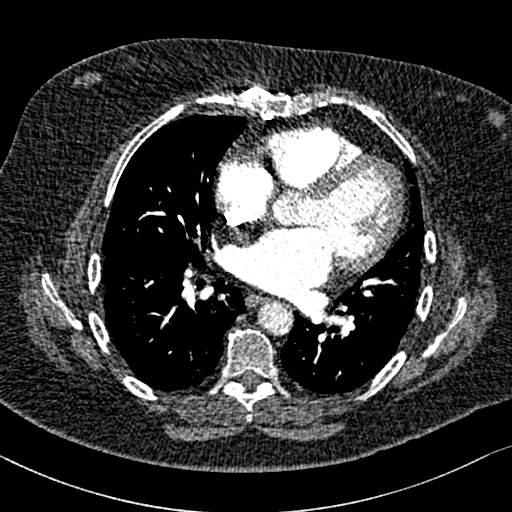
[im 147/264  lung]
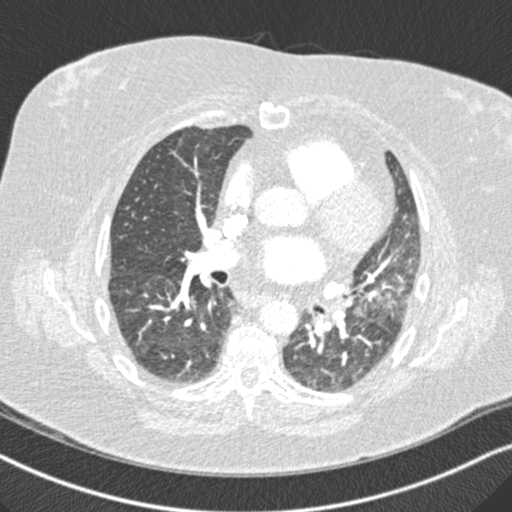
[im 161/264  soft-tissue]
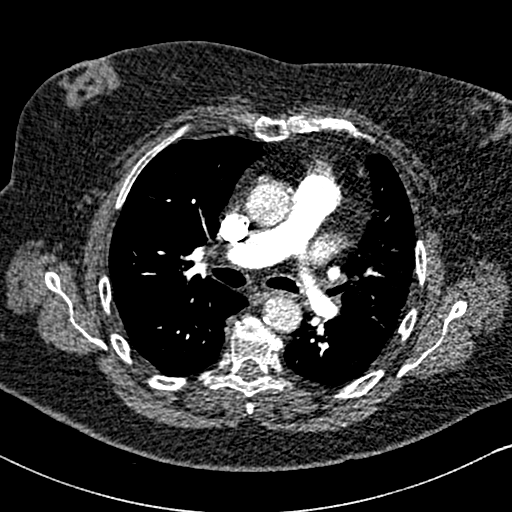
[im 176/264  lung]
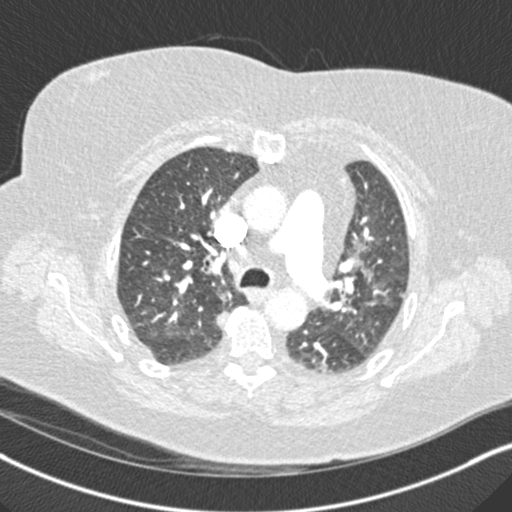
[im 205/264  soft-tissue]
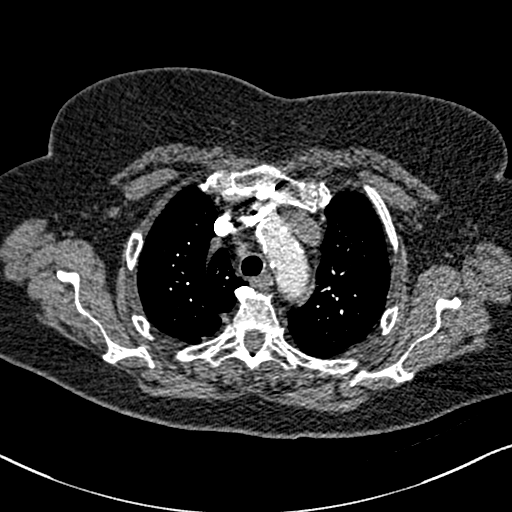
[im 220/264  lung]
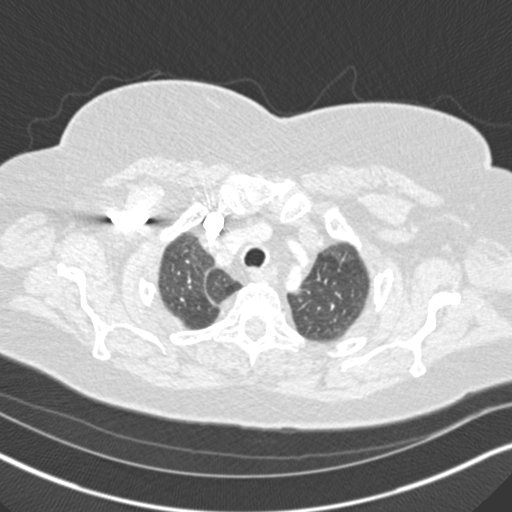
[im 234/264  soft-tissue]
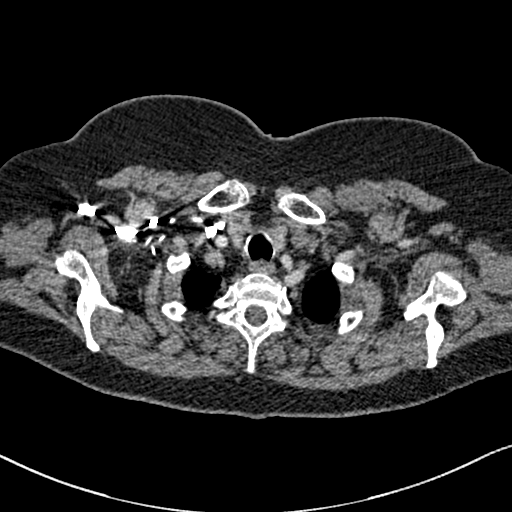
[im 249/264  lung]
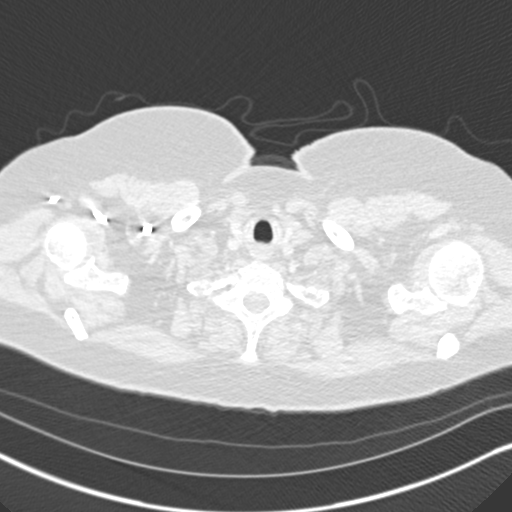

[Series 7: coronal mpr · coronal · 0.54mm/px · 3 of 127 slices shown]
[im 32/127  soft-tissue]
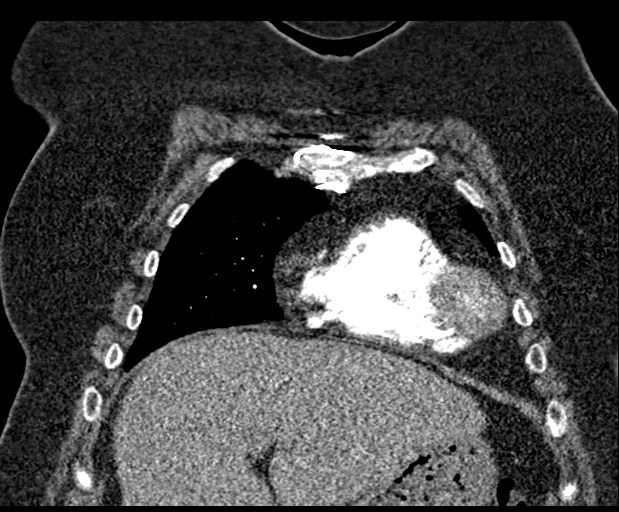
[im 64/127  soft-tissue]
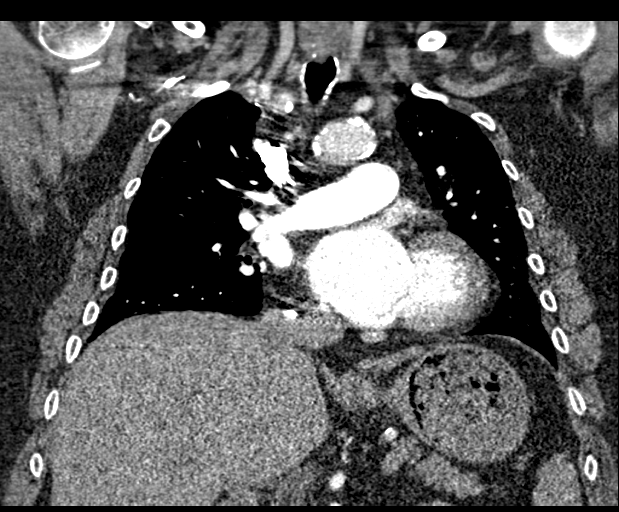
[im 95/127  soft-tissue]
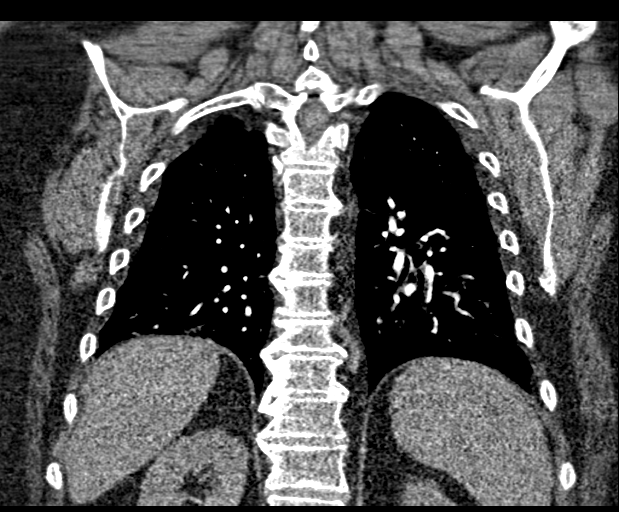

[18 of 46 positions shown; findings below may reference images not displayed]

FINDINGS: Cardiovascular: There is adequate opacification of the pulmonary
arterial tree. There is no intraluminal filling defect identified to
suggest acute pulmonary embolism. The central pulmonary arteries are
of normal caliber.

Cardiac size is mildly globally enlarged. Minimal coronary artery
calcification. No pericardial effusion. Moderate atherosclerotic
calcification is seen within the thoracic aorta. No aortic aneurysm

Mediastinum/Nodes: No pathologic adenopathy. Thyroid unremarkable.
Esophagus unremarkable.

Lungs/Pleura: 5 mm indeterminate noncalcified pulmonary nodule is
seen within the right middle lobe, axial image # 41/6. Mild
bilateral asymmetric ground-glass pulmonary infiltrate is seen best
appreciated within the left upper lobe and peripheral lung bases
bilaterally, likely infectious or inflammatory. The findings can be
seen in the setting of acute FPKIT-D9 pneumonia. No pneumothorax or
pleural effusion. The central airways are widely patent.

Upper Abdomen: No acute abnormality.

Musculoskeletal: Median sternotomy has been performed. Osseous
structures are otherwise age-appropriate. No acute bone abnormality.

Review of the MIP images confirms the above findings.
IMPRESSION: No pulmonary embolism.

Mild global cardiomegaly.

Multifocal pulmonary infiltrates, likely infectious or inflammatory
in the acute setting. Mild parenchymal involvement. Serologic
correlation for FPKIT-D9 pneumonia may be helpful for further
management.

Aortic Atherosclerosis (G9UPY-TH3.3).

## 2022-08-05 ENCOUNTER — Telehealth: Payer: Self-pay

## 2022-08-05 NOTE — Telephone Encounter (Signed)
**Note De-Identified Salena Ortlieb Obfuscation** We received a [place] [employer] form/MD page of a [employer] pt asst application. I have completed the form for #90 with 3 refills and have e-mailed it to Dr Devin Going nurse so she can obtain her signature, date it, and to fax back to [employer] at the fax number circled at the top of the page/form.  FYI: Former Dr Katrinka Blazing Pt who will be seeing Dr Shari Prows.

## 2022-08-06 NOTE — Telephone Encounter (Signed)
Form signed and dated by Dr. Shari Prows and faxed to the contact information on form.  Fax confirmation received.   Will make PA Nurse aware of completion.

## 2022-08-08 DIAGNOSIS — M170 Bilateral primary osteoarthritis of knee: Secondary | ICD-10-CM | POA: Diagnosis not present

## 2022-08-08 DIAGNOSIS — M25511 Pain in right shoulder: Secondary | ICD-10-CM | POA: Diagnosis not present

## 2022-10-03 DIAGNOSIS — D225 Melanocytic nevi of trunk: Secondary | ICD-10-CM | POA: Diagnosis not present

## 2022-10-03 DIAGNOSIS — D235 Other benign neoplasm of skin of trunk: Secondary | ICD-10-CM | POA: Diagnosis not present

## 2022-10-03 DIAGNOSIS — D2262 Melanocytic nevi of left upper limb, including shoulder: Secondary | ICD-10-CM | POA: Diagnosis not present

## 2022-10-03 DIAGNOSIS — L814 Other melanin hyperpigmentation: Secondary | ICD-10-CM | POA: Diagnosis not present

## 2022-10-03 DIAGNOSIS — Z85828 Personal history of other malignant neoplasm of skin: Secondary | ICD-10-CM | POA: Diagnosis not present

## 2022-10-03 DIAGNOSIS — D2361 Other benign neoplasm of skin of right upper limb, including shoulder: Secondary | ICD-10-CM | POA: Diagnosis not present

## 2022-11-07 DIAGNOSIS — M170 Bilateral primary osteoarthritis of knee: Secondary | ICD-10-CM | POA: Diagnosis not present

## 2022-11-12 DIAGNOSIS — M62838 Other muscle spasm: Secondary | ICD-10-CM | POA: Diagnosis not present

## 2022-11-12 DIAGNOSIS — R079 Chest pain, unspecified: Secondary | ICD-10-CM | POA: Diagnosis not present

## 2022-12-05 DIAGNOSIS — Z961 Presence of intraocular lens: Secondary | ICD-10-CM | POA: Diagnosis not present

## 2022-12-05 DIAGNOSIS — H3530 Unspecified macular degeneration: Secondary | ICD-10-CM | POA: Diagnosis not present

## 2022-12-05 DIAGNOSIS — H02834 Dermatochalasis of left upper eyelid: Secondary | ICD-10-CM | POA: Diagnosis not present

## 2022-12-05 DIAGNOSIS — H02831 Dermatochalasis of right upper eyelid: Secondary | ICD-10-CM | POA: Diagnosis not present

## 2022-12-05 DIAGNOSIS — H02423 Myogenic ptosis of bilateral eyelids: Secondary | ICD-10-CM | POA: Diagnosis not present

## 2022-12-09 DIAGNOSIS — R0989 Other specified symptoms and signs involving the circulatory and respiratory systems: Secondary | ICD-10-CM | POA: Diagnosis not present

## 2022-12-09 DIAGNOSIS — E7800 Pure hypercholesterolemia, unspecified: Secondary | ICD-10-CM | POA: Diagnosis not present

## 2022-12-09 DIAGNOSIS — I2510 Atherosclerotic heart disease of native coronary artery without angina pectoris: Secondary | ICD-10-CM | POA: Diagnosis not present

## 2022-12-09 DIAGNOSIS — E039 Hypothyroidism, unspecified: Secondary | ICD-10-CM | POA: Diagnosis not present

## 2022-12-09 DIAGNOSIS — E1169 Type 2 diabetes mellitus with other specified complication: Secondary | ICD-10-CM | POA: Diagnosis not present

## 2022-12-09 DIAGNOSIS — I10 Essential (primary) hypertension: Secondary | ICD-10-CM | POA: Diagnosis not present

## 2022-12-09 DIAGNOSIS — Z0000 Encounter for general adult medical examination without abnormal findings: Secondary | ICD-10-CM | POA: Diagnosis not present

## 2022-12-09 DIAGNOSIS — I771 Stricture of artery: Secondary | ICD-10-CM | POA: Diagnosis not present

## 2022-12-09 DIAGNOSIS — Z23 Encounter for immunization: Secondary | ICD-10-CM | POA: Diagnosis not present

## 2022-12-09 DIAGNOSIS — G4733 Obstructive sleep apnea (adult) (pediatric): Secondary | ICD-10-CM | POA: Diagnosis not present

## 2022-12-09 DIAGNOSIS — E119 Type 2 diabetes mellitus without complications: Secondary | ICD-10-CM | POA: Diagnosis not present

## 2023-01-30 DIAGNOSIS — M25511 Pain in right shoulder: Secondary | ICD-10-CM | POA: Diagnosis not present

## 2023-01-30 DIAGNOSIS — M170 Bilateral primary osteoarthritis of knee: Secondary | ICD-10-CM | POA: Diagnosis not present

## 2023-02-02 ENCOUNTER — Other Ambulatory Visit: Payer: Self-pay | Admitting: Internal Medicine

## 2023-02-02 DIAGNOSIS — Z1231 Encounter for screening mammogram for malignant neoplasm of breast: Secondary | ICD-10-CM

## 2023-02-24 DIAGNOSIS — H02413 Mechanical ptosis of bilateral eyelids: Secondary | ICD-10-CM | POA: Diagnosis not present

## 2023-02-24 DIAGNOSIS — H02422 Myogenic ptosis of left eyelid: Secondary | ICD-10-CM | POA: Diagnosis not present

## 2023-02-24 DIAGNOSIS — H02412 Mechanical ptosis of left eyelid: Secondary | ICD-10-CM | POA: Diagnosis not present

## 2023-02-24 DIAGNOSIS — H0279 Other degenerative disorders of eyelid and periocular area: Secondary | ICD-10-CM | POA: Diagnosis not present

## 2023-02-24 DIAGNOSIS — H02834 Dermatochalasis of left upper eyelid: Secondary | ICD-10-CM | POA: Diagnosis not present

## 2023-02-24 DIAGNOSIS — H53483 Generalized contraction of visual field, bilateral: Secondary | ICD-10-CM | POA: Diagnosis not present

## 2023-02-24 DIAGNOSIS — H02831 Dermatochalasis of right upper eyelid: Secondary | ICD-10-CM | POA: Diagnosis not present

## 2023-02-24 DIAGNOSIS — H57813 Brow ptosis, bilateral: Secondary | ICD-10-CM | POA: Diagnosis not present

## 2023-02-24 DIAGNOSIS — H0289 Other specified disorders of eyelid: Secondary | ICD-10-CM | POA: Diagnosis not present

## 2023-02-24 DIAGNOSIS — H02421 Myogenic ptosis of right eyelid: Secondary | ICD-10-CM | POA: Diagnosis not present

## 2023-02-24 DIAGNOSIS — H02423 Myogenic ptosis of bilateral eyelids: Secondary | ICD-10-CM | POA: Diagnosis not present

## 2023-02-24 DIAGNOSIS — H02411 Mechanical ptosis of right eyelid: Secondary | ICD-10-CM | POA: Diagnosis not present

## 2023-03-06 ENCOUNTER — Ambulatory Visit: Payer: PPO

## 2023-04-03 DIAGNOSIS — G4710 Hypersomnia, unspecified: Secondary | ICD-10-CM | POA: Diagnosis not present

## 2023-04-03 DIAGNOSIS — G4733 Obstructive sleep apnea (adult) (pediatric): Secondary | ICD-10-CM | POA: Diagnosis not present

## 2023-04-03 DIAGNOSIS — I10 Essential (primary) hypertension: Secondary | ICD-10-CM | POA: Diagnosis not present

## 2023-04-07 ENCOUNTER — Ambulatory Visit
Admission: RE | Admit: 2023-04-07 | Discharge: 2023-04-07 | Disposition: A | Discharge status: Home / Self Care | Payer: PPO | Source: Ambulatory Visit | Attending: Internal Medicine | Admitting: Internal Medicine

## 2023-04-07 DIAGNOSIS — Z1231 Encounter for screening mammogram for malignant neoplasm of breast: Secondary | ICD-10-CM

## 2023-04-13 DIAGNOSIS — H906 Mixed conductive and sensorineural hearing loss, bilateral: Secondary | ICD-10-CM | POA: Diagnosis not present

## 2023-04-13 DIAGNOSIS — H6993 Unspecified Eustachian tube disorder, bilateral: Secondary | ICD-10-CM | POA: Diagnosis not present

## 2023-05-06 NOTE — Progress Notes (Signed)
Cardiology Office Note:  .   Date:  05/07/2023  ID:  Kathryn Castillo, DOB 1951/03/05, MRN [id number] PCP: Renford Dills, MD   [facility]Care Providers Cardiologist:  Lesleigh Noe, MD (Inactive) Sleep Medicine:  Armanda Magic, MD { History of Present Illness: Kathryn Castillo is a 73 y.o. female with history of CAD s/p CABG, subclavian stenosis, HTN, HLD who presents for follow-up.    History of Present Illness   Miss Kathryn Castillo, a 74 year old female with a history of CAD status post CABG, hypertension, hyperlipidemia, presents for routine follow-up. She reports no changes in her medications and denies any chest pains or trouble breathing. She is active but does not engage in formal exercise. She has a history of borderline diabetes managed with metformin and Ozempic. She is on levothyroxine for thyroid management. She has a family history of heart disease, with her father having had the condition. She is a former smoker and does not consume alcohol. She is single with one child and two grandchildren. She has had minor surgeries in the past, but no major ones apart from the bypass surgery. She reports occasional swelling in her legs, which she manages with Lasix as needed. No CP or SOB.          Problem List CAD s/p CABG -LIMA-LAD 1996 -LHC patent LIMA-LAD (occluded pLAD), normal LCX/RCA 2. R subclavian stenosis  3. HLD -T chol 120, HDL 35, LDL 25, TG 428 4. HTN 5. OSA    ROS: All other ROS reviewed and negative. Pertinent positives noted in the HPI.     Studies Reviewed: Marland Kitchen   EKG Interpretation Date/Time:  Thursday May 07 2023 15:33:01 EST Ventricular Rate:  69 PR Interval:  212 QRS Duration:  90 QT Interval:  396 QTC Calculation: 424 R Axis:   5  Text Interpretation: Sinus rhythm with 1st degree A-V block RSR' or QR pattern in V1 suggests right ventricular conduction delay Confirmed by Lennie Odor [phone]) on 05/07/2023 3:40:26 PM    TTE 05/17/2021   1. Left ventricular ejection fraction, by estimation, is 60 to 65%. The  left ventricle has normal function. The left ventricle has no regional  wall motion abnormalities. Left ventricular diastolic parameters were  normal.   2. Right ventricular systolic function is normal. The right ventricular  size is normal.   3. Left atrial size was moderately dilated.   4. The mitral valve is abnormal. Mild mitral valve regurgitation. No  evidence of mitral stenosis.   5. The aortic valve is tricuspid. There is mild calcification of the  aortic valve. Aortic valve regurgitation is not visualized. Aortic valve  sclerosis is present, with no evidence of aortic valve stenosis.   6. The inferior vena cava is normal in size with greater than 50%  respiratory variability, suggesting right atrial pressure of 3 mmHg.  Physical Exam:   VS:  BP 126/72 (BP Location: Left Arm, Patient Position: Sitting, Cuff Size: Large)   Pulse 69   Ht 5\' 3"  (1.6 m)   Wt 227 lb (103 kg)   BMI 40.21 kg/m    Wt Readings from Last 3 Encounters:  05/07/23 227 lb (103 kg)  04/03/22 235 lb (106.6 kg)  06/14/21 252 lb 3.2 oz (114.4 kg)    GEN: Well nourished, well developed in no acute distress NECK: No JVD; No carotid bruits CARDIAC: RRR, no murmurs, rubs, gallops RESPIRATORY:  Clear to auscultation without rales, wheezing or rhonchi  ABDOMEN: Soft,  non-tender, non-distended EXTREMITIES:  No edema; No deformity  ASSESSMENT AND PLAN: .   Assessment and Plan    Coronary Artery Disease (CAD) status post Coronary Artery Bypass Graft (CABG) Asymptomatic with normal EKG findings. -Continue current management and follow-up in one year. -continue ASA and crestor.   Hyperlipidemia Elevated triglycerides (428) on recent labs, but patient unsure if she was fasting. Currently on Crestor 40mg  daily. -Return for fasting lipids to accurately assess lipid profile. -Consider addition of Vascepa depending on  results.  Hypertension Blood pressure well-controlled at 126/72. -Continue current management.  General Health Maintenance -Continue daily Aspirin. -Return for labs when fasting.              Follow-up: Return in about 1 year (around 05/06/2024).   Signed, Lenna Gilford. Flora Lipps, MD, [facility]  [facility]  [address] [place], [place] [address] [phone]  3:53 PM

## 2023-05-07 ENCOUNTER — Ambulatory Visit: Payer: PPO | Attending: Cardiovascular Disease | Admitting: Cardiovascular Disease

## 2023-05-07 ENCOUNTER — Encounter: Payer: Self-pay | Admitting: Cardiovascular Disease

## 2023-05-07 VITALS — BP 126/72 | HR 69 | Ht 63.0 in | Wt 227.0 lb

## 2023-05-07 DIAGNOSIS — E782 Mixed hyperlipidemia: Secondary | ICD-10-CM

## 2023-05-07 DIAGNOSIS — I25810 Atherosclerosis of coronary artery bypass graft(s) without angina pectoris: Secondary | ICD-10-CM

## 2023-05-07 DIAGNOSIS — I150 Renovascular hypertension: Secondary | ICD-10-CM

## 2023-05-07 NOTE — Patient Instructions (Signed)
Medication Instructions:  Your physician recommends that you continue on your current medications as directed. Please refer to the Current Medication list given to you today.  *If you need a refill on your cardiac medications before your next appointment, please call your pharmacy*   Lab Work: FASTING Lipid panel and direct LDL If you have labs (blood work) drawn today and your tests are completely normal, you will receive your results only by: MyChart Message (if you have MyChart) OR A paper copy in the mail If you have any lab test that is abnormal or we need to change your treatment, we will call you to review the results.    Follow-Up: At [facility], you and your health needs are our priority.  As part of our continuing mission to provide you with exceptional heart care, we have created designated Provider Care Teams.  These Care Teams include your primary Cardiologist (physician) and Advanced Practice Providers (APPs -  Physician Assistants and Nurse Practitioners) who all work together to provide you with the care you need, when you need it.  We recommend signing up for the patient portal called "MyChart".  Sign up information is provided on this After Visit Summary.  MyChart is used to connect with patients for Virtual Visits (Telemedicine).  Patients are able to view lab/test results, encounter notes, upcoming appointments, etc.  Non-urgent messages can be sent to your provider as well.   To learn more about what you can do with MyChart, go to [email or web address].    Your next appointment:   12 month(s)  Provider:   Theodore Demark, PA-C, Edd Fabian, FNP, Micah Flesher, PA-C, Marjie Skiff, PA-C, Robet Leu, PA-C, Juanda Crumble, PA-C, Joni Reining, DNP, ANP, Azalee Course, PA-C, Bernadene Person, NP, or Reather Littler, NP

## 2023-05-12 DIAGNOSIS — E782 Mixed hyperlipidemia: Secondary | ICD-10-CM | POA: Diagnosis not present

## 2023-05-12 LAB — LIPID PANEL
Chol/HDL Ratio: 3.1 {ratio} (ref 0.0–4.4)
Cholesterol, Total: 114 mg/dL (ref 100–199)
HDL: 37 mg/dL — ABNORMAL LOW (ref >39)
LDL Chol Calc (NIH): 45 mg/dL (ref 0–99)
Triglycerides: 194 mg/dL — ABNORMAL HIGH (ref 0–149)
VLDL Cholesterol Cal: 32 mg/dL (ref 5–40)

## 2023-05-12 LAB — LDL CHOLESTEROL, DIRECT: LDL Direct: 43 mg/dL (ref 0–99)

## 2023-05-13 ENCOUNTER — Encounter: Payer: Self-pay | Admitting: Cardiovascular Disease

## 2023-06-05 ENCOUNTER — Telehealth: Payer: Self-pay | Admitting: *Deleted

## 2023-06-05 NOTE — Telephone Encounter (Signed)
Dr. Flora Lipps  We have received a surgical clearance request for Kathryn Castillo for total knee replacement. They were seen recently in clinic on 05/07/2023. Can you please comment on surgical clearance. Please forward you guidance and recommendations to [facility] PREOP   Thanks, Robin Searing, NP

## 2023-06-05 NOTE — Telephone Encounter (Signed)
   Pre-operative Risk Assessment    Patient Name: Kathryn Castillo  DOB: [birth year] MRN: [id number]   Date of last office visit: 05/07/23 DR. O'NEAL Date of next office visit: NONE   Request for Surgical Clearance    Procedure:  RIGHT TOTAL KNEE REPLACEMENT  Date of Surgery:  Clearance 06/30/23                                Surgeon:  DR. Teryl Lucy Surgeon's Group or Practice Name:  [facility] Phone number:  [phone] EXT [address] Fax number:  [phone]   Type of Clearance Requested:   - Medical  - Pharmacy:  Hold Aspirin     Type of Anesthesia:  Spinal   Additional requests/questions:    Elpidio Anis   06/05/2023, 4:34 PM

## 2023-06-08 NOTE — Telephone Encounter (Signed)
   Name: Kathryn Castillo [facility]  DOB: 09-05-1950  MRN: [id number]   Primary Cardiologist: Lesleigh Noe, MD (Inactive)  Chart reviewed as part of pre-operative protocol coverage. Kathryn Castillo was last seen on 05/07/2023 by Dr. Flora Lipps.  Per Dr. Flora Lipps "OK to hold aspirin and ok to proceed to surgery without further testing."  Therefore, based on ACC/AHA guidelines, the patient would be an acceptable risk for the planned procedure without further cardiovascular testing.   Per office protocol, he may hold aspirin for 7 days prior to procedure and should resume as soon as hemodynamically stable postoperatively.   I will route this recommendation to the requesting party via Epic fax function and remove from pre-op pool. Please call with questions.  Carlos Levering, NP 06/08/2023, 1:34 PM

## 2023-06-18 ENCOUNTER — Encounter (HOSPITAL_COMMUNITY): Payer: PPO

## 2023-06-22 DIAGNOSIS — M1711 Unilateral primary osteoarthritis, right knee: Secondary | ICD-10-CM | POA: Diagnosis not present

## 2023-06-22 NOTE — Progress Notes (Signed)
 COVID Vaccine received:  []  No []  Yes Date of any COVID positive Test in last 90 days:  PCP - Renford Dills MD Cardiologist - Dr. Flora Lipps  Chest x-ray -  EKG -  05/07/23 Epic Stress Test -  ECHO - 05/17/21 Epic Cardiac Cath - 08/23/15 Epic  Cardiac Clearance 06/08/23 Carlos Levering NP  Bowel Prep - [x]  No  []   Yes ______  Pacemaker / ICD device []  No []  Yes   Spinal Cord Stimulator:[]  No []  Yes       History of Sleep Apnea? []  No []  Yes   CPAP used?- []  No []  Yes    Does the patient monitor blood sugar?          []  No []  Yes  []  N/A  Patient has: []  NO Hx DM   []  Pre-DM                 []  DM1  []   DM2 Does patient have a [employer] or [employer]om? []  No []  Yes   Fasting Blood Sugar Ranges-  Checks Blood Sugar _____ times a day  GLP1 agonist / usual dose -  GLP1 instructions:  SGLT-2 inhibitors / usual dose -  SGLT-2 instructions:   Blood Thinner / Instructions: Aspirin Instructions:  Comments:   Activity level: Patient is able / unable to climb a flight of stairs without difficulty; []  No CP  []  No SOB, but would have ___   Patient can / can not perform ADLs without assistance.   Anesthesia review:   Patient denies shortness of breath, fever, cough and chest pain at PAT appointment.  Patient verbalized understanding and agreement to the Pre-Surgical Instructions that were given to them at this PAT appointment. Patient was also educated of the need to review these PAT instructions again prior to his/her surgery.I reviewed the appropriate phone numbers to call if they have any and questions or concerns.

## 2023-06-22 NOTE — Patient Instructions (Signed)
 SURGICAL WAITING ROOM VISITATION  Patients having surgery or a procedure may have no more than 2 support people in the waiting area - these visitors may rotate.    Children under the age of 38 must have an adult with them who is not the patient.  Due to an increase in RSV and influenza rates and associated hospitalizations, children ages 84 and under may not visit patients in [facility] hospitals.  Visitors with respiratory illnesses are discouraged from visiting and should remain at home.  If the patient needs to stay at the hospital during part of their recovery, the visitor guidelines for inpatient rooms apply. Pre-op nurse will coordinate an appropriate time for 1 support person to accompany patient in pre-op.  This support person may not rotate.    Please refer to the [facility] website for the visitor guidelines for Inpatients (after your surgery is over and you are in a regular room).       Your procedure is scheduled on: 06/30/23   Report to [facility] Main Entrance    Report to admitting at 7 AM   Call this number if you have problems the morning of surgery [phone]   Do not eat food :After Midnight.   After Midnight you may have the following liquids until 6:30 AM DAY OF SURGERY  Water Non-Citrus Juices (without pulp, NO RED-Apple, White grape, White cranberry) Black Coffee (NO MILK/CREAM OR CREAMERS, sugar ok)  Clear Tea (NO MILK/CREAM OR CREAMERS, sugar ok) regular and decaf                             Plain Jell-O (NO RED)                                           Fruit ices (not with fruit pulp, NO RED)                                     Popsicles (NO RED)                                                               Sports drinks like Gatorade (NO RED)              Drink 2 Ensure/G2 drinks AT    The day of surgery:  Drink ONE (1) Pre-Surgery Clear Ensure at 6:30 AM the morning of surgery. Drink in one sitting. Do not sip.  This drink was given  to you during your hospital  pre-op appointment visit. Nothing else to drink after completing the  Pre-Surgery Clear Ensure .       Oral Hygiene is also important to reduce your risk of infection.                                    Remember - BRUSH YOUR TEETH THE MORNING OF SURGERY WITH YOUR REGULAR TOOTHPASTE  DENTURES WILL BE REMOVED PRIOR TO SURGERY PLEASE DO NOT APPLY "Poly  grip" OR ADHESIVES!!!   Do NOT smoke after Midnight   Stop all vitamins and herbal supplements 7 days before surgery.   Take these medicines the morning of surgery with A SIP OF WATER: Atenolol, Levothyroxine, Rosuvastatin Do not take any diabetic meds the day of surgery. Hold Metformin the morning of surgery. Hold Farxiga for 72 hours prior to surgery. Last dose should be March 7th. Hold Ozempic for 7 days prior to surgery.  Bring CPAP mask and tubing day of surgery.                              You may not have any metal on your body including hair pins, jewelry, and body piercing             Do not wear make-up, lotions, powders, perfumes/cologne, or deodorant  Do not wear nail polish including gel and S&S, artificial/acrylic nails, or any other type of covering on natural nails including finger and toenails. If you have artificial nails, gel coating, etc. that needs to be removed by a nail salon please have this removed prior to surgery or surgery may need to be canceled/ delayed if the surgeon/ anesthesia feels like they are unable to be safely monitored.   Do not shave  48 hours prior to surgery.               Men may shave face and neck.   Do not bring valuables to the hospital. [facility] IS NOT             RESPONSIBLE   FOR VALUABLES.   Contacts, glasses, dentures or bridgework may not be worn into surgery.  DO NOT BRING YOUR HOME MEDICATIONS TO THE HOSPITAL. PHARMACY WILL DISPENSE MEDICATIONS LISTED ON YOUR MEDICATION LIST TO YOU DURING YOUR ADMISSION IN THE HOSPITAL!    Patients discharged  on the day of surgery will not be allowed to drive home.  Someone NEEDS to stay with you for the first 24 hours after anesthesia.   Special Instructions: Bring a copy of your healthcare power of attorney and living will documents the day of surgery if you haven't scanned them before.              Please read over the following fact sheets you were given: IF YOU HAVE QUESTIONS ABOUT YOUR PRE-OP INSTRUCTIONS PLEASE CALL [phone] [facility]   If you received a COVID test during your pre-op visit  it is requested that you wear a mask when out in public, stay away from anyone that may not be feeling well and notify your surgeon if you develop symptoms. If you test positive for Covid or have been in contact with anyone that has tested positive in the last 10 days please notify you surgeon.      Pre-operative 5 CHG Bath Instructions   You can play a key role in reducing the risk of infection after surgery. Your skin needs to be as free of germs as possible. You can reduce the number of germs on your skin by washing with CHG (chlorhexidine gluconate) soap before surgery. CHG is an antiseptic soap that kills germs and continues to kill germs even after washing.   DO NOT use if you have an allergy to chlorhexidine/CHG or antibacterial soaps. If your skin becomes reddened or irritated, stop using the CHG and notify one of our RNs at [phone].   Please shower with the CHG soap  starting 4 days before surgery using the following schedule:     Please keep in mind the following:  DO NOT shave, including legs and underarms, starting the day of your first shower.   You may shave your face at any point before/day of surgery.  Place clean sheets on your bed the day you start using CHG soap. Use a clean washcloth (not used since being washed) for each shower. DO NOT sleep with pets once you start using the CHG.   CHG Shower Instructions:  If you choose to wash your hair and private area, wash first with  your normal shampoo/soap.  After you use shampoo/soap, rinse your hair and body thoroughly to remove shampoo/soap residue.  Turn the water OFF and apply about 3 tablespoons (45 ml) of CHG soap to a CLEAN washcloth.  Apply CHG soap ONLY FROM YOUR NECK DOWN TO YOUR TOES (washing for 3-5 minutes)  DO NOT use CHG soap on face, private areas, open wounds, or sores.  Pay special attention to the area where your surgery is being performed.  If you are having back surgery, having someone wash your back for you may be helpful. Wait 2 minutes after CHG soap is applied, then you may rinse off the CHG soap.  Pat dry with a clean towel  Put on clean clothes/pajamas   If you choose to wear lotion, please use ONLY the CHG-compatible lotions on the back of this paper.     Additional instructions for the day of surgery: DO NOT APPLY any lotions, deodorants, cologne, or perfumes.   Put on clean/comfortable clothes.  Brush your teeth.  Ask your nurse before applying any prescription medications to the skin.      CHG Compatible Lotions   Aveeno Moisturizing lotion  Cetaphil Moisturizing Cream  Cetaphil Moisturizing Lotion  Clairol Herbal Essence Moisturizing Lotion, Dry Skin  Clairol Herbal Essence Moisturizing Lotion, Extra Dry Skin  Clairol Herbal Essence Moisturizing Lotion, Normal Skin  Curel Age Defying Therapeutic Moisturizing Lotion with Alpha Hydroxy  Curel Extreme Care Body Lotion  Curel Soothing Hands Moisturizing Hand Lotion  Curel Therapeutic Moisturizing Cream, Fragrance-Free  Curel Therapeutic Moisturizing Lotion, Fragrance-Free  Curel Therapeutic Moisturizing Lotion, Original Formula  Eucerin Daily Replenishing Lotion  Eucerin Dry Skin Therapy Plus Alpha Hydroxy Crme  Eucerin Dry Skin Therapy Plus Alpha Hydroxy Lotion  Eucerin Original Crme  Eucerin Original Lotion  Eucerin Plus Crme Eucerin Plus Lotion  Eucerin TriLipid Replenishing Lotion  Keri Anti-Bacterial Hand Lotion   Keri Deep Conditioning Original Lotion Dry Skin Formula Softly Scented  Keri Deep Conditioning Original Lotion, Fragrance Free Sensitive Skin Formula  Keri Lotion Fast Absorbing Fragrance Free Sensitive Skin Formula  Keri Lotion Fast Absorbing Softly Scented Dry Skin Formula  Keri Original Lotion  Keri Skin Renewal Lotion Keri Silky Smooth Lotion  Keri Silky Smooth Sensitive Skin Lotion  Nivea Body Creamy Conditioning Oil  Nivea Body Extra Enriched Lotion  Nivea Body Original Lotion  Nivea Body Sheer Moisturizing Lotion Nivea Crme  Nivea Skin Firming Lotion  NutraDerm 30 Skin Lotion  NutraDerm Skin Lotion  NutraDerm Therapeutic Skin Cream  NutraDerm Therapeutic Skin Lotion  ProShield Protective Hand Cream   Incentive Spirometer (Watch this video at home: [email or web address])  An incentive spirometer is a tool that can help keep your lungs clear and active. This tool measures how well you are filling your lungs with each breath. Taking long deep breaths may help reverse or decrease the chance of developing breathing (pulmonary) problems (especially  infection) following: A long period of time when you are unable to move or be active. BEFORE THE PROCEDURE  If the spirometer includes an indicator to show your best effort, your nurse or respiratory therapist will set it to a desired goal. If possible, sit up straight or lean slightly forward. Try not to slouch. Hold the incentive spirometer in an upright position. INSTRUCTIONS FOR USE  Sit on the edge of your bed if possible, or sit up as far as you can in bed or on a chair. Hold the incentive spirometer in an upright position. Breathe out normally. Place the mouthpiece in your mouth and seal your lips tightly around it. Breathe in slowly and as deeply as possible, raising the piston or the ball toward the top of the column. Hold your breath for 3-5 seconds or for as long as possible. Allow the piston or ball to  fall to the bottom of the column. Remove the mouthpiece from your mouth and breathe out normally. Rest for a few seconds and repeat Steps 1 through 7 at least 10 times every 1-2 hours when you are awake. Take your time and take a few normal breaths between deep breaths. The spirometer may include an indicator to show your best effort. Use the indicator as a goal to work toward during each repetition. After each set of 10 deep breaths, practice coughing to be sure your lungs are clear. If you have an incision (the cut made at the time of surgery), support your incision when coughing by placing a pillow or rolled up towels firmly against it. Once you are able to get out of bed, walk around indoors and cough well. You may stop using the incentive spirometer when instructed by your caregiver.  RISKS AND COMPLICATIONS Take your time so you do not get dizzy or light-headed. If you are in pain, you may need to take or ask for pain medication before doing incentive spirometry. It is harder to take a deep breath if you are having pain. AFTER USE Rest and breathe slowly and easily. It can be helpful to keep track of a log of your progress. Your caregiver can provide you with a simple table to help with this. If you are using the spirometer at home, follow these instructions: SEEK MEDICAL CARE IF:  You are having difficultly using the spirometer. You have trouble using the spirometer as often as instructed. Your pain medication is not giving enough relief while using the spirometer. You develop fever of 100.5 F (38.1 C) or higher. SEEK IMMEDIATE MEDICAL CARE IF:  You cough up bloody sputum that had not been present before. You develop fever of 102 F (38.9 C) or greater. You develop worsening pain at or near the incision site. MAKE SURE YOU:  Understand these instructions. Will watch your condition. Will get help right away if you are not doing well or get worse. Document Released: 08/18/2006  Document Revised: 06/30/2011 Document Reviewed: 10/19/2006 [facility] Patient Information 2014 [place], [place].

## 2023-06-23 ENCOUNTER — Encounter (HOSPITAL_COMMUNITY)
Admission: RE | Admit: 2023-06-23 | Discharge: 2023-06-23 | Disposition: A | Discharge status: Home / Self Care | Payer: PPO | Source: Ambulatory Visit | Attending: Anesthesiology | Admitting: Anesthesiology

## 2023-06-23 DIAGNOSIS — I10 Essential (primary) hypertension: Secondary | ICD-10-CM

## 2023-06-23 DIAGNOSIS — Z01818 Encounter for other preprocedural examination: Secondary | ICD-10-CM

## 2023-07-22 DIAGNOSIS — I10 Essential (primary) hypertension: Secondary | ICD-10-CM | POA: Diagnosis not present

## 2023-07-22 DIAGNOSIS — G4733 Obstructive sleep apnea (adult) (pediatric): Secondary | ICD-10-CM | POA: Diagnosis not present

## 2023-07-22 DIAGNOSIS — G4710 Hypersomnia, unspecified: Secondary | ICD-10-CM | POA: Diagnosis not present

## 2023-07-23 ENCOUNTER — Telehealth: Payer: Self-pay | Admitting: *Deleted

## 2023-07-23 NOTE — Telephone Encounter (Signed)
 Pt has tele appt 08/03/23. Med rec and consent are done.

## 2023-07-23 NOTE — Telephone Encounter (Signed)
   Name: Kathryn Castillo [facility]  DOB: 23-May-1950  MRN: [id number]  Primary Cardiologist: Lesleigh Noe, MD (Inactive)   Preoperative team, please contact this patient and set up a phone call appointment for further preoperative risk assessment. Please obtain consent and complete medication review. Thank you for your help.  I confirm that guidance regarding antiplatelet and oral anticoagulation therapy has been completed and, if necessary, noted below.  Per Dr. Flora Lipps, ok to hold ASA.  I also confirmed the patient resides in the state of [place]. As per [facility] Medical Board telemedicine laws, the patient must reside in the state in which the provider is licensed.   Marcelino Duster, PA 07/23/2023, 10:43 AM [facility]

## 2023-07-23 NOTE — Telephone Encounter (Signed)
   Pre-operative Risk Assessment    Patient Name: Kathryn Castillo  DOB: Feb 14, 1951 MRN: [id number]   Date of last office visit: 05/07/2023 Date of next office visit: None  Request for Surgical Clearance    Procedure:   Bilateral upper eyelid biepharoptosis repair CC Bilateral upper eyelid blepharoplasty  Date of Surgery:  Clearance 08/10/23                                 Surgeon:  Dr. Shawna Orleans Surgeon's Group or Practice Name:  [facility] Phone number:  [phone] Fax number:  [phone]   Type of Clearance Requested:   - Medical  - Pharmacy:  Hold Aspirin Not Indicated   Type of Anesthesia:  Not Indicated   Additional requests/questions:    Signed, Emmit Pomfret   07/23/2023, 10:13 AM

## 2023-07-23 NOTE — Telephone Encounter (Signed)
 Pt has tele appt 08/03/23. Med rec and consent are done.     Patient Consent for Virtual Visit        Kathryn Castillo has provided verbal consent on 07/23/2023 for a virtual visit (video or telephone).   CONSENT FOR VIRTUAL VISIT FOR:  Kathryn Castillo  By participating in this virtual visit I agree to the following:  I hereby voluntarily request, consent and authorize [facility] and its employed or contracted physicians, physician assistants, nurse practitioners or other licensed health care professionals (the Practitioner), to provide me with telemedicine health care services (the "Services") as deemed necessary by the treating Practitioner. I acknowledge and consent to receive the Services by the Practitioner via telemedicine. I understand that the telemedicine visit will involve communicating with the Practitioner through live audiovisual communication technology and the disclosure of certain medical information by electronic transmission. I acknowledge that I have been given the opportunity to request an in-person assessment or other available alternative prior to the telemedicine visit and am voluntarily participating in the telemedicine visit.  I understand that I have the right to withhold or withdraw my consent to the use of telemedicine in the course of my care at any time, without affecting my right to future care or treatment, and that the Practitioner or I may terminate the telemedicine visit at any time. I understand that I have the right to inspect all information obtained and/or recorded in the course of the telemedicine visit and may receive copies of available information for a reasonable fee.  I understand that some of the potential risks of receiving the Services via telemedicine include:  Delay or interruption in medical evaluation due to technological equipment failure or disruption; Information transmitted may not be sufficient (e.g. poor resolution of  images) to allow for appropriate medical decision making by the Practitioner; and/or  In rare instances, security protocols could fail, causing a breach of personal health information.  Furthermore, I acknowledge that it is my responsibility to provide information about my medical history, conditions and care that is complete and accurate to the best of my ability. I acknowledge that Practitioner's advice, recommendations, and/or decision may be based on factors not within their control, such as incomplete or inaccurate data provided by me or distortions of diagnostic images or specimens that may result from electronic transmissions. I understand that the practice of medicine is not an exact science and that Practitioner makes no warranties or guarantees regarding treatment outcomes. I acknowledge that a copy of this consent can be made available to me via my patient portal [facility] MyChart), or I can request a printed copy by calling the office of [facility].    I understand that my insurance will be billed for this visit.   I have read or had this consent read to me. I understand the contents of this consent, which adequately explains the benefits and risks of the Services being provided via telemedicine.  I have been provided ample opportunity to ask questions regarding this consent and the Services and have had my questions answered to my satisfaction. I give my informed consent for the services to be provided through the use of telemedicine in my medical care

## 2023-08-03 ENCOUNTER — Ambulatory Visit: Attending: Internal Medicine | Admitting: Emergency Medicine

## 2023-08-03 DIAGNOSIS — Z01810 Encounter for preprocedural cardiovascular examination: Secondary | ICD-10-CM

## 2023-08-03 NOTE — Progress Notes (Signed)
 Virtual Visit via Telephone Note   Because of Kathryn Castillo co-morbid illnesses, she is at least at moderate risk for complications without adequate follow up.  This format is felt to be most appropriate for this patient at this time.  Due to technical limitations with video connection Web designer), today's appointment will be conducted as an audio only telehealth visit, and Kathryn Castillo verbally agreed to proceed in this manner.   All issues noted in this document were discussed and addressed.  No physical exam could be performed with this format.  Evaluation Performed:  Preoperative cardiovascular risk assessment _____________   Date:  08/03/2023   Patient ID:  Kathryn Castillo, DOB Sep 09, 1950, MRN [id number] Patient Location:  Home Provider location:   Office  Primary Care Provider:  Renford Dills, MD Primary Cardiologist:  Reatha Harps, MD  Chief Complaint / Patient Profile   73 y.o. y/o female with a h/o CAD s/p CABG, hypertension, hyperlipidemia, diastolic heart failure, OSA who is pending bilateral upper eyelid biepharoptosis repair CC Bilateral upper eyelid blepharoplasty on 08/10/2023 with a [facility] aesthetics with Dr. Dimas Millin and presents today for telephonic preoperative cardiovascular risk assessment.  History of Present Illness    Kathryn Castillo is a 73 y.o. female who presents via audio/video conferencing for a telehealth visit today.  Pt was last seen in cardiology clinic on 05/07/2023 by Dr. Bufford Buttner.  At that time [facility] was doing well.  The patient is now pending procedure as outlined above. Since her last visit, she denies chest pain, shortness of breath, lower extremity edema, fatigue, palpitations, melena, hematuria, hemoptysis, diaphoresis, weakness, presyncope, syncope, orthopnea, and PND.  Patient is doing well overall.  She denies any acute cardiovascular concerns or complaints.  She does stay relatively active.  She is  without any anginal symptoms.  Past Medical History    Past Medical History:  Diagnosis Date   Arthritis    Carotid stenosis    Coronary artery disease    s/p remote CABG x1 with LIMA to LAD placed in 1996.   LHC in 2010 showed an occluded LAD to the ostium and patent LIMA to the LAD.  Repeat left heart cath in 08/23/15 in setting of angina and acute diastolic CHF showed single vessel obstructive CAD with ostial LAD occlusion and  LIMA to LAD was widely patent.   Hyperlipemia    Hypertension    Hypothyroidism    S/P bunionectomy    Sleep apnea    uses a cpap   Trigger thumb of left hand 09/17/2012   Past Surgical History:  Procedure Laterality Date   CARDIAC CATHETERIZATION     x3   CARDIAC CATHETERIZATION N/A 08/23/2015   Procedure: Left Heart Cath and Cors/Grafts Angiography;  Surgeon: Peter M Swaziland, MD;  [facility] INVASIVE CV LAB;  Service: Cardiovascular;  Laterality: N/A;   COLONOSCOPY WITH PROPOFOL N/A 10/13/2016   Procedure: COLONOSCOPY WITH PROPOFOL;  Surgeon: Charolett Bumpers, MD;  Location: [facility] ENDOSCOPY;  Service: Endoscopy;  Laterality: N/A;   CORONARY ARTERY BYPASS GRAFT  1996   x1   DILATION AND CURETTAGE OF UTERUS  1985   EYE SURGERY Left 10/02/2016   ioc for cataracts   KNEE ARTHROSCOPY WITH PATELLA RECONSTRUCTION Left 1968   left   NASAL SEPTOPLASTY W/ TURBINOPLASTY  2008   to help tolerated cpap   TRIGGER FINGER RELEASE Left 09/17/2012   Procedure: RELEASE TRIGGER FINGER/A-1 PULLEY LEFT THUMB (TENDON SHEATH INCISION);  Surgeon: Ivin Booty  Ancel Kass, MD;  Location: [facility];  Service: Orthopedics;  Laterality: Left;   TUBAL LIGATION      Allergies  Allergies  Allergen Reactions   Bactrim [Sulfamethoxazole-Trimethoprim] Other (See Comments)    REACTION: "DON'T REMEMBER"    Home Medications    Prior to Admission medications   Medication Sig Start Date End Date Taking? Authorizing Provider  aspirin EC 81 MG tablet Take 81 mg by mouth at  bedtime.    [provider]  atenolol (TENORMIN) 100 MG tablet Take 1 tablet (100 mg total) by mouth every evening. 08/24/15   Ruddy Corral M, PA-C  cholecalciferol (VITAMIN D) 1000 UNITS tablet Take 2,000 Units by mouth daily.     [provider]  dapagliflozin propanediol (FARXIGA) 10 MG TABS tablet Take 1 tablet (10 mg total) by mouth daily before breakfast. 12/13/21   Arty Binning, MD  levothyroxine (SYNTHROID, LEVOTHROID) 100 MCG tablet Take 100 mcg by mouth daily before breakfast.    [provider]  metFORMIN (GLUCOPHAGE) 500 MG tablet Take 500 mg by mouth at bedtime.    [provider]  Multiple Vitamins-Minerals (MULTIVITAMIN WITH MINERALS) tablet Take 1 tablet by mouth at bedtime.     [provider]  Omega-3 1000 MG CAPS Take 2,000 mg by mouth daily.    [provider]  OZEMPIC, 1 MG/DOSE, 4 MG/3ML SOPN Inject 1 mg into the skin once a week. Patient not taking: Reported on 07/23/2023 05/25/23   [provider]  rosuvastatin (CRESTOR) 40 MG tablet Take 40 mg by mouth at bedtime.    [provider]    Physical Exam    Vital Signs:  Kathryn Castillo does not have vital signs available for review today.  Given telephonic nature of communication, physical exam is limited. AAOx3. NAD. Normal affect.  Speech and respirations are unlabored.  Accessory Clinical Findings    None  Assessment & Plan    1.  Preoperative Cardiovascular Risk Assessment: According to the Revised Cardiac Risk Index (RCRI), her Perioperative Risk of Major Cardiac Event is (%): 6.6. Her Functional Capacity in METs is: 6.27 according to the Duke Activity Status Index (DASI). Therefore, based on ACC/AHA guidelines, patient would be at acceptable risk for the planned procedure without further cardiovascular testing.   The patient was advised that if she develops new symptoms prior to surgery to contact our office to arrange for a follow-up  visit, and she verbalized understanding.  She may hold aspirin for 5-7 days prior to procedure. Please resume aspirin as soon as possible postprocedure, at the discretion of the surgeon.    A copy of this note will be routed to requesting surgeon.  Time:   Today, I have spent 7 minutes with the patient with telehealth technology discussing medical history, symptoms, and management plan.     Ava Boatman, NP  08/03/2023, 1:09 PM

## 2023-08-06 DIAGNOSIS — I10 Essential (primary) hypertension: Secondary | ICD-10-CM | POA: Diagnosis not present

## 2023-08-06 DIAGNOSIS — E1169 Type 2 diabetes mellitus with other specified complication: Secondary | ICD-10-CM | POA: Diagnosis not present

## 2023-08-06 DIAGNOSIS — E7800 Pure hypercholesterolemia, unspecified: Secondary | ICD-10-CM | POA: Diagnosis not present

## 2023-08-06 DIAGNOSIS — I2510 Atherosclerotic heart disease of native coronary artery without angina pectoris: Secondary | ICD-10-CM | POA: Diagnosis not present

## 2023-08-06 DIAGNOSIS — R0989 Other specified symptoms and signs involving the circulatory and respiratory systems: Secondary | ICD-10-CM | POA: Diagnosis not present

## 2023-08-06 DIAGNOSIS — I771 Stricture of artery: Secondary | ICD-10-CM | POA: Diagnosis not present

## 2023-08-06 DIAGNOSIS — E039 Hypothyroidism, unspecified: Secondary | ICD-10-CM | POA: Diagnosis not present

## 2023-08-06 DIAGNOSIS — G4733 Obstructive sleep apnea (adult) (pediatric): Secondary | ICD-10-CM | POA: Diagnosis not present

## 2023-08-10 DIAGNOSIS — H02423 Myogenic ptosis of bilateral eyelids: Secondary | ICD-10-CM | POA: Diagnosis not present

## 2023-08-10 DIAGNOSIS — H02831 Dermatochalasis of right upper eyelid: Secondary | ICD-10-CM | POA: Diagnosis not present

## 2023-08-10 DIAGNOSIS — H0279 Other degenerative disorders of eyelid and periocular area: Secondary | ICD-10-CM | POA: Diagnosis not present

## 2023-08-10 DIAGNOSIS — H02421 Myogenic ptosis of right eyelid: Secondary | ICD-10-CM | POA: Diagnosis not present

## 2023-08-10 DIAGNOSIS — H02422 Myogenic ptosis of left eyelid: Secondary | ICD-10-CM | POA: Diagnosis not present

## 2023-08-10 DIAGNOSIS — H02411 Mechanical ptosis of right eyelid: Secondary | ICD-10-CM | POA: Diagnosis not present

## 2023-08-10 DIAGNOSIS — H53483 Generalized contraction of visual field, bilateral: Secondary | ICD-10-CM | POA: Diagnosis not present

## 2023-08-10 DIAGNOSIS — H57813 Brow ptosis, bilateral: Secondary | ICD-10-CM | POA: Diagnosis not present

## 2023-08-10 DIAGNOSIS — H02413 Mechanical ptosis of bilateral eyelids: Secondary | ICD-10-CM | POA: Diagnosis not present

## 2023-08-10 DIAGNOSIS — H02412 Mechanical ptosis of left eyelid: Secondary | ICD-10-CM | POA: Diagnosis not present

## 2023-08-10 DIAGNOSIS — H02834 Dermatochalasis of left upper eyelid: Secondary | ICD-10-CM | POA: Diagnosis not present

## 2023-08-10 DIAGNOSIS — H0289 Other specified disorders of eyelid: Secondary | ICD-10-CM | POA: Diagnosis not present

## 2023-08-17 DIAGNOSIS — M1711 Unilateral primary osteoarthritis, right knee: Secondary | ICD-10-CM | POA: Diagnosis not present

## 2023-08-18 NOTE — Progress Notes (Signed)
 COVID Vaccine Completed: yes  Date of COVID positive in last 90 days:  PCP - Merl Star, MD Cardiologist - Jackquelyn Mass, MD  Cardiac clearance by Palmer Bobo, NP 08/03/23 in Epic   Chest x-ray -  EKG - 05/07/23 Epic Stress Test -  ECHO - 05/17/21 Epic Cardiac Cath - 08/23/15 Epic Pacemaker/ICD device last checked: Spinal Cord Stimulator:  Bowel Prep -   Sleep Study -  CPAP -   Fasting Blood Sugar -  Checks Blood Sugar _____ times a day  Last dose of GLP1 agonist-  N/A GLP1 instructions:  Hold 7 days before surgery    Last dose of SGLT-2 inhibitors-  N/A SGLT-2 instructions:  Hold 3 days before surgery    Blood Thinner Instructions:  Last dose:   Time: Aspirin  Instructions: ASA 81, hold 7 days Last Dose:  Activity level:  Can go up a flight of stairs and perform activities of daily living without stopping and without symptoms of chest pain or shortness of breath.  Able to exercise without symptoms  Unable to go up a flight of stairs without symptoms of     Anesthesia review: CAD s/p CABG, HTN, CHF, OSA, 1st degree AV block  Patient denies shortness of breath, fever, cough and chest pain at PAT appointment  Patient verbalized understanding of instructions that were given to them at the PAT appointment. Patient was also instructed that they will need to review over the PAT instructions again at home before surgery.

## 2023-08-18 NOTE — Patient Instructions (Signed)
 SURGICAL WAITING ROOM VISITATION  Patients having surgery or a procedure may have no more than 2 support people in the waiting area - these visitors may rotate.    Children under the age of 36 must have an adult with them who is not the patient.  Due to an increase in RSV and influenza rates and associated hospitalizations, children ages 67 and under may not visit patients in [facility] hospitals.  Visitors with respiratory illnesses are discouraged from visiting and should remain at home.  If the patient needs to stay at the hospital during part of their recovery, the visitor guidelines for inpatient rooms apply. Pre-op nurse will coordinate an appropriate time for 1 support person to accompany patient in pre-op.  This support person may not rotate.    Please refer to the [facility] website for the visitor guidelines for Inpatients (after your surgery is over and you are in a regular room).       Your procedure is scheduled on:    Report to [facility] Main Entrance    Report to admitting at AM   Call this number if you have problems the morning of surgery [phone]   Do not eat food :After Midnight.   After Midnight you may have the following liquids until ______ AM/ PM DAY OF SURGERY  Water Non-Citrus Juices (without pulp, NO RED-Apple, White grape, White cranberry) Black Coffee (NO MILK/CREAM OR CREAMERS, sugar ok)  Clear Tea (NO MILK/CREAM OR CREAMERS, sugar ok) regular and decaf                             Plain Jell-O (NO RED)                                           Fruit ices (not with fruit pulp, NO RED)                                     Popsicles (NO RED)                                                               Sports drinks like Gatorade (NO RED)              Drink 2 Ensure/G2 drinks AT 10:00 PM the night before surgery.        The day of surgery:  Drink ONE (1) Pre-Surgery Clear Ensure or G2 at AM the morning of surgery. Drink in one  sitting. Do not sip.  This drink was given to you during your hospital  pre-op appointment visit. Nothing else to drink after completing the  Pre-Surgery Clear Ensure or G2.          If you have questions, please contact your surgeon's office.   FOLLOW BOWEL PREP AND ANY ADDITIONAL PRE OP INSTRUCTIONS YOU RECEIVED FROM YOUR SURGEON'S OFFICE!!!     Oral Hygiene is also important to reduce your risk of infection.  Remember - BRUSH YOUR TEETH THE MORNING OF SURGERY WITH YOUR REGULAR TOOTHPASTE  DENTURES WILL BE REMOVED PRIOR TO SURGERY PLEASE DO NOT APPLY "Poly grip" OR ADHESIVES!!!   Do NOT smoke after Midnight   Stop all vitamins and herbal supplements 7 days before surgery.   Take these medicines the morning of surgery with A SIP OF WATER:   DO NOT TAKE ANY ORAL DIABETIC MEDICATIONS DAY OF YOUR SURGERY  How to Manage Your Diabetes Before and After Surgery  Why is it important to control my blood sugar before and after surgery? Improving blood sugar levels before and after surgery helps healing and can limit problems. A way of improving blood sugar control is eating a healthy diet by:  Eating less sugar and carbohydrates  Increasing activity/exercise  Talking with your doctor about reaching your blood sugar goals High blood sugars (greater than 180 mg/dL) can raise your risk of infections and slow your recovery, so you will need to focus on controlling your diabetes during the weeks before surgery. Make sure that the doctor who takes care of your diabetes knows about your planned surgery including the date and location.  How do I manage my blood sugar before surgery? Check your blood sugar at least 4 times a day, starting 2 days before surgery, to make sure that the level is not too high or low. Check your blood sugar the morning of your surgery when you wake up and every 2 hours until you get to the Short Stay unit. If your blood sugar is less  than 70 mg/dL, you will need to treat for low blood sugar: Do not take insulin. Treat a low blood sugar (less than 70 mg/dL) with  cup of clear juice (cranberry or apple), 4 glucose tablets, OR glucose gel. Recheck blood sugar in 15 minutes after treatment (to make sure it is greater than 70 mg/dL). If your blood sugar is not greater than 70 mg/dL on recheck, call [phone] for further instructions. Report your blood sugar to the short stay nurse when you get to Short Stay.  If you are admitted to the hospital after surgery: Your blood sugar will be checked by the staff and you will probably be given insulin after surgery (instead of oral diabetes medicines) to make sure you have good blood sugar levels. The goal for blood sugar control after surgery is 80-180 mg/dL.   WHAT DO I DO ABOUT MY DIABETES MEDICATION?  Do not take oral diabetes medicines (pills) the morning of surgery.  THE NIGHT BEFORE SURGERY, take     units of       insulin.       THE MORNING OF SURGERY, take   units of         insulin.  DO NOT TAKE THE FOLLOWING 7 DAYS PRIOR TO SURGERY: Ozempic, Wegovy, Rybelsus (Semaglutide), Byetta (exenatide), Bydureon (exenatide ER), Victoza, Saxenda (liraglutide), or Trulicity (dulaglutide) Mounjaro (Tirzepatide) Adlyxin (Lixisenatide), Polyethylene Glycol Loxenatide.  If your CBG is greater than 220 mg/dL, you may take  of your sliding scale  (correction) dose of insulin.    For patients with insulin pumps: Contact your diabetes doctor for specific instructions before surgery. Decrease basal rates by 20% at midnight the night before your surgery. Note that if your surgery is planned to be longer than 2 hours, your insulin pump will be removed and intravenous (IV) insulin will be started and managed by the nurses and the anesthesiologist. You will be able to restart your  insulin pump once you are awake and able to manage it.  Make sure to bring insulin pump supplies to the  hospital with you in case the  site needs to be changed.  Patient Signature:  Date:   Nurse Signature:  Date:   Reviewed and Endorsed by [facility] Patient Education Committee, August 2015  Bring CPAP mask and tubing day of surgery.                              You may not have any metal on your body including hair pins, jewelry, and body piercing             Do not wear make-up, lotions, powders, perfumes/cologne, or deodorant  Do not wear nail polish including gel and S&S, artificial/acrylic nails, or any other type of covering on natural nails including finger and toenails. If you have artificial nails, gel coating, etc. that needs to be removed by a nail salon please have this removed prior to surgery or surgery may need to be canceled/ delayed if the surgeon/ anesthesia feels like they are unable to be safely monitored.   Do not shave  48 hours prior to surgery.    Do not bring valuables to the hospital. [facility] IS NOT             RESPONSIBLE   FOR VALUABLES.   Contacts, glasses, dentures or bridgework may not be worn into surgery.   Bring small overnight bag day of surgery.   DO NOT BRING YOUR HOME MEDICATIONS TO THE HOSPITAL. PHARMACY WILL DISPENSE MEDICATIONS LISTED ON YOUR MEDICATION LIST TO YOU DURING YOUR ADMISSION IN THE HOSPITAL!    Patients discharged on the day of surgery will not be allowed to drive home.  Someone NEEDS to stay with you for the first 24 hours after anesthesia.   Special Instructions: Bring a copy of your healthcare power of attorney and living will documents the day of surgery if you haven't scanned them before.              Please read over the following fact sheets you were given: IF YOU HAVE QUESTIONS ABOUT YOUR PRE-OP INSTRUCTIONS PLEASE CALL [phone]   If you received a COVID test during your pre-op visit  it is requested that you wear a mask when out in public, stay away from anyone that may not be feeling well and notify your  surgeon if you develop symptoms. If you test positive for Covid or have been in contact with anyone that has tested positive in the last 10 days please notify you surgeon.      Pre-operative 5 CHG Bath Instructions   You can play a key role in reducing the risk of infection after surgery. Your skin needs to be as free of germs as possible. You can reduce the number of germs on your skin by washing with CHG (chlorhexidine gluconate) soap before surgery. CHG is an antiseptic soap that kills germs and continues to kill germs even after washing.   DO NOT use if you have an allergy to chlorhexidine/CHG or antibacterial soaps. If your skin becomes reddened or irritated, stop using the CHG and notify one of our RNs at [phone].   Please shower with the CHG soap starting 4 days before surgery using the following schedule:     Please keep in mind the following:  DO NOT shave, including legs and underarms, starting the  day of your first shower.   You may shave your face at any point before/day of surgery.  Place clean sheets on your bed the day you start using CHG soap. Use a clean washcloth (not used since being washed) for each shower. DO NOT sleep with pets once you start using the CHG.   CHG Shower Instructions:  If you choose to wash your hair and private area, wash first with your normal shampoo/soap.  After you use shampoo/soap, rinse your hair and body thoroughly to remove shampoo/soap residue.  Turn the water OFF and apply about 3 tablespoons (45 ml) of CHG soap to a CLEAN washcloth.  Apply CHG soap ONLY FROM YOUR NECK DOWN TO YOUR TOES (washing for 3-5 minutes)  DO NOT use CHG soap on face, private areas, open wounds, or sores.  Pay special attention to the area where your surgery is being performed.  If you are having back surgery, having someone wash your back for you may be helpful. Wait 2 minutes after CHG soap is applied, then you may rinse off the CHG soap.  Pat dry with a  clean towel  Put on clean clothes/pajamas   If you choose to wear lotion, please use ONLY the CHG-compatible lotions on the back of this paper.     Additional instructions for the day of surgery: DO NOT APPLY any lotions, deodorants, cologne, or perfumes.   Put on clean/comfortable clothes.  Brush your teeth.  Ask your nurse before applying any prescription medications to the skin.      CHG Compatible Lotions   Aveeno Moisturizing lotion  Cetaphil Moisturizing Cream  Cetaphil Moisturizing Lotion  Clairol Herbal Essence Moisturizing Lotion, Dry Skin  Clairol Herbal Essence Moisturizing Lotion, Extra Dry Skin  Clairol Herbal Essence Moisturizing Lotion, Normal Skin  Curel Age Defying Therapeutic Moisturizing Lotion with Alpha Hydroxy  Curel Extreme Care Body Lotion  Curel Soothing Hands Moisturizing Hand Lotion  Curel Therapeutic Moisturizing Cream, Fragrance-Free  Curel Therapeutic Moisturizing Lotion, Fragrance-Free  Curel Therapeutic Moisturizing Lotion, Original Formula  Eucerin Daily Replenishing Lotion  Eucerin Dry Skin Therapy Plus Alpha Hydroxy Crme  Eucerin Dry Skin Therapy Plus Alpha Hydroxy Lotion  Eucerin Original Crme  Eucerin Original Lotion  Eucerin Plus Crme Eucerin Plus Lotion  Eucerin TriLipid Replenishing Lotion  Keri Anti-Bacterial Hand Lotion  Keri Deep Conditioning Original Lotion Dry Skin Formula Softly Scented  Keri Deep Conditioning Original Lotion, Fragrance Free Sensitive Skin Formula  Keri Lotion Fast Absorbing Fragrance Free Sensitive Skin Formula  Keri Lotion Fast Absorbing Softly Scented Dry Skin Formula  Keri Original Lotion  Keri Skin Renewal Lotion Keri Silky Smooth Lotion  Keri Silky Smooth Sensitive Skin Lotion  Nivea Body Creamy Conditioning Oil  Nivea Body Extra Enriched Lotion  Nivea Body Original Lotion  Nivea Body Sheer Moisturizing Lotion Nivea Crme  Nivea Skin Firming Lotion  NutraDerm 30 Skin Lotion  NutraDerm Skin  Lotion  NutraDerm Therapeutic Skin Cream  NutraDerm Therapeutic Skin Lotion  ProShield Protective Hand Cream  Provon moisturizing lotion   View Pre-Surgery Education Videos:  [email or web address]     Incentive Spirometer  An incentive spirometer is a tool that can help keep your lungs clear and active. This tool measures how well you are filling your lungs with each breath. Taking long deep breaths may help reverse or decrease the chance of developing breathing (pulmonary) problems (especially infection) following: A long period of time when you are unable to move or be active. BEFORE THE PROCEDURE  If  the spirometer includes an indicator to show your best effort, your nurse or respiratory therapist will set it to a desired goal. If possible, sit up straight or lean slightly forward. Try not to slouch. Hold the incentive spirometer in an upright position. INSTRUCTIONS FOR USE  Sit on the edge of your bed if possible, or sit up as far as you can in bed or on a chair. Hold the incentive spirometer in an upright position. Breathe out normally. Place the mouthpiece in your mouth and seal your lips tightly around it. Breathe in slowly and as deeply as possible, raising the piston or the ball toward the top of the column. Hold your breath for 3-5 seconds or for as long as possible. Allow the piston or ball to fall to the bottom of the column. Remove the mouthpiece from your mouth and breathe out normally. Rest for a few seconds and repeat Steps 1 through 7 at least 10 times every 1-2 hours when you are awake. Take your time and take a few normal breaths between deep breaths. The spirometer may include an indicator to show your best effort. Use the indicator as a goal to work toward during each repetition. After each set of 10 deep breaths, practice coughing to be sure your lungs are clear. If you have an incision (the cut made at the  time of surgery), support your incision when coughing by placing a pillow or rolled up towels firmly against it. Once you are able to get out of bed, walk around indoors and cough well. You may stop using the incentive spirometer when instructed by your caregiver.  RISKS AND COMPLICATIONS Take your time so you do not get dizzy or light-headed. If you are in pain, you may need to take or ask for pain medication before doing incentive spirometry. It is harder to take a deep breath if you are having pain. AFTER USE Rest and breathe slowly and easily. It can be helpful to keep track of a log of your progress. Your caregiver can provide you with a simple table to help with this. If you are using the spirometer at home, follow these instructions: SEEK MEDICAL CARE IF:  You are having difficultly using the spirometer. You have trouble using the spirometer as often as instructed. Your pain medication is not giving enough relief while using the spirometer. You develop fever of 100.5 F (38.1 C) or higher. SEEK IMMEDIATE MEDICAL CARE IF:  You cough up bloody sputum that had not been present before. You develop fever of 102 F (38.9 C) or greater. You develop worsening pain at or near the incision site. MAKE SURE YOU:  Understand these instructions. Will watch your condition. Will get help right away if you are not doing well or get worse. Document Released: 08/18/2006 Document Revised: 06/30/2011 Document Reviewed: 10/19/2006 [facility] Patient Information 2014 [place], [place].   ________________________________________________________________________

## 2023-08-18 NOTE — Care Plan (Signed)
 Ortho Bundle Case Management Note  Patient Details  Name: Simren Gushue MRN: [id number] Date of Birth: 19-Nov-1950   met with patient in the office for H&P. will discharge to home with friends to assist. rolling walker ordered. HHPT referral to [facility]. HPPT set up with [facility]. discharge instructions discussed and questions answered. Patient and MD in agreement.Choice offered                     DME Arranged:  Walker rolling DME Agency:  [employer]  HH Arranged:  PT HH Agency:  [facility]  Additional Comments: Please contact me with any questions of if this plan should need to change.  Cornelia Dieter,  RN,BSN,MHA,CCM  [facility]opaedic Specialist  [phone] 08/18/2023, 1:21 PM

## 2023-08-19 ENCOUNTER — Other Ambulatory Visit: Payer: Self-pay

## 2023-08-19 ENCOUNTER — Encounter (HOSPITAL_COMMUNITY): Payer: Self-pay

## 2023-08-19 ENCOUNTER — Encounter (HOSPITAL_COMMUNITY)
Admission: RE | Admit: 2023-08-19 | Discharge: 2023-08-19 | Disposition: A | Discharge status: Home / Self Care | Source: Ambulatory Visit | Attending: Orthopedic Surgery | Admitting: Orthopedic Surgery

## 2023-08-19 VITALS — BP 134/73 | HR 64 | Temp 98.1°F | Resp 14 | Ht 63.0 in | Wt 220.0 lb

## 2023-08-19 DIAGNOSIS — Z951 Presence of aortocoronary bypass graft: Secondary | ICD-10-CM | POA: Insufficient documentation

## 2023-08-19 DIAGNOSIS — Z7984 Long term (current) use of oral hypoglycemic drugs: Secondary | ICD-10-CM | POA: Insufficient documentation

## 2023-08-19 DIAGNOSIS — Z87891 Personal history of nicotine dependence: Secondary | ICD-10-CM | POA: Diagnosis not present

## 2023-08-19 DIAGNOSIS — I080 Rheumatic disorders of both mitral and aortic valves: Secondary | ICD-10-CM | POA: Diagnosis not present

## 2023-08-19 DIAGNOSIS — I2510 Atherosclerotic heart disease of native coronary artery without angina pectoris: Secondary | ICD-10-CM | POA: Insufficient documentation

## 2023-08-19 DIAGNOSIS — E119 Type 2 diabetes mellitus without complications: Secondary | ICD-10-CM | POA: Diagnosis not present

## 2023-08-19 DIAGNOSIS — M1711 Unilateral primary osteoarthritis, right knee: Secondary | ICD-10-CM | POA: Insufficient documentation

## 2023-08-19 DIAGNOSIS — Z01812 Encounter for preprocedural laboratory examination: Secondary | ICD-10-CM | POA: Diagnosis not present

## 2023-08-19 DIAGNOSIS — Z7985 Long-term (current) use of injectable non-insulin antidiabetic drugs: Secondary | ICD-10-CM | POA: Diagnosis not present

## 2023-08-19 DIAGNOSIS — I10 Essential (primary) hypertension: Secondary | ICD-10-CM | POA: Diagnosis not present

## 2023-08-19 DIAGNOSIS — Z01818 Encounter for other preprocedural examination: Secondary | ICD-10-CM

## 2023-08-19 DIAGNOSIS — G4730 Sleep apnea, unspecified: Secondary | ICD-10-CM | POA: Insufficient documentation

## 2023-08-19 LAB — HEMOGLOBIN A1C
Hgb A1c MFr Bld: 4.6 % — ABNORMAL LOW (ref 4.8–5.6)
Mean Plasma Glucose: 85.32 mg/dL

## 2023-08-19 LAB — CBC
HCT: 44.5 % (ref 36.0–46.0)
Hemoglobin: 14.9 g/dL (ref 12.0–15.0)
MCH: 28.3 pg (ref 26.0–34.0)
MCHC: 33.5 g/dL (ref 30.0–36.0)
MCV: 84.6 fL (ref 80.0–100.0)
Platelets: 274 10*3/uL (ref 150–400)
RBC: 5.26 MIL/uL — ABNORMAL HIGH (ref 3.87–5.11)
RDW: 13.7 % (ref 11.5–15.5)
WBC: 5.9 10*3/uL (ref 4.0–10.5)
nRBC: 0 % (ref 0.0–0.2)

## 2023-08-19 LAB — BASIC METABOLIC PANEL WITH GFR
Anion gap: 9 (ref 5–15)
BUN: 13 mg/dL (ref 8–23)
CO2: 25 mmol/L (ref 22–32)
Calcium: 9.3 mg/dL (ref 8.9–10.3)
Chloride: 105 mmol/L (ref 98–111)
Creatinine, Ser: 0.59 mg/dL (ref 0.44–1.00)
GFR, Estimated: >60 mL/min (ref >60)
Glucose, Bld: 122 mg/dL — ABNORMAL HIGH (ref 70–99)
Potassium: 4 mmol/L (ref 3.5–5.1)
Sodium: 139 mmol/L (ref 135–145)

## 2023-08-19 LAB — SURGICAL PCR SCREEN
MRSA, PCR: NEGATIVE
Staphylococcus aureus: NEGATIVE

## 2023-08-19 LAB — GLUCOSE, CAPILLARY: Glucose-Capillary: 117 mg/dL — ABNORMAL HIGH (ref 70–99)

## 2023-08-20 NOTE — Progress Notes (Signed)
 Anesthesia Chart Review   Case: [id number] Date/Time: 09/01/23 0715   Procedure: ARTHROPLASTY, KNEE, TOTAL (Right: Knee)   Anesthesia type: Spinal   Pre-op diagnosis: right knee OA   Location: [facility] ROOM 07 / [facility]   Surgeons: Osa Blase, MD       DISCUSSION:72 y.o. former smoker with h/o HTN, sleep apnea with cpap, CAD s/p CABG, right knee OA scheduled for above procedure 09/01/2023 with Dr. Osa Blase.   Per cardiology preoperative evaluation 08/03/2023, "According to the Revised Cardiac Risk Index (RCRI), her Perioperative Risk of Major Cardiac Event is (%): 6.6. Her Functional Capacity in METs is: 6.27 according to the Duke Activity Status Index (DASI). Therefore, based on ACC/AHA guidelines, patient would be at acceptable risk for the planned procedure without further cardiovascular testing.    The patient was advised that if she develops new symptoms prior to surgery to contact our office to arrange for a follow-up visit, and she verbalized understanding.   She may hold aspirin  for 5-7 days prior to procedure. Please resume aspirin  as soon as possible postprocedure, at the discretion of the surgeon. "  VS: BP 134/73   Pulse 64   Temp 36.7 C (Oral)   Resp 14   Ht 5\' 3"  (1.6 m)   Wt 99.8 kg   SpO2 97%   BMI 38.97 kg/m   PROVIDERS: Merl Star, MD is PCP   Cardiologist - Jackquelyn Mass, MD  LABS: Labs reviewed: Acceptable for surgery. (all labs ordered are listed, but only abnormal results are displayed)  Labs Reviewed  HEMOGLOBIN A1C - Abnormal; Notable for the following components:      Result Value   Hgb A1c MFr Bld 4.6 (*)    All other components within normal limits  BASIC METABOLIC PANEL WITH GFR - Abnormal; Notable for the following components:   Glucose, Bld 122 (*)    All other components within normal limits  CBC - Abnormal; Notable for the following components:   RBC 5.26 (*)    All other components within normal limits  GLUCOSE, CAPILLARY -  Abnormal; Notable for the following components:   Glucose-Capillary 117 (*)    All other components within normal limits  SURGICAL PCR SCREEN     IMAGES:   EKG:   CV: Echo 05/17/2021 1. Left ventricular ejection fraction, by estimation, is 60 to 65%. The  left ventricle has normal function. The left ventricle has no regional  wall motion abnormalities. Left ventricular diastolic parameters were  normal.   2. Right ventricular systolic function is normal. The right ventricular  size is normal.   3. Left atrial size was moderately dilated.   4. The mitral valve is abnormal. Mild mitral valve regurgitation. No  evidence of mitral stenosis.   5. The aortic valve is tricuspid. There is mild calcification of the  aortic valve. Aortic valve regurgitation is not visualized. Aortic valve  sclerosis is present, with no evidence of aortic valve stenosis.   6. The inferior vena cava is normal in size with greater than 50%  respiratory variability, suggesting right atrial pressure of 3 mmHg.  Past Medical History:  Diagnosis Date   Arthritis    Carotid stenosis    Coronary artery disease    s/p remote CABG x1 with LIMA to LAD placed in 1996.   LHC in 2010 showed an occluded LAD to the ostium and patent LIMA to the LAD.  Repeat left heart cath in 08/23/15 in setting of angina and acute  diastolic CHF showed single vessel obstructive CAD with ostial LAD occlusion and  LIMA to LAD was widely patent.   Hyperlipemia    Hypertension    Hypothyroidism    S/P bunionectomy    Sleep apnea    uses a cpap   Trigger thumb of left hand 09/17/2012    Past Surgical History:  Procedure Laterality Date   CARDIAC CATHETERIZATION     x3   CARDIAC CATHETERIZATION N/A 08/23/2015   Procedure: Left Heart Cath and Cors/Grafts Angiography;  Surgeon: Peter M Swaziland, MD;  Location: [facility] INVASIVE CV LAB;  Service: Cardiovascular;  Laterality: N/A;   COLONOSCOPY WITH PROPOFOL  N/A 10/13/2016   Procedure: COLONOSCOPY  WITH PROPOFOL ;  Surgeon: Garrett Kallman, MD;  Location: [facility];  Service: Endoscopy;  Laterality: N/A;   CORONARY ARTERY BYPASS GRAFT  1996   x1   DILATION AND CURETTAGE OF UTERUS  1985   EYE SURGERY Left 10/02/2016   ioc for cataracts   KNEE ARTHROSCOPY WITH PATELLA RECONSTRUCTION Left 1968   left   NASAL SEPTOPLASTY W/ TURBINOPLASTY  2008   to help tolerated cpap   TRIGGER FINGER RELEASE Left 09/17/2012   Procedure: RELEASE TRIGGER FINGER/A-1 PULLEY LEFT THUMB (TENDON SHEATH INCISION);  Surgeon: Neville Barbone, MD;  Location: [facility];  Service: Orthopedics;  Laterality: Left;   TUBAL LIGATION      MEDICATIONS:  aspirin  EC 81 MG tablet   atenolol  (TENORMIN ) 100 MG tablet   cholecalciferol  (VITAMIN D ) 1000 UNITS tablet   dapagliflozin  propanediol (FARXIGA ) 10 MG TABS tablet   levothyroxine (SYNTHROID, LEVOTHROID) 100 MCG tablet   metFORMIN (GLUCOPHAGE) 500 MG tablet   Multiple Vitamins-Minerals (MULTIVITAMIN WITH MINERALS) tablet   Omega-3 1000 MG CAPS   OZEMPIC, 1 MG/DOSE, 4 MG/3ML SOPN   rosuvastatin  (CRESTOR ) 40 MG tablet   No current facility-administered medications for this encounter.   Chick Cotton Ward, PA-C [facility] [phone]

## 2023-08-24 NOTE — H&P (Signed)
 KNEE ARTHROPLASTY ADMISSION H&P  Patient ID: Kathryn Castillo MRN: [id number] DOB/AGE: 1950/05/04 73 y.o.  Chief Complaint: right knee pain.  Planned Procedure Date: 09/01/23 Medical Clearance by Dr. Joice Nares   Cardiac Clearance by Morey Ar, NP   HPI: [name] is a 73 y.o. female who presents for evaluation of right knee OA. The patient has a history of pain and functional disability in the right knee due to arthritis and has failed non-surgical conservative treatments for greater than 12 weeks to include NSAID's and/or analgesics, corticosteriod injections, weight reduction as appropriate, and activity modification.  Onset of symptoms was gradual, starting 10 years ago with gradually worsening course since that time. The patient noted no past surgery on the right knee.  Patient currently rates pain at 9 out of 10 with activity. Patient has worsening of pain with activity and weight bearing and pain that interferes with activities of daily living.  Patient has evidence of joint space narrowing by imaging studies.  There is no active infection.  Past Medical History:  Diagnosis Date   Arthritis    Carotid stenosis    Coronary artery disease    s/p remote CABG x1 with LIMA to LAD placed in 1996.   LHC in 2010 showed an occluded LAD to the ostium and patent LIMA to the LAD.  Repeat left heart cath in 08/23/15 in setting of angina and acute diastolic CHF showed single vessel obstructive CAD with ostial LAD occlusion and  LIMA to LAD was widely patent.   Hyperlipemia    Hypertension    Hypothyroidism    S/P bunionectomy    Sleep apnea    uses a cpap   Trigger thumb of left hand 09/17/2012   Past Surgical History:  Procedure Laterality Date   CARDIAC CATHETERIZATION     x3   CARDIAC CATHETERIZATION N/A 08/23/2015   Procedure: Left Heart Cath and Cors/Grafts Angiography;  Surgeon: Peter M Swaziland, MD;  [facility] INVASIVE CV LAB;  Service: Cardiovascular;  Laterality:  N/A;   COLONOSCOPY WITH PROPOFOL  N/A 10/13/2016   Procedure: COLONOSCOPY WITH PROPOFOL ;  Surgeon: Garrett Kallman, MD;  [facility];  Service: Endoscopy;  Laterality: N/A;   CORONARY ARTERY BYPASS GRAFT  1996   x1   DILATION AND CURETTAGE OF UTERUS  1985   EYE SURGERY Left 10/02/2016   ioc for cataracts   KNEE ARTHROSCOPY WITH PATELLA RECONSTRUCTION Left 1968   left   NASAL SEPTOPLASTY W/ TURBINOPLASTY  2008   to help tolerated cpap   TRIGGER FINGER RELEASE Left 09/17/2012   Procedure: RELEASE TRIGGER FINGER/A-1 PULLEY LEFT THUMB (TENDON SHEATH INCISION);  Surgeon: Neville Barbone, MD;  Location: [facility];  Service: Orthopedics;  Laterality: Left;   TUBAL LIGATION     Allergies  Allergen Reactions   Bactrim [Sulfamethoxazole-Trimethoprim] Other (See Comments)    REACTION: "DON'T REMEMBER"   Prior to Admission medications   Medication Sig Start Date End Date Taking? Authorizing Provider  aspirin  EC 81 MG tablet Take 81 mg by mouth at bedtime.   Yes [provider]  atenolol  (TENORMIN ) 100 MG tablet Take 1 tablet (100 mg total) by mouth every evening. 08/24/15  Yes Simmons, Brittainy M, PA-C  cholecalciferol  (VITAMIN D ) 1000 UNITS tablet Take 2,000 Units by mouth daily.    Yes [provider]  dapagliflozin  propanediol (FARXIGA ) 10 MG TABS tablet Take 1 tablet (10 mg total) by mouth daily before breakfast. 12/13/21  Yes Arty Binning,  MD  levothyroxine (SYNTHROID, LEVOTHROID) 100 MCG tablet Take 100 mcg by mouth daily before breakfast.   Yes [provider]  metFORMIN (GLUCOPHAGE) 500 MG tablet Take 500 mg by mouth at bedtime.   Yes [provider]  Multiple Vitamins-Minerals (MULTIVITAMIN WITH MINERALS) tablet Take 1 tablet by mouth at bedtime.    Yes [provider]  Omega-3 1000 MG CAPS Take 2,000 mg by mouth daily.   Yes [provider]  OZEMPIC, 1 MG/DOSE, 4 MG/3ML SOPN Inject 1 mg into the skin once a  week. Patient not taking: Reported on 07/23/2023 05/25/23  Yes [provider]  rosuvastatin  (CRESTOR ) 40 MG tablet Take 40 mg by mouth at bedtime.   Yes [provider]   Social History   Socioeconomic History   Marital status: Divorced    Spouse name: Not on file   Number of children: 1   Years of education: Not on file   Highest education level: Not on file  Occupational History   Not on file  Tobacco Use   Smoking status: Former    Current packs/day: 0.00    Average packs/day: 1 pack/day for 35.0 years (35.0 ttl pk-yrs)    Types: Cigarettes    Start date: 07/30/1979    Quit date: 07/30/2014    Years since quitting: 9.0   Smokeless tobacco: Never  Vaping Use   Vaping status: Never Used  Substance and Sexual Activity   Alcohol  use: No    Alcohol /week: 0.0 standard drinks of alcohol    Drug use: No   Sexual activity: Not on file  Other Topics Concern   Not on file  Social History Narrative   Lives alone   Social Drivers of Health   Financial Resource Strain: Not on file  Food Insecurity: Not on file  Transportation Needs: Not on file  Physical Activity: Not on file  Stress: Not on file  Social Connections: Not on file   Family History  Problem Relation Age of Onset   Stroke Mother    Stroke Father    Heart attack Father 72   Hyperlipidemia Father    Hypertension Father     ROS: Currently denies lightheadedness, dizziness, Fever, chills, CP, SOB.   No personal history of DVT, PE, MI, or CVA. No loose teeth or dentures All other systems have been reviewed and were otherwise currently negative with the exception of those mentioned in the HPI and as above.  Objective: Vitals: HT: 5\' 3"    WT: 220 BMI: 39.0   T:  97.3    BP 118/71       P: 66  O2 SAT: 97% on room air.  Physical Exam: General: Alert, NAD.  Antalgic Gait  HEENT: EOMI, Good Neck Extension  Pulm: No increased work of breathing.  Clear B/L A/P w/o crackle or wheeze.  CV: RRR, No m/g/r  appreciated  GI: soft, NT, ND Neuro: Neuro without gross focal deficit.  Sensation intact distally Skin: No lesions in the area of chief complaint MSK/Surgical Site: She has 0-120 degrees of range of motion of the right knee. No medial or lateral joint line tenderness today. No effusion. EHL and FHL intact. Distal sensation intact. She has a 2+ PT pulse.    Imaging Review Plain radiographs demonstrate severe degenerative joint disease of the right knee.   Preoperative templating of the joint replacement has been completed, documented, and submitted to the Operating Room personnel in order to optimize intra-operative equipment management.  Assessment:  right knee OA    Plan: Plan for Procedure(s): ARTHROPLASTY, KNEE, TOTAL  The patient history, physical exam, clinical judgement of the provider and imaging are consistent with end stage degenerative joint disease and total joint arthroplasty is deemed medically necessary. The treatment options including medical management, injection therapy, and arthroplasty were discussed at length. The risks and benefits of Procedure(s): ARTHROPLASTY, KNEE, TOTAL were presented and reviewed.  The risks of nonoperative treatment, versus surgical intervention including but not limited to continued pain, aseptic loosening, stiffness, dislocation/subluxation, infection, bleeding, nerve injury, blood clots, cardiopulmonary complications, morbidity, mortality, among others were discussed. The patient verbalizes understanding and wishes to proceed with the plan.  Patient is being admitted for inpatient treatment for surgery, pain control, PT, prophylactic antibiotics, VTE prophylaxis, progressive ambulation, ADL's and discharge planning.   The patient does meet the criteria for TXA which will be used perioperatively.   ASA 325 mg will be used postoperatively for DVT prophylaxis in addition to SCDs, and early ambulation.   Khristie Sak K Kennadee Walthour, PA-C 08/24/2023 4:01  PM

## 2023-08-31 NOTE — Anesthesia Preprocedure Evaluation (Signed)
 Anesthesia Evaluation  Patient identified by MRN, date of birth, ID band Patient awake    Reviewed: Allergy & Precautions, NPO status , Patient's Chart, lab work & pertinent test results, reviewed documented beta blocker date and time   History of Anesthesia Complications Negative for: history of anesthetic complications  Airway Mallampati: II  TM Distance: >3 FB Neck ROM: Full    Dental no notable dental hx. (+) Upper Dentures, Lower Dentures   Pulmonary sleep apnea and Continuous Positive Airway Pressure Ventilation , former smoker   Pulmonary exam normal breath sounds clear to auscultation       Cardiovascular hypertension, Pt. on medications and Pt. on home beta blockers (-) angina + CAD and + CABG (1996)  (-) Past MI Normal cardiovascular exam Rhythm:Regular Rate:Normal  04/2021 TTE  1. Left ventricular ejection fraction, by estimation, is 60 to 65%. The  left ventricle has normal function. The left ventricle has no regional  wall motion abnormalities. Left ventricular diastolic parameters were  normal.   2. Right ventricular systolic function is normal. The right ventricular  size is normal.   3. Left atrial size was moderately dilated.   4. The mitral valve is abnormal. Mild mitral valve regurgitation. No  evidence of mitral stenosis.   5. The aortic valve is tricuspid. There is mild calcification of the  aortic valve. Aortic valve regurgitation is not visualized. Aortic valve  sclerosis is present, with no evidence of aortic valve stenosis.   6. The inferior vena cava is normal in size with greater than 50%  respiratory variability, suggesting right atrial pressure of 3 mmHg.     Neuro/Psych  Neuromuscular disease    GI/Hepatic   Endo/Other  diabetes, Type 2Hypothyroidism    Renal/GU Lab Results      Component                Value               Date                         K                        4.0                  08/19/2023                CO2                      25                  08/19/2023                BUN                      13                  08/19/2023                CREATININE               0.59                08/19/2023                GFRNONAA                 >60  08/19/2023                  Musculoskeletal  (+) Arthritis , Osteoarthritis,    Abdominal  (+) + obese  Peds  Hematology Lab Results      Component                Value               Date                      WBC                      5.9                 08/19/2023                HGB                      14.9                08/19/2023                HCT                      44.5                08/19/2023                MCV                      84.6                08/19/2023                PLT                      274                 08/19/2023              Anesthesia Other Findings   Reproductive/Obstetrics                             Anesthesia Physical Anesthesia Plan  ASA: 3  Anesthesia Plan: Spinal and Regional   Post-op Pain Management: Tylenol  PO (pre-op)*   Induction: Intravenous  PONV Risk Score and Plan: 2 and Propofol  infusion and Treatment may vary due to age or medical condition  Airway Management Planned: Nasal Cannula and Natural Airway  Additional Equipment: None  Intra-op Plan:   Post-operative Plan:   Informed Consent: I have reviewed the patients History and Physical, chart, labs and discussed the procedure including the risks, benefits and alternatives for the proposed anesthesia with the patient or authorized representative who has indicated his/her understanding and acceptance.     Dental advisory given  Plan Discussed with: CRNA and Surgeon  Anesthesia Plan Comments: (Spinal  w R Adductor)       Anesthesia Quick Evaluation

## 2023-09-01 ENCOUNTER — Other Ambulatory Visit: Payer: Self-pay

## 2023-09-01 ENCOUNTER — Ambulatory Visit (HOSPITAL_BASED_OUTPATIENT_CLINIC_OR_DEPARTMENT_OTHER): Payer: Self-pay | Admitting: Certified Registered"

## 2023-09-01 ENCOUNTER — Observation Stay (HOSPITAL_COMMUNITY)

## 2023-09-01 ENCOUNTER — Ambulatory Visit (HOSPITAL_COMMUNITY): Payer: Self-pay | Admitting: Physician Assistant

## 2023-09-01 ENCOUNTER — Encounter (HOSPITAL_COMMUNITY): Payer: Self-pay | Admitting: Orthopedic Surgery

## 2023-09-01 ENCOUNTER — Observation Stay (HOSPITAL_COMMUNITY)
Admission: RE | Admit: 2023-09-01 | Discharge: 2023-09-04 | Disposition: A | Discharge status: Home Health | Payer: PPO | Source: Ambulatory Visit | Attending: Orthopedic Surgery | Admitting: Orthopedic Surgery

## 2023-09-01 ENCOUNTER — Encounter (HOSPITAL_COMMUNITY)
Admission: RE | Disposition: A | Discharge status: Home Health | Payer: Self-pay | Source: Ambulatory Visit | Attending: Orthopedic Surgery

## 2023-09-01 DIAGNOSIS — Z7982 Long term (current) use of aspirin: Secondary | ICD-10-CM | POA: Insufficient documentation

## 2023-09-01 DIAGNOSIS — E039 Hypothyroidism, unspecified: Secondary | ICD-10-CM | POA: Insufficient documentation

## 2023-09-01 DIAGNOSIS — I2510 Atherosclerotic heart disease of native coronary artery without angina pectoris: Secondary | ICD-10-CM | POA: Diagnosis not present

## 2023-09-01 DIAGNOSIS — Z96651 Presence of right artificial knee joint: Principal | ICD-10-CM

## 2023-09-01 DIAGNOSIS — G4733 Obstructive sleep apnea (adult) (pediatric): Secondary | ICD-10-CM | POA: Diagnosis not present

## 2023-09-01 DIAGNOSIS — M1711 Unilateral primary osteoarthritis, right knee: Principal | ICD-10-CM | POA: Insufficient documentation

## 2023-09-01 DIAGNOSIS — Z951 Presence of aortocoronary bypass graft: Secondary | ICD-10-CM | POA: Insufficient documentation

## 2023-09-01 DIAGNOSIS — Z7984 Long term (current) use of oral hypoglycemic drugs: Secondary | ICD-10-CM | POA: Diagnosis not present

## 2023-09-01 DIAGNOSIS — I10 Essential (primary) hypertension: Secondary | ICD-10-CM | POA: Insufficient documentation

## 2023-09-01 DIAGNOSIS — Z79899 Other long term (current) drug therapy: Secondary | ICD-10-CM | POA: Insufficient documentation

## 2023-09-01 DIAGNOSIS — Z471 Aftercare following joint replacement surgery: Secondary | ICD-10-CM | POA: Diagnosis not present

## 2023-09-01 DIAGNOSIS — E119 Type 2 diabetes mellitus without complications: Secondary | ICD-10-CM

## 2023-09-01 DIAGNOSIS — R609 Edema, unspecified: Secondary | ICD-10-CM | POA: Diagnosis not present

## 2023-09-01 DIAGNOSIS — Z87891 Personal history of nicotine dependence: Secondary | ICD-10-CM | POA: Insufficient documentation

## 2023-09-01 DIAGNOSIS — G8918 Other acute postprocedural pain: Secondary | ICD-10-CM | POA: Diagnosis not present

## 2023-09-01 DIAGNOSIS — I5032 Chronic diastolic (congestive) heart failure: Secondary | ICD-10-CM | POA: Diagnosis not present

## 2023-09-01 DIAGNOSIS — I110 Hypertensive heart disease with heart failure: Secondary | ICD-10-CM | POA: Diagnosis not present

## 2023-09-01 HISTORY — PX: TOTAL KNEE ARTHROPLASTY: SHX125

## 2023-09-01 LAB — GLUCOSE, CAPILLARY
Glucose-Capillary: 95 mg/dL (ref 70–99)
Glucose-Capillary: 114 mg/dL — ABNORMAL HIGH (ref 70–99)

## 2023-09-01 SURGERY — ARTHROPLASTY, KNEE, TOTAL
Anesthesia: Regional | Site: Knee | Laterality: Right

## 2023-09-01 MED ORDER — MENTHOL 3 MG MT LOZG: 1.0000 | LOZENGE | OROMUCOSAL | Status: DC | PRN

## 2023-09-01 MED ORDER — METHOCARBAMOL 500 MG PO TABS
500.0000 mg | ORAL_TABLET | Freq: Four times a day (QID) | ORAL | Status: DC | PRN
  Administered 2023-09-01 – 2023-09-04 (×7): 500 mg via ORAL
  Filled 2023-09-01 (×7): qty 1

## 2023-09-01 MED ORDER — DEXAMETHASONE SODIUM PHOSPHATE 10 MG/ML IJ SOLN
INTRAMUSCULAR | Status: AC
  Filled 2023-09-01: qty 1

## 2023-09-01 MED ORDER — PROPOFOL 10 MG/ML IV BOLUS
INTRAVENOUS | Status: AC
  Filled 2023-09-01: qty 20

## 2023-09-01 MED ORDER — HYDROMORPHONE HCL 1 MG/ML IJ SOLN
INTRAMUSCULAR | Status: AC
  Filled 2023-09-01: qty 1

## 2023-09-01 MED ORDER — ALUM & MAG HYDROXIDE-SIMETH 200-200-20 MG/5ML PO SUSP: 30.0000 mL | ORAL | Status: DC | PRN

## 2023-09-01 MED ORDER — INSULIN ASPART 100 UNIT/ML IJ SOLN: 0.0000 [IU] | INTRAMUSCULAR | Status: DC | PRN

## 2023-09-01 MED ORDER — METOCLOPRAMIDE HCL 5 MG PO TABS: 5.0000 mg | ORAL_TABLET | Freq: Three times a day (TID) | ORAL | Status: DC | PRN

## 2023-09-01 MED ORDER — HYDROMORPHONE HCL 1 MG/ML IJ SOLN
0.5000 mg | INTRAMUSCULAR | Status: DC | PRN
  Administered 2023-09-01: 1 mg via INTRAVENOUS
  Filled 2023-09-01: qty 1

## 2023-09-01 MED ORDER — PHENYLEPHRINE HCL-NACL 20-0.9 MG/250ML-% IV SOLN
INTRAVENOUS | Status: DC | PRN
  Administered 2023-09-01: 20 ug/min via INTRAVENOUS

## 2023-09-01 MED ORDER — POTASSIUM CHLORIDE IN NACL 20-0.9 MEQ/L-% IV SOLN
INTRAVENOUS | Status: AC
  Filled 2023-09-01: qty 1000

## 2023-09-01 MED ORDER — CLONIDINE HCL (ANALGESIA) 100 MCG/ML EP SOLN
EPIDURAL | Status: DC | PRN
  Administered 2023-09-01: 100 ug

## 2023-09-01 MED ORDER — BUPIVACAINE HCL (PF) 0.25 % IJ SOLN
INTRAMUSCULAR | Status: AC
  Filled 2023-09-01: qty 30

## 2023-09-01 MED ORDER — CHLORHEXIDINE GLUCONATE 0.12 % MT SOLN
15.0000 mL | Freq: Once | OROMUCOSAL | Status: AC
  Administered 2023-09-01: 15 mL via OROMUCOSAL

## 2023-09-01 MED ORDER — CEFAZOLIN SODIUM-DEXTROSE 2-4 GM/100ML-% IV SOLN
2.0000 g | Freq: Four times a day (QID) | INTRAVENOUS | Status: AC
  Administered 2023-09-01 – 2023-09-02 (×2): 2 g via INTRAVENOUS
  Filled 2023-09-01 (×2): qty 100

## 2023-09-01 MED ORDER — POVIDONE-IODINE 7.5 % EX SOLN: Freq: Once | CUTANEOUS | Status: DC

## 2023-09-01 MED ORDER — PROPOFOL 1000 MG/100ML IV EMUL
INTRAVENOUS | Status: AC
  Filled 2023-09-01: qty 100

## 2023-09-01 MED ORDER — ONDANSETRON HCL 4 MG PO TABS
4.0000 mg | ORAL_TABLET | Freq: Four times a day (QID) | ORAL | Status: DC | PRN
  Administered 2023-09-03: 4 mg via ORAL
  Filled 2023-09-01: qty 1

## 2023-09-01 MED ORDER — PHENOL 1.4 % MT LIQD: 1.0000 | OROMUCOSAL | Status: DC | PRN

## 2023-09-01 MED ORDER — SODIUM CHLORIDE 0.9 % IR SOLN
Status: DC | PRN
  Administered 2023-09-01: 1000 mL

## 2023-09-01 MED ORDER — GLYCOPYRROLATE 0.2 MG/ML IJ SOLN
INTRAMUSCULAR | Status: DC | PRN
Start: 2023-09-01 — End: 2023-09-01
  Administered 2023-09-01: .2 mg via INTRAVENOUS

## 2023-09-01 MED ORDER — LIDOCAINE HCL (PF) 2 % IJ SOLN
INTRAMUSCULAR | Status: AC
  Filled 2023-09-01: qty 5

## 2023-09-01 MED ORDER — ACETAMINOPHEN 325 MG PO TABS
325.0000 mg | ORAL_TABLET | Freq: Four times a day (QID) | ORAL | Status: DC | PRN
  Administered 2023-09-03 (×2): 650 mg via ORAL
  Filled 2023-09-01 (×2): qty 2

## 2023-09-01 MED ORDER — HYDROMORPHONE HCL 1 MG/ML IJ SOLN
0.2500 mg | INTRAMUSCULAR | Status: DC | PRN
  Administered 2023-09-01 (×2): 0.5 mg via INTRAVENOUS

## 2023-09-01 MED ORDER — HYDROMORPHONE HCL 1 MG/ML IJ SOLN
INTRAMUSCULAR | Status: AC
Start: 2023-09-01 — End: ?
  Filled 2023-09-01: qty 1

## 2023-09-01 MED ORDER — TRANEXAMIC ACID-NACL 1000-0.7 MG/100ML-% IV SOLN
1000.0000 mg | Freq: Once | INTRAVENOUS | Status: AC
  Administered 2023-09-01: 1000 mg via INTRAVENOUS
  Filled 2023-09-01: qty 100

## 2023-09-01 MED ORDER — POVIDONE-IODINE 10 % EX SWAB: 2.0000 | Freq: Once | CUTANEOUS | Status: DC

## 2023-09-01 MED ORDER — 0.9 % SODIUM CHLORIDE (POUR BTL) OPTIME
TOPICAL | Status: DC | PRN
  Administered 2023-09-01: 1000 mL

## 2023-09-01 MED ORDER — DOCUSATE SODIUM 100 MG PO CAPS
100.0000 mg | ORAL_CAPSULE | Freq: Two times a day (BID) | ORAL | Status: DC
  Administered 2023-09-01 – 2023-09-04 (×6): 100 mg via ORAL
  Filled 2023-09-01 (×6): qty 1

## 2023-09-01 MED ORDER — ONDANSETRON HCL 4 MG/2ML IJ SOLN: 4.0000 mg | Freq: Four times a day (QID) | INTRAMUSCULAR | Status: DC | PRN

## 2023-09-01 MED ORDER — FENTANYL CITRATE PF 50 MCG/ML IJ SOSY
100.0000 ug | PREFILLED_SYRINGE | Freq: Once | INTRAMUSCULAR | Status: AC
  Administered 2023-09-01: 50 ug via INTRAVENOUS
  Filled 2023-09-01: qty 2

## 2023-09-01 MED ORDER — ONDANSETRON HCL 4 MG/2ML IJ SOLN: 4.0000 mg | Freq: Once | INTRAMUSCULAR | Status: DC | PRN

## 2023-09-01 MED ORDER — PROPOFOL 10 MG/ML IV BOLUS
INTRAVENOUS | Status: DC | PRN
  Administered 2023-09-01: 40 mg via INTRAVENOUS
  Administered 2023-09-01: 40 ug/kg/min via INTRAVENOUS

## 2023-09-01 MED ORDER — TRANEXAMIC ACID-NACL 1000-0.7 MG/100ML-% IV SOLN
1000.0000 mg | INTRAVENOUS | Status: AC
  Administered 2023-09-01: 1000 mg via INTRAVENOUS
  Filled 2023-09-01: qty 100

## 2023-09-01 MED ORDER — WATER FOR IRRIGATION, STERILE IR SOLN
Status: DC | PRN
  Administered 2023-09-01: 1000 mL

## 2023-09-01 MED ORDER — FENTANYL CITRATE (PF) 100 MCG/2ML IJ SOLN
INTRAMUSCULAR | Status: AC
  Filled 2023-09-01: qty 2

## 2023-09-01 MED ORDER — DEXAMETHASONE SODIUM PHOSPHATE 10 MG/ML IJ SOLN
INTRAMUSCULAR | Status: DC | PRN
Start: 2023-09-01 — End: 2023-09-01
  Administered 2023-09-01: 4 mg via INTRAVENOUS

## 2023-09-01 MED ORDER — LACTATED RINGERS IV SOLN: INTRAVENOUS | Status: DC

## 2023-09-01 MED ORDER — OXYCODONE HCL 5 MG PO TABS
10.0000 mg | ORAL_TABLET | ORAL | Status: DC | PRN
Start: 2023-09-01 — End: 2023-09-04
  Administered 2023-09-01: 15 mg via ORAL
  Administered 2023-09-02: 10 mg via ORAL
  Administered 2023-09-02: 15 mg via ORAL
  Administered 2023-09-03 – 2023-09-04 (×4): 10 mg via ORAL
  Filled 2023-09-01 (×4): qty 2
  Filled 2023-09-01: qty 3
  Filled 2023-09-01: qty 2
  Filled 2023-09-01: qty 3
  Filled 2023-09-01 (×2): qty 2

## 2023-09-01 MED ORDER — ACETAMINOPHEN 10 MG/ML IV SOLN: 1000.0000 mg | Freq: Once | INTRAVENOUS | Status: DC | PRN

## 2023-09-01 MED ORDER — FENTANYL CITRATE (PF) 100 MCG/2ML IJ SOLN
INTRAMUSCULAR | Status: DC | PRN
  Administered 2023-09-01: 25 ug via INTRAVENOUS

## 2023-09-01 MED ORDER — ONDANSETRON HCL 4 MG/2ML IJ SOLN
INTRAMUSCULAR | Status: DC | PRN
  Administered 2023-09-01: 4 mg via INTRAVENOUS

## 2023-09-01 MED ORDER — ACETAMINOPHEN 500 MG PO TABS
1000.0000 mg | ORAL_TABLET | Freq: Once | ORAL | Status: AC
  Administered 2023-09-01: 1000 mg via ORAL
  Filled 2023-09-01: qty 2

## 2023-09-01 MED ORDER — ROPIVACAINE HCL 5 MG/ML IJ SOLN
INTRAMUSCULAR | Status: DC | PRN
  Administered 2023-09-01: 30 mL via PERINEURAL

## 2023-09-01 MED ORDER — MIDAZOLAM HCL 2 MG/2ML IJ SOLN
2.0000 mg | Freq: Once | INTRAMUSCULAR | Status: AC
  Administered 2023-09-01: 2 mg via INTRAVENOUS
  Filled 2023-09-01: qty 2

## 2023-09-01 MED ORDER — ACETAMINOPHEN 500 MG PO TABS
1000.0000 mg | ORAL_TABLET | Freq: Four times a day (QID) | ORAL | Status: AC
Start: 2023-09-01 — End: 2023-09-02
  Administered 2023-09-01 – 2023-09-02 (×4): 1000 mg via ORAL
  Filled 2023-09-01 (×4): qty 2

## 2023-09-01 MED ORDER — MAGNESIUM CITRATE PO SOLN: 1.0000 | Freq: Once | ORAL | Status: DC | PRN

## 2023-09-01 MED ORDER — KETOROLAC TROMETHAMINE 30 MG/ML IJ SOLN
INTRAMUSCULAR | Status: DC | PRN
  Administered 2023-09-01: 30 mg

## 2023-09-01 MED ORDER — BISACODYL 10 MG RE SUPP: 10.0000 mg | Freq: Every day | RECTAL | Status: DC | PRN

## 2023-09-01 MED ORDER — ATENOLOL 50 MG PO TABS
100.0000 mg | ORAL_TABLET | Freq: Every evening | ORAL | Status: DC
  Administered 2023-09-01 – 2023-09-03 (×2): 100 mg via ORAL
  Filled 2023-09-01 (×3): qty 2

## 2023-09-01 MED ORDER — BUPIVACAINE HCL 0.25 % IJ SOLN
INTRAMUSCULAR | Status: DC | PRN
  Administered 2023-09-01: 30 mL

## 2023-09-01 MED ORDER — ASPIRIN 325 MG PO TBEC
325.0000 mg | DELAYED_RELEASE_TABLET | Freq: Every day | ORAL | Status: DC
Start: 2023-09-02 — End: 2023-09-04
  Administered 2023-09-02 – 2023-09-04 (×3): 325 mg via ORAL
  Filled 2023-09-01 (×3): qty 1

## 2023-09-01 MED ORDER — DIPHENHYDRAMINE HCL 12.5 MG/5ML PO ELIX: 12.5000 mg | ORAL_SOLUTION | ORAL | Status: DC | PRN

## 2023-09-01 MED ORDER — METFORMIN HCL 500 MG PO TABS
500.0000 mg | ORAL_TABLET | Freq: Every day | ORAL | Status: DC
  Administered 2023-09-01 – 2023-09-03 (×3): 500 mg via ORAL
  Filled 2023-09-01 (×3): qty 1

## 2023-09-01 MED ORDER — LEVOTHYROXINE SODIUM 100 MCG PO TABS
100.0000 ug | ORAL_TABLET | Freq: Every day | ORAL | Status: DC
  Administered 2023-09-02 – 2023-09-04 (×3): 100 ug via ORAL
  Filled 2023-09-01 (×3): qty 1

## 2023-09-01 MED ORDER — OXYCODONE HCL 5 MG PO TABS: 5.0000 mg | ORAL_TABLET | Freq: Once | ORAL | Status: DC | PRN

## 2023-09-01 MED ORDER — OXYCODONE HCL 5 MG PO TABS
5.0000 mg | ORAL_TABLET | ORAL | Status: DC | PRN
  Administered 2023-09-01 – 2023-09-02 (×2): 5 mg via ORAL
  Administered 2023-09-03 – 2023-09-04 (×3): 10 mg via ORAL
  Administered 2023-09-04: 5 mg via ORAL
  Filled 2023-09-01 (×4): qty 2
  Filled 2023-09-01: qty 1

## 2023-09-01 MED ORDER — METHOCARBAMOL 1000 MG/10ML IJ SOLN: 500.0000 mg | Freq: Four times a day (QID) | INTRAMUSCULAR | Status: DC | PRN

## 2023-09-01 MED ORDER — POLYETHYLENE GLYCOL 3350 17 G PO PACK: 17.0000 g | PACK | Freq: Every day | ORAL | Status: DC | PRN

## 2023-09-01 MED ORDER — CEFAZOLIN SODIUM-DEXTROSE 2-4 GM/100ML-% IV SOLN
2.0000 g | INTRAVENOUS | Status: AC
  Administered 2023-09-01: 2 g via INTRAVENOUS
  Filled 2023-09-01: qty 100

## 2023-09-01 MED ORDER — ORAL CARE MOUTH RINSE: 15.0000 mL | Freq: Once | OROMUCOSAL | Status: AC

## 2023-09-01 MED ORDER — BUPIVACAINE IN DEXTROSE 0.75-8.25 % IT SOLN
INTRATHECAL | Status: DC | PRN
  Administered 2023-09-01: 1.6 mL via INTRATHECAL

## 2023-09-01 MED ORDER — OXYCODONE HCL 5 MG/5ML PO SOLN: 5.0000 mg | Freq: Once | ORAL | Status: DC | PRN

## 2023-09-01 MED ORDER — ROSUVASTATIN CALCIUM 20 MG PO TABS
40.0000 mg | ORAL_TABLET | Freq: Every day | ORAL | Status: DC
  Administered 2023-09-01 – 2023-09-03 (×3): 40 mg via ORAL
  Filled 2023-09-01 (×3): qty 2

## 2023-09-01 MED ORDER — ONDANSETRON HCL 4 MG/2ML IJ SOLN
INTRAMUSCULAR | Status: AC
  Filled 2023-09-01: qty 2

## 2023-09-01 MED ORDER — METOCLOPRAMIDE HCL 5 MG/ML IJ SOLN: 5.0000 mg | Freq: Three times a day (TID) | INTRAMUSCULAR | Status: DC | PRN

## 2023-09-01 MED ORDER — DAPAGLIFLOZIN PROPANEDIOL 10 MG PO TABS
10.0000 mg | ORAL_TABLET | Freq: Every day | ORAL | Status: DC
  Administered 2023-09-02 – 2023-09-04 (×3): 10 mg via ORAL
  Filled 2023-09-01 (×3): qty 1

## 2023-09-01 MED ORDER — KETOROLAC TROMETHAMINE 30 MG/ML IJ SOLN
INTRAMUSCULAR | Status: AC
  Filled 2023-09-01: qty 1

## 2023-09-01 SURGICAL SUPPLY — 49 items
ATTUNE PS FEM RT SZ 5 CEM KNEE (Femur) IMPLANT
BAG COUNTER SPONGE SURGICOUNT (BAG) IMPLANT
BAG ZIPLOCK 12X15 (MISCELLANEOUS) IMPLANT
BASEPLATE TIB CMT FB PCKT SZ5 (Knees) IMPLANT
BLADE SAG 18X100X1.27 (BLADE) ×1 IMPLANT
BLADE SAW SGTL 11.0X1.19X90.0M (BLADE) IMPLANT
BLADE SAW SGTL 13X75X1.27 (BLADE) ×1 IMPLANT
BLADE SURG 15 STRL LF DISP TIS (BLADE) ×1 IMPLANT
BNDG ELASTIC 6X10 VLCR STRL LF (GAUZE/BANDAGES/DRESSINGS) ×1 IMPLANT
BOWL SMART MIX CTS (DISPOSABLE) ×1 IMPLANT
CEMENT HV SMART SET (Cement) ×2 IMPLANT
CLSR STERI-STRIP ANTIMIC 1/2X4 (GAUZE/BANDAGES/DRESSINGS) ×2 IMPLANT
COVER SURGICAL LIGHT HANDLE (MISCELLANEOUS) ×1 IMPLANT
CUFF TRNQT CYL 34X4.125X (TOURNIQUET CUFF) ×1 IMPLANT
DRAPE SHEET LG 3/4 BI-LAMINATE (DRAPES) ×1 IMPLANT
DRAPE U-SHAPE 47X51 STRL (DRAPES) ×1 IMPLANT
DRSG MEPILEX POST OP 4X12 (GAUZE/BANDAGES/DRESSINGS) ×1 IMPLANT
DURAPREP 26ML APPLICATOR (WOUND CARE) ×2 IMPLANT
ELECT PENCIL ROCKER SW 15FT (MISCELLANEOUS) ×1 IMPLANT
ELECT REM PT RETURN 15FT ADLT (MISCELLANEOUS) ×1 IMPLANT
GAUZE PAD ABD 8X10 STRL (GAUZE/BANDAGES/DRESSINGS) ×2 IMPLANT
GLOVE BIO SURGEON STRL SZ 6.5 (GLOVE) ×1 IMPLANT
GLOVE BIO SURGEON STRL SZ7.5 (GLOVE) ×1 IMPLANT
GLOVE BIOGEL PI IND STRL 7.0 (GLOVE) ×1 IMPLANT
GLOVE BIOGEL PI IND STRL 8 (GLOVE) ×1 IMPLANT
GOWN STRL SURGICAL XL XLNG (GOWN DISPOSABLE) ×2 IMPLANT
HOLDER FOLEY CATH W/STRAP (MISCELLANEOUS) ×1 IMPLANT
HOOD PEEL AWAY T7 (MISCELLANEOUS) ×3 IMPLANT
IMMOBILIZER KNEE 20 (SOFTGOODS) ×1 IMPLANT
IMMOBILIZER KNEE 20 THIGH 36 (SOFTGOODS) ×1 IMPLANT
INSERT TIB FIX BEARNG SZ 5 5MM (Insert) IMPLANT
KIT TURNOVER KIT A (KITS) ×1 IMPLANT
MANIFOLD NEPTUNE II (INSTRUMENTS) ×1 IMPLANT
NS IRRIG 1000ML POUR BTL (IV SOLUTION) ×1 IMPLANT
PACK TOTAL KNEE CUSTOM (KITS) ×1 IMPLANT
PATELLA MEDIAL ATTUN 35MM KNEE (Knees) IMPLANT
PIN STEINMAN FIXATION KNEE (PIN) IMPLANT
PROTECTOR NERVE ULNAR (MISCELLANEOUS) ×1 IMPLANT
SET HNDPC FAN SPRY TIP SCT (DISPOSABLE) ×1 IMPLANT
SET PAD KNEE POSITIONER (MISCELLANEOUS) ×1 IMPLANT
SPIKE FLUID TRANSFER (MISCELLANEOUS) IMPLANT
SUT MNCRL AB 3-0 PS2 18 (SUTURE) IMPLANT
SUT STRATAFIX PDS+ 0 24IN (SUTURE) ×1 IMPLANT
SUT VIC AB 2-0 CT1 TAPERPNT 27 (SUTURE) ×2 IMPLANT
SUT VIC AB 3-0 SH 27X BRD (SUTURE) ×2 IMPLANT
TRAY FOLEY MTR SLVR 16FR STAT (SET/KITS/TRAYS/PACK) ×1 IMPLANT
TUBE SUCTION HIGH CAP CLEAR NV (SUCTIONS) ×1 IMPLANT
WATER STERILE IRR 1000ML POUR (IV SOLUTION) ×2 IMPLANT
WRAP KNEE MAXI GEL POST OP (GAUZE/BANDAGES/DRESSINGS) ×1 IMPLANT

## 2023-09-01 NOTE — Anesthesia Procedure Notes (Signed)
 Procedure Name: MAC Date/Time: 09/01/2023 1:37 PM  Performed by: Alwyn Juba, CRNAPre-anesthesia Checklist: Patient identified, Emergency Drugs available, Suction available, Patient being monitored and Timeout performed Oxygen Delivery Method: Simple face mask Placement Confirmation: positive ETCO2

## 2023-09-01 NOTE — Anesthesia Procedure Notes (Signed)
 Spinal  Patient location during procedure: OR Start time: 09/01/2023 1:42 PM Reason for block: surgical anesthesia Staffing Performed: resident/CRNA  Anesthesiologist: Rosalita Combe, MD Resident/CRNA: Alwyn Juba, CRNA Performed by: Alwyn Juba, CRNA Authorized by: Rosalita Combe, MD   Preanesthetic Checklist Completed: patient identified, IV checked, site marked, risks and benefits discussed, surgical consent, monitors and equipment checked, pre-op evaluation and timeout performed Spinal Block Patient position: sitting Prep: DuraPrep and site prepped and draped Patient monitoring: heart rate, cardiac monitor, continuous pulse ox and blood pressure Approach: midline Location: L3-4 Injection technique: single-shot Needle Needle type: Pencan  Needle gauge: 24 G Needle length: 10 cm Assessment Events: CSF return Additional Notes Pt placed in sitting position, spinal kit expiration date checked and verified, + CSF, - heme, pt tolerated well. Dr Lasalle Pointer present and supervising throughout SAB placement. Adequate sensory level.

## 2023-09-01 NOTE — Transfer of Care (Signed)
 Immediate Anesthesia Transfer of Care Note  Patient: Kathryn Castillo  Procedure(s) Performed: ARTHROPLASTY, KNEE, TOTAL (Right: Knee)  Patient Location: PACU  Anesthesia Type:MAC, Regional, and Spinal  Level of Consciousness: awake and alert   Airway & Oxygen Therapy: Patient Spontanous Breathing and Patient connected to face mask oxygen  Post-op Assessment: Report given to RN and Post -op Vital signs reviewed and stable  Post vital signs: Reviewed and stable  Last Vitals:  Vitals Value Taken Time  BP 101/66 09/01/23 1612  Temp    Pulse 73 09/01/23 1614  Resp 18 09/01/23 1614  SpO2 100 % 09/01/23 1614  Vitals shown include unfiled device data.  Last Pain:  Vitals:   09/01/23 1134  TempSrc: Oral  PainSc:          Complications: No notable events documented.

## 2023-09-01 NOTE — Op Note (Signed)
 DATE OF SURGERY:  09/01/2023 TIME: 3:40 PM  PATIENT NAME:  Kathryn Castillo   AGE: 73 y.o.    PRE-OPERATIVE DIAGNOSIS: Right knee primary localized osteoarthritis  POST-OPERATIVE DIAGNOSIS:  Same  PROCEDURE: Right total Knee Arthroplasty  SURGEON:  Neville Barbone, MD   ASSISTANT:  Hurshel Maidens, PA-C, present and scrubbed throughout the case, critical for assistance with exposure, retraction, instrumentation, and closure.  OPERATIVE IMPLANTS: Implant Name: CEMENT HV SMART SET - [id number] Type: Cement Inv. Item: CEMENT HV SMART SET Serial No.:  Manufacturer: [employer] Lot No.: [id number] LRB: Right No. Used: 1 Action: Implanted   Implant Name: CEMENT HV SMART SET - [id number] Type: Cement Inv. Item: CEMENT HV SMART SET Serial No.:  Manufacturer: [employer] Lot No.: [id number] LRB: Right No. Used: 1 Action: Implanted   Implant Name: PATELLA MEDIAL ATTUN KNEE - [id number] Type: Knees Inv. Item: PATELLA MEDIAL ATTUN KNEE Serial No.:  Manufacturer: [employer] Lot No.: [id number] LRB: Right No. Used: 1 Action: Implanted   Implant Name: ATTUNE PS FEM RT SZ 5 CEM KNEE - [id number] Type: Femur Inv. Item: ATTUNE PS FEM RT SZ 5 CEM KNEE Serial No.:  Manufacturer: [employer] Lot No.: [id number] LRB: Right No. Used: 1 Action: Implanted   Implant Name: BASEPLATE TIB CMT FB PCKT SZ5 - [id number] Type: Knees Inv. Item: BASEPLATE TIB CMT FB PCKT SZ5 Serial No.:  Manufacturer: [employer] Lot No.: [id number] LRB: Right No. Used: 1 Action: Implanted   Implant Name: INSERT TIB FIX BEARNG SZ 5 - [id number] Type: Insert Inv. Item: INSERT TIB FIX BEARNG SZ 5 Serial No.:  Manufacturer: [employer] Lot No.: [id number] LRB: Right No. Used: 1 Action: Implanted   PREOPERATIVE INDICATIONS:  Kathryn Castillo is a 73 y.o. year old female with end stage bone on bone degenerative arthritis of the knee  who failed conservative treatment, including injections, antiinflammatories, activity modification, and assistive devices, and had significant impairment of their activities of daily living, and elected for Total Knee Arthroplasty.   The risks, benefits, and alternatives were discussed at length including but not limited to the risks of infection, bleeding, nerve injury, stiffness, blood clots, the need for revision surgery, cardiopulmonary complications, among others, and they were willing to proceed.  OPERATIVE FINDINGS AND UNIQUE ASPECTS OF THE CASE: She had severe eburnation of the lateral facet of the trochlea as well as diffusely across the patella.  She had a tendency towards valgus alignment, with more lateral degenerative changes than medial.  I struggled to assess the slope given her body habitus, ultimately the patella tracked well.  I debated between a size 5 and a size 4 on the femur, but went with a size 5.  Similarly, I debated on the tibia, although I wanted to have complete cortical coverage given her body habitus, so I went with a size 5.  ESTIMATED BLOOD LOSS: 100 mL  OPERATIVE DESCRIPTION:  The patient was brought to the operative room and placed in a supine position.  Anesthesia was administered.  IV antibiotics were given.  The lower extremity was prepped and draped in the usual sterile fashion.  Time out was performed.  The leg was elevated and exsanguinated and the tourniquet was inflated.  Anterior quadriceps tendon splitting approach was performed.  The patella was everted and osteophytes were removed.  The anterior horn of the medial and lateral meniscus was removed.   The patella was then measured, and cut with the saw.  The thickness before the cut was 21 and after the cut was 14.  A metal shield was used to protect the patella throughout the case.    The distal femur was opened with the drill and the intramedullary distal femoral cutting jig was utilized, set at 5  degrees resecting 9 mm off the distal femur.  Care was taken to protect the collateral ligaments.  Then the extramedullary tibial cutting jig was utilized making the appropriate cut using the anterior tibial crest as a reference building in appropriate posterior slope.  Care was taken during the cut to protect the medial and collateral ligaments.  The proximal tibia was removed along with the posterior horns of the menisci.  The PCL was sacrificed.    The extensor gap was measured and found to have adequate resection, measuring to a size 5.    The distal femoral sizing jig was applied, taking care to avoid notching.  This was set at 3 degrees of external rotation.  Then the 4-in-1 cutting jig was applied and the anterior and posterior femur was cut, along with the chamfer cuts.  All posterior osteophytes were removed.  The flexion gap was then measured and was symmetric with the extension gap.  I completed the distal femoral preparation using the appropriate jig to prepare the box.  The proximal tibia sized and prepared accordingly with the reamer and the punch, and then all components were trialed with the poly insert.  The knee was found to have excellent balance and full motion.    The above named components were then cemented into place and all excess cement was removed.  The real polyethylene implant was placed.  After the cement had cured I released the tourniquet and confirmed excellent hemostasis with no major posterior vessel injury.    The knee was easily taken through a range of motion and the patella tracked well and the knee irrigated copiously and the parapatellar tissue closed with Stratafix and vicryl, and subcutaneous tissue closed with vicryl, and monocryl with steri strips for the skin.  The wounds were injected with marcaine , and dressed with sterile gauze and the patient was awakened and returned to the PACU in stable and satisfactory condition.  There were no  complications.  Total tourniquet time was 75 minutes.

## 2023-09-01 NOTE — Anesthesia Procedure Notes (Signed)
 Anesthesia Regional Block: Adductor canal block   Pre-Anesthetic Checklist: , timeout performed,  Correct Patient, Correct Site, Correct Laterality,  Correct Procedure, Correct Position, site marked,  Risks and benefits discussed,  Surgical consent,  Pre-op evaluation,  At surgeon's request and post-op pain management  Laterality: Lower and Right  Prep: chloraprep       Needles:  Injection technique: Single-shot  Needle Type: Echogenic Needle     Needle Length: 9cm  Needle Gauge: 22     Additional Needles:   Procedures:,,,, ultrasound used (permanent image in chart),,    Narrative:  Start time: 09/01/2023 1:20 PM End time: 09/01/2023 1:26 PM Injection made incrementally with aspirations every 5 mL.  Performed by: Personally  Anesthesiologist: Rosalita Combe, MD  Additional Notes: Block assessed prior to surgery. Pt tolerated procedure well.

## 2023-09-01 NOTE — Plan of Care (Signed)

## 2023-09-01 NOTE — Interval H&P Note (Signed)
 History and Physical Interval Note:  09/01/2023 11:32 AM  Kathryn Castillo  has presented today for surgery, with the diagnosis of right knee OA.  The various methods of treatment have been discussed with the patient and family. After consideration of risks, benefits and other options for treatment, the patient has consented to  Procedure(s): ARTHROPLASTY, KNEE, TOTAL (Right) as a surgical intervention.  The patient's history has been reviewed, patient examined, no change in status, stable for surgery.  I have reviewed the patient's chart and labs.  Questions were answered to the patient's satisfaction.     Neville Barbone

## 2023-09-01 NOTE — Discharge Instructions (Signed)

## 2023-09-01 NOTE — Anesthesia Postprocedure Evaluation (Signed)
 Anesthesia Post Note  Patient: Kathryn Castillo  Procedure(s) Performed: ARTHROPLASTY, KNEE, TOTAL (Right: Knee)     Patient location during evaluation: PACU Anesthesia Type: Regional, Spinal and MAC Level of consciousness: awake Pain management: pain level controlled Vital Signs Assessment: post-procedure vital signs reviewed and stable Respiratory status: spontaneous breathing, respiratory function stable and nonlabored ventilation Cardiovascular status: blood pressure returned to baseline and stable Postop Assessment: no headache, no backache and no apparent nausea or vomiting Anesthetic complications: no   No notable events documented.  Last Vitals:  Vitals:   09/01/23 1330 09/01/23 1615  BP: (!) 135/53   Pulse: 64   Resp: 15   Temp:  36.4 C  SpO2: 96%     Last Pain:  Vitals:   09/01/23 1134  TempSrc: Oral  PainSc:                  [name]

## 2023-09-02 ENCOUNTER — Encounter (HOSPITAL_COMMUNITY): Payer: Self-pay | Admitting: Orthopedic Surgery

## 2023-09-02 DIAGNOSIS — M1711 Unilateral primary osteoarthritis, right knee: Secondary | ICD-10-CM | POA: Diagnosis not present

## 2023-09-02 LAB — CBC
HCT: 39.9 % (ref 36.0–46.0)
Hemoglobin: 13 g/dL (ref 12.0–15.0)
MCH: 28 pg (ref 26.0–34.0)
MCHC: 32.6 g/dL (ref 30.0–36.0)
MCV: 86 fL (ref 80.0–100.0)
Platelets: 226 10*3/uL (ref 150–400)
RBC: 4.64 MIL/uL (ref 3.87–5.11)
RDW: 13.4 % (ref 11.5–15.5)
WBC: 10 10*3/uL (ref 4.0–10.5)
nRBC: 0 % (ref 0.0–0.2)

## 2023-09-02 LAB — BASIC METABOLIC PANEL WITH GFR
Anion gap: 8 (ref 5–15)
BUN: 15 mg/dL (ref 8–23)
CO2: 24 mmol/L (ref 22–32)
Calcium: 9.2 mg/dL (ref 8.9–10.3)
Chloride: 105 mmol/L (ref 98–111)
Creatinine, Ser: 0.72 mg/dL (ref 0.44–1.00)
GFR, Estimated: >60 mL/min (ref >60)
Glucose, Bld: 129 mg/dL — ABNORMAL HIGH (ref 70–99)
Potassium: 4.4 mmol/L (ref 3.5–5.1)
Sodium: 137 mmol/L (ref 135–145)

## 2023-09-02 NOTE — Progress Notes (Signed)
 Physical Therapy Treatment Patient Details Name: Kathryn Castillo MRN: [id number] DOB: 1951-04-02 Today's Date: 09/02/2023   History of Present Illness Kathryn Castillo is a 73 yo female s/p R TKA 5/13. 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 Pt reports 4 steps to enter home with single R handrail (ascending); once in the home pt has single step up from den to kitchen then another single step up from kitchen to rest of home. Pt amb 150 ft with step to to step through gait pattern, no knee buckling noted bilaterally, slow cadence, recliner follow for seated rest break if needed. Completed stair training this date, pt needing min A+2 for safety to min A+2 to physically prevent a fall. Heavy education on sequencing (LLE leading to ascend, RLE leading to descend). Pt prefers to descend steps backwards with bil hands on handrail, leading with LLE. Pt will have family/friend support at home who can only perform supv for mobility. Plan to continue progressing stair training as able to progress home with family support. Pt reports has HHPT arranged.   If plan is discharge home, recommend the following: A little help with walking and/or transfers;A little help with bathing/dressing/bathroom;Assistance with cooking/housework;Assist for transportation;Help with stairs or ramp for entrance   Can travel by private vehicle        Equipment Recommendations  None recommended by PT (RW delivered to room)    Recommendations for Other Services       Precautions / Restrictions Precautions Precautions: Fall;Knee Recall of Precautions/Restrictions: Intact Restrictions Weight Bearing Restrictions Per Provider Order: No     Mobility  Bed Mobility Overal bed mobility: Needs Assistance Bed Mobility: Supine to Sit, Sit to Supine     Supine to sit: Contact guard, HOB elevated, Used rails Sit  to supine: Contact guard assist, HOB elevated, Used rails   General bed mobility comments: increased time and effort, using bedrail and gait belt to self assist RLE out of bed and back into bed    Transfers Overall transfer level: Needs assistance Equipment used: Rolling walker (2 wheels) Transfers: Sit to/from Stand Sit to Stand: Contact guard assist           General transfer comment: verbal cues for hand placement, RLE slightly forward for comfort    Ambulation/Gait Ambulation/Gait assistance: Supervision Gait Distance (Feet): 150 Feet Assistive device: Rolling walker (2 wheels) Gait Pattern/deviations: Step-to pattern, Step-through pattern, Decreased stride length Gait velocity: decreased     General Gait Details: step-to gait pattern progressing to step-through gait pattern, no knee buckling noted bilaterally, decreased cadence, RW for steadying support   Stairs Stairs: Yes Stairs assistance: Min assist, +2 physical assistance, +2 safety/equipment Stair Management: One rail Right Number of Stairs: 4 General stair comments: step to gait pattern with single R handrail (Ascending), performs first 2 steps with LLE leading despite pt's fear of falling, min A+2 for safety, pt requests to attempt with RLE leading requring min A+2 to prevent fall; descending steps pt uses same single R handrail (Ascending) and steps down posterior leading with LLE and heavy UE support on handrail with min A+2 for assist and safety   Wheelchair Mobility     Tilt Bed    Modified Rankin (Stroke Patients Only)       Balance Overall balance assessment: Needs assistance Sitting-balance support: Feet supported Sitting balance-Leahy Scale: Good     Standing balance support:  Reliant on assistive device for balance, During functional activity, Bilateral upper extremity supported Standing balance-Leahy Scale: Poor                              Communication  Communication Communication: No apparent difficulties  Cognition Arousal: Alert Behavior During Therapy: WFL for tasks assessed/performed   PT - Cognitive impairments: No apparent impairments                         Following commands: Intact      Cueing    Exercises Total Joint Exercises Ankle Circles/Pumps: AROM, Both, 10 reps, Supine Quad Sets: AROM, Right, 5 reps, Supine Straight Leg Raises: AROM, Both, Supine, Right (3 reps) Long Arc Quad: AROM, Both, 5 reps, Seated    General Comments        Pertinent Vitals/Pain Pain Assessment Pain Assessment: 0-10 Pain Score: 6  Pain Location: R knee Pain Descriptors / Indicators: Grimacing, Guarding Pain Intervention(s): Limited activity within patient's tolerance, Monitored during session, Repositioned, Ice applied    Home Living                          Prior Function            PT Goals (current goals can now be found in the care plan section) Acute Rehab PT Goals Patient Stated Goal: regain strength and independence PT Goal Formulation: With patient/family Time For Goal Achievement: 09/16/23 Potential to Achieve Goals: Good Progress towards PT goals: Progressing toward goals    Frequency    7X/week      PT Plan      Co-evaluation              AM-PAC PT "6 Clicks" Mobility   Outcome Measure  Help needed turning from your back to your side while in a flat bed without using bedrails?: A Little Help needed moving from lying on your back to sitting on the side of a flat bed without using bedrails?: A Little Help needed moving to and from a bed to a chair (including a wheelchair)?: A Little Help needed standing up from a chair using your arms (e.g., wheelchair or bedside chair)?: A Little Help needed to walk in hospital room?: A Little Help needed climbing 3-5 steps with a railing? : Total 6 Click Score: 16    End of Session Equipment Utilized During Treatment: Gait belt Activity  Tolerance: Patient tolerated treatment well Patient left: in bed;with call bell/phone within reach;with bed alarm set;with family/visitor present Nurse Communication: Mobility status PT Visit Diagnosis: Other abnormalities of gait and mobility (R26.89);Muscle weakness (generalized) (M62.81);Pain Pain - Right/Left: Right Pain - part of body: Knee     Time: 8657-8469 PT Time Calculation (min) (ACUTE ONLY): 30 min  Charges:    $Gait Training: 23-37 mins PT General Charges $$ ACUTE PT VISIT: 1 Visit                     Tori Brier Firebaugh PT, DPT 09/02/23, 3:03 PM

## 2023-09-02 NOTE — Plan of Care (Signed)
   Problem: Education: Goal: Knowledge of General Education information will improve Description: Including pain rating scale, medication(s)/side effects and non-pharmacologic comfort measures Outcome: Progressing   Problem: Health Behavior/Discharge Planning: Goal: Ability to manage health-related needs will improve Outcome: Progressing   Problem: Activity: Goal: Risk for activity intolerance will decrease Outcome: Progressing

## 2023-09-02 NOTE — Progress Notes (Signed)
 Subjective: 1 Day Post-Op s/p Procedure(s): ARTHROPLASTY, KNEE, TOTAL   Patient is alert, oriented. Patient reports pain as so far well controlled.  Denies chest pain, SOB, Calf pain. No nausea/vomiting. No other complaints.     Objective:  PE: VITALS:   Vitals:   09/01/23 2059 09/02/23 0000 09/02/23 0105 09/02/23 0508  BP: (!) 141/65  (!) 127/47 (!) 110/50  Pulse: 81  67 60  Resp: 16 16 17 17   Temp: 97.6 F (36.4 C)  98.8 F (37.1 C) 98 F (36.7 C)  TempSrc: Oral  Oral Oral  SpO2: 98% 98% 99% 99%  Weight:      Height:        Sensation intact distally Intact pulses distally Dorsiflexion/Plantar flexion intact Incision: dressing C/D/I  LABS  Results for orders placed or performed during the hospital encounter of 09/01/23 (from the past 24 hours)  Glucose, capillary     Status: None   Collection Time: 09/01/23 11:38 AM  Result Value Ref Range   Glucose-Capillary 95 70 - 99 mg/dL  Glucose, capillary     Status: Abnormal   Collection Time: 09/01/23  4:16 PM  Result Value Ref Range   Glucose-Capillary 114 (H) 70 - 99 mg/dL   Comment 1 Notify RN    Comment 2 Document in Chart   CBC     Status: None   Collection Time: 09/02/23  3:42 AM  Result Value Ref Range   WBC 10.0 4.0 - 10.5 K/uL   RBC 4.64 3.87 - 5.11 MIL/uL   Hemoglobin 13.0 12.0 - 15.0 g/dL   HCT 16.1 09.6 - 04.5 %   MCV 86.0 80.0 - 100.0 fL   MCH 28.0 26.0 - 34.0 pg   MCHC 32.6 30.0 - 36.0 g/dL   RDW 40.9 81.1 - 91.4 %   Platelets 226 150 - 400 K/uL   nRBC 0.0 0.0 - 0.2 %  Basic metabolic panel     Status: Abnormal   Collection Time: 09/02/23  3:42 AM  Result Value Ref Range   Sodium 137 135 - 145 mmol/L   Potassium 4.4 3.5 - 5.1 mmol/L   Chloride 105 98 - 111 mmol/L   CO2 24 22 - 32 mmol/L   Glucose, Bld 129 (H) 70 - 99 mg/dL   BUN 15 8 - 23 mg/dL   Creatinine, Ser 7.82 0.44 - 1.00 mg/dL   Calcium  9.2 8.9 - 10.3 mg/dL   GFR, Estimated >95 >62 mL/min   Anion gap 8 5 - 15    DG Knee  Right Port Result Date: 09/01/2023 CLINICAL DATA:  Status post right knee replacement. EXAM: PORTABLE RIGHT KNEE - 1-2 VIEW COMPARISON:  None Available. FINDINGS: Right knee arthroplasty in expected alignment. No periprosthetic lucency or fracture. There has been patellar resurfacing. Recent postsurgical change includes air and edema in the soft tissues and joint space. IMPRESSION: Right knee arthroplasty without immediate postoperative complication. Electronically Signed   By: Chadwick Colonel M.D.   On: 09/01/2023 17:45    Assessment/Plan: Principal Problem:   S/P TKR (total knee replacement), right    1 Day Post-Op s/p Procedure(s): ARTHROPLASTY, KNEE, TOTAL  Weightbearing: WBAT RLE Insicional and dressing care: Reinforce dressings as needed VTE prophylaxis: Aspirin  325mg  BID x 30 days Pain control: continue current regimen Follow - up plan: 2 weeks w/ Dr. Agatha Horsfall Dispo: pending PT/OT evals today   Contact information:   Hurshel Maidens, PA-C Weekdays 8-5  After hours and holidays please check  [email or web address] for group call information for [facility]  Abraham Hoffmann 09/02/2023, 8:37 AM

## 2023-09-02 NOTE — Care Management Obs Status (Signed)
 MEDICARE OBSERVATION STATUS NOTIFICATION   Patient Details  Name: Kathryn Castillo MRN: [id number] Date of Birth: 01-Oct-1950   Medicare Observation Status Notification Given:  Yes    Bari Leys, RN 09/02/2023, 9:29 AM

## 2023-09-02 NOTE — Evaluation (Signed)
 Physical Therapy Evaluation Patient Details Name: Kathryn Castillo MRN: [id number] DOB: 1950/05/02 Today's Date: 09/02/2023  History of Present Illness  [name] is a 73 yo female s/p R TKA 5/13. 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 On eval, pt needing verbal cues for bed mobility, CGA to mobilize to EOB. Pt powers to stand with CGA, slow to power up, cues for hand placement. Pt amb 150 ft with RW, CGA initially progressing to supv, pt with step-to pattern for first couple of steps but quickly progresses to step-through pattern with minimal BUE support on RW. Upon return to room, reviewed HEP and answered questions. Plan to progress stair training as able, pt verbalizes anxiousness regarding stair training due to multiple steps at home. Pt reports plan is to return home with family support, will have HHPT then transition to OPPT. Pt will benefit from acute skilled PT to increase their independence and safety with mobility to allow discharge.          If plan is discharge home, recommend the following: A little help with walking and/or transfers;A little help with bathing/dressing/bathroom;Assistance with cooking/housework;Assist for transportation;Help with stairs or ramp for entrance   Can travel by private vehicle        Equipment Recommendations None recommended by PT (RW delivered to room)  Recommendations for Other Services       Functional Status Assessment Patient has had a recent decline in their functional status and demonstrates the ability to make  significant improvements in function in a reasonable and predictable amount of time.     Precautions / Restrictions Precautions Precautions: Fall;Knee Recall of Precautions/Restrictions: Intact Restrictions Weight Bearing Restrictions Per Provider Order: No      Mobility  Bed Mobility Overal bed mobility: Needs Assistance Bed Mobility: Supine to Sit     Supine to sit: Contact guard, HOB elevated, Used rails     General bed mobility comments: self assisting RLE to EOB, using bedrails and elevated HOB to also upright trunk and scoot forward    Transfers Overall transfer level: Needs assistance Equipment used: Rolling walker (2 wheels) Transfers: Sit to/from Stand Sit to Stand: Contact guard assist           General transfer comment: CGA to power up to standing, verbal cues for hand placement    Ambulation/Gait Ambulation/Gait assistance: Contact guard assist, Supervision Gait Distance (Feet): 150 Feet Assistive device: Rolling walker (2 wheels) Gait Pattern/deviations: Step-to pattern, Decreased stride length Gait velocity: decreased     General Gait Details: step-to gait pattern for ~4 steps then quickly progressed to step through gait pattern, equal bil step length and weightbearing, pt reports L knee instability at baseline due to lacking patella, no knee buckling on either side noted, minimal UE weightbearing on RW  Stairs            Wheelchair Mobility     Tilt Bed    Modified Rankin (Stroke Patients Only)       Balance Overall balance assessment: Needs assistance Sitting-balance support: Feet supported Sitting balance-Leahy Scale: Good  Standing balance support: Reliant on assistive device for balance, During functional activity, Bilateral upper extremity supported Standing balance-Leahy Scale: Poor                               Pertinent Vitals/Pain Pain Assessment Pain Assessment: 0-10 Pain Score: 6  Pain Location: R  knee Pain Descriptors / Indicators: Grimacing, Guarding Pain Intervention(s): Limited activity within patient's tolerance, Monitored during session, Repositioned, Patient requesting pain meds-RN notified    Home Living Family/patient expects to be discharged to:: Private residence Living Arrangements: Alone Available Help at Discharge: Family Type of Home: House Home Access: Stairs to enter Entrance Stairs-Rails: Right Entrance Stairs-Number of Steps: 4 steps with R handrail at back porch (2 steps on front with bil handrails on the front but pt doesn't use this entrance) Alternate Level Stairs-Number of Steps: 1 step up into kitchen, 1 step into den that leads to bathroom Home Layout: Multi-level Home Equipment: BSC/3in1;Cane - single point;Hand held shower head;Shower seat - built in Additional Comments: family will be staying with her for ~2 weeks post-op to provide household chore support, no physical assitance    Prior Function               Mobility Comments: using SPC intermittently for pain control ADLs Comments: pt ind     Extremity/Trunk Assessment   Upper Extremity Assessment Upper Extremity Assessment: Overall WFL for tasks assessed    Lower Extremity Assessment Lower Extremity Assessment: RLE deficits/detail;LLE deficits/detail RLE Deficits / Details: AROM WFL, able to perform ankle pumps, quad set, SLR without extensor lag, and full LAQ RLE Sensation: WNL RLE Coordination: WNL LLE Deficits / Details: lacking patella, AROM WFL LLE Sensation: WNL LLE Coordination: WNL    Cervical / Trunk Assessment Cervical / Trunk Assessment: Normal  Communication   Communication Communication: No apparent difficulties    Cognition Arousal: Alert Behavior During Therapy: WFL for tasks assessed/performed   PT - Cognitive impairments: No apparent impairments                         Following commands: Intact       Cueing       General Comments       Exercises Total Joint Exercises Ankle Circles/Pumps: AROM, Both, 10 reps, Supine Straight Leg Raises: AROM, Both, Supine, Right (3 reps) Long Arc Quad: AROM, Both, 5 reps, Seated   Assessment/Plan    PT Assessment Patient needs continued PT services  PT Problem List Decreased strength;Decreased range of motion;Decreased activity tolerance;Decreased balance;Decreased knowledge of use of DME;Decreased safety awareness;Pain       PT Treatment Interventions DME instruction;Gait training;Stair training;Functional mobility training;Therapeutic activities;Therapeutic exercise;Balance training;Cognitive remediation;Patient/family education;Manual techniques    PT Goals (Current goals can be found in the Care Plan section)  Acute Rehab PT Goals Patient Stated Goal: regain strength and independence PT Goal Formulation: With patient/family Time For Goal Achievement: 09/16/23 Potential to Achieve Goals: Good    Frequency 7X/week     Co-evaluation               AM-PAC PT "6 Clicks" Mobility  Outcome Measure Help needed turning from your back to your side while in a flat bed without using bedrails?: A Little Help needed moving from lying on your back to sitting on the side of a flat bed without using bedrails?: A Little Help needed moving to and from a bed  to a chair (including a wheelchair)?: A Little Help needed standing up from a chair using your arms (e.g., wheelchair or bedside chair)?: A Little Help needed to walk in hospital room?: A Little Help needed climbing 3-5 steps with a railing? : A Lot 6 Click Score: 17    End of Session Equipment Utilized During Treatment: Gait belt Activity Tolerance: Patient tolerated treatment well Patient left: in chair;with call bell/phone within reach;with chair alarm set;with family/visitor present Nurse Communication: Mobility status PT Visit Diagnosis: Other abnormalities of gait and mobility (R26.89);Muscle weakness (generalized)  (M62.81);Pain Pain - Right/Left: Right Pain - part of body: Knee    Time: 0941-1020 PT Time Calculation (min) (ACUTE ONLY): 39 min   Charges:   PT Evaluation $PT Eval Low Complexity: 1 Low PT Treatments $Gait Training: 8-22 mins $Therapeutic Activity: 8-22 mins PT General Charges $$ ACUTE PT VISIT: 1 Visit         Tori Edithe Dobbin PT, DPT 09/02/23, 11:06 AM

## 2023-09-02 NOTE — TOC Transition Note (Signed)
 Transition of Care [facility]) - Discharge Note   Patient Details  Name: Kathryn Castillo MRN: [id number] Date of Birth: 10-20-1950  [facility]) CM/SW Contact:  Bari Leys, RN Phone Number: 09/02/2023, 10:58 AM   Clinical Narrative:   Met with pt at bedside to review dc therapy and home equipment needs, pt confirmed HH PT ([facility]), [employer] to deliver RW to bedside.  MOON completed. No TOC needs.     Final next level of care: Home w Home Health Services     Patient Goals and CMS Choice Patient states their goals for this hospitalization and ongoing recovery are:: return home          Discharge Placement                       Discharge Plan and Services Additional resources added to the After Visit Summary for                  DME Arranged: Walker rolling DME Agency: [employer]       HH Arranged: PT HH Agency: [facility] ([facility])        Social Drivers of Health (SDOH) Interventions SDOH Screenings   Food Insecurity: No Food Insecurity (09/01/2023)  Housing: Low Risk  (09/01/2023)  Transportation Needs: No Transportation Needs (09/01/2023)  Utilities: Not At Risk (09/01/2023)  Social Connections: Unknown (09/01/2023)  Tobacco Use: Medium Risk (09/01/2023)     Readmission Risk Interventions     No data to display

## 2023-09-03 DIAGNOSIS — M1711 Unilateral primary osteoarthritis, right knee: Secondary | ICD-10-CM | POA: Diagnosis not present

## 2023-09-03 LAB — CBC
HCT: 39.9 % (ref 36.0–46.0)
Hemoglobin: 13.1 g/dL (ref 12.0–15.0)
MCH: 28.2 pg (ref 26.0–34.0)
MCHC: 32.8 g/dL (ref 30.0–36.0)
MCV: 86 fL (ref 80.0–100.0)
Platelets: 231 10*3/uL (ref 150–400)
RBC: 4.64 MIL/uL (ref 3.87–5.11)
RDW: 13.7 % (ref 11.5–15.5)
WBC: 9.8 10*3/uL (ref 4.0–10.5)
nRBC: 0 % (ref 0.0–0.2)

## 2023-09-03 MED ORDER — ONDANSETRON HCL 4 MG PO TABS: 4.0000 mg | ORAL_TABLET | Freq: Three times a day (TID) | ORAL | 0 refills | Status: DC | PRN

## 2023-09-03 MED ORDER — SENNA-DOCUSATE SODIUM 8.6-50 MG PO TABS: 2.0000 | ORAL_TABLET | Freq: Every day | ORAL | 1 refills | Status: DC

## 2023-09-03 MED ORDER — BACLOFEN 10 MG PO TABS: 10.0000 mg | ORAL_TABLET | Freq: Three times a day (TID) | ORAL | 0 refills | Status: AC

## 2023-09-03 MED ORDER — ASPIRIN 325 MG PO TBEC: 325.0000 mg | DELAYED_RELEASE_TABLET | Freq: Two times a day (BID) | ORAL | 0 refills | Status: DC

## 2023-09-03 MED ORDER — OXYCODONE HCL 5 MG PO TABS: 5.0000 mg | ORAL_TABLET | ORAL | 0 refills | Status: DC | PRN

## 2023-09-03 NOTE — Progress Notes (Signed)
 Physical Therapy Treatment Patient Details Name: Kathryn Castillo MRN: [id number] DOB: 1950-11-18 Today's Date: 09/03/2023   History of Present Illness Kathryn Castillo is a 73 yo female s/p R TKA 5/13. 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 Pt unable to perform LAQ or SLR without assistance this date. Cued pt for exercises, provided assistance where needed, pt performing all in limited ROM. Heel slide in sitting at EOB to ~80 deg flexion, able to achieve full extension when resting in supine. Assisted pt into supine and placed ice on knee; notified RN of limitations and medication request. Pt has 4 steps to enter home with 1+1 in the home, family assisting pt at home unable to provide physical assistance. Will continue progressing mobility as able with plan to d/c home with family support and HHPT.   If plan is discharge home, recommend the following: A little help with walking and/or transfers;A little help with bathing/dressing/bathroom;Assistance with cooking/housework;Assist for transportation;Help with stairs or ramp for entrance   Can travel by private vehicle        Equipment Recommendations  None recommended by PT    Recommendations for Other Services       Precautions / Restrictions Precautions Precautions: Fall;Knee Recall of Precautions/Restrictions: Intact Restrictions Weight Bearing Restrictions Per Provider Order: No     Mobility  Bed Mobility Overal bed mobility: Needs Assistance         Sit to supine: Min assist   General bed mobility comments: pt seated on EOB upon arrival; min A to lift RLE back into bed    Transfers                        Ambulation/Gait                   Stairs             Wheelchair Mobility     Tilt Bed    Modified Rankin (Stroke Patients Only)       Balance Overall balance  assessment: Needs assistance Sitting-balance support: Feet supported Sitting balance-Leahy Scale: Good                                      Communication Communication Communication: No apparent difficulties  Cognition Arousal: Alert Behavior During Therapy: WFL for tasks assessed/performed   PT - Cognitive impairments: No apparent impairments                         Following commands: Intact      Cueing    Exercises Total Joint Exercises Ankle Circles/Pumps: AROM, Both, 10 reps, Supine Quad Sets: AROM, Right, Supine, 5 reps Heel Slides: AROM, Right, 10 reps, Seated Straight Leg Raises: AAROM, Right, 5 reps, Supine Long Arc Quad: AAROM, Right, 5 reps, Seated    General Comments        Pertinent Vitals/Pain Pain Assessment Pain Assessment: 0-10 Pain Score: 10-Worst pain ever Pain Location: R knee Pain Descriptors / Indicators: Grimacing, Guarding Pain Intervention(s): Limited activity within patient's tolerance, Monitored during session, Repositioned, Ice applied, Patient requesting pain meds-RN notified    Home Living  Prior Function            PT Goals (current goals can now be found in the care plan section) Acute Rehab PT Goals Patient Stated Goal: regain strength and independence PT Goal Formulation: With patient/family Time For Goal Achievement: 09/16/23 Potential to Achieve Goals: Good Progress towards PT goals:  (slowly, limited by pain)    Frequency    7X/week      PT Plan      Co-evaluation              AM-PAC PT "6 Clicks" Mobility   Outcome Measure  Help needed turning from your back to your side while in a flat bed without using bedrails?: A Little Help needed moving from lying on your back to sitting on the side of a flat bed without using bedrails?: A Little Help needed moving to and from a bed to a chair (including a wheelchair)?: A Little Help needed standing up  from a chair using your arms (e.g., wheelchair or bedside chair)?: A Little Help needed to walk in hospital room?: A Little Help needed climbing 3-5 steps with a railing? : Total 6 Click Score: 16    End of Session   Activity Tolerance: Patient limited by pain Patient left: in bed;with call bell/phone within reach;with bed alarm set;with family/visitor present Nurse Communication: Mobility status;Patient requests pain meds PT Visit Diagnosis: Other abnormalities of gait and mobility (R26.89);Muscle weakness (generalized) (M62.81);Pain Pain - Right/Left: Right Pain - part of body: Knee     Time: 1117-1140 PT Time Calculation (min) (ACUTE ONLY): 23 min  Charges:    $Therapeutic Exercise: 8-22 mins $Therapeutic Activity: 8-22 mins PT General Charges $$ ACUTE PT VISIT: 1 Visit                     Tori Casimir Barcellos PT, DPT 09/03/23, 12:17 PM

## 2023-09-03 NOTE — Progress Notes (Signed)
 Physical Therapy Treatment Patient Details Name: Kathryn Castillo MRN: [id number] DOB: 11-Feb-1951 Today's Date: 09/03/2023   History of Present Illness Kathryn Castillo is a 73 yo female s/p R TKA 5/13. 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 Pt amb 100 ft, supv with RW, short steps without LOB, no knee buckling bilaterally. Pt able to perform SLR without extensor lag this session and LAQ, limited to 3 reps each due to pain. Pt performs bed mobility with supv, able to assist R leg out of bed and into bed once therapist dons gait belt. Steps not attempted this afternoon due to ongoing intermittent nausea and pt reports pain finally under control. Plan to progress mobility as able with plan to d/c home with HHPT and family support.   If plan is discharge home, recommend the following: A little help with walking and/or transfers;A little help with bathing/dressing/bathroom;Assistance with cooking/housework;Assist for transportation;Help with stairs or ramp for entrance   Can travel by private vehicle        Equipment Recommendations  None recommended by PT    Recommendations for Other Services       Precautions / Restrictions Precautions Precautions: Fall;Knee Recall of Precautions/Restrictions: Intact Restrictions Weight Bearing Restrictions Per Provider Order: No     Mobility  Bed Mobility Overal bed mobility: Needs Assistance Bed Mobility: Supine to Sit, Sit to Supine     Supine to sit: Used rails, Supervision Sit to supine: Used rails, Supervision   General bed mobility comments: supv for supine<>sit, assist in placing gait belt around R foot and pt able to self assist RLE to EOB/into bed wtih increasead time and effort    Transfers Overall transfer level: Needs assistance Equipment used: Rolling walker (2 wheels) Transfers: Sit to/from Stand Sit to Stand:  Supervision           General transfer comment: supv to power to stand, RLE slightly forward for comfort, BUE assisting for controlled return to sitting    Ambulation/Gait Ambulation/Gait assistance: Supervision Gait Distance (Feet): 100 Feet Assistive device: Rolling walker (2 wheels) Gait Pattern/deviations: Step-through pattern, Decreased stride length, Decreased step length - right, Decreased step length - left Gait velocity: decreased     General Gait Details: short steps with slight step through gait pattern, no knee buckling noted bilaterally, decreased cadence, limited by pain and "dizzy from pain medication"   Stairs             Wheelchair Mobility     Tilt Bed    Modified Rankin (Stroke Patients Only)       Balance Overall balance assessment: Needs assistance Sitting-balance support: Feet supported Sitting balance-Leahy Scale: Good     Standing balance support: Reliant on assistive device for balance, During functional activity, Bilateral upper extremity supported Standing balance-Leahy Scale: Poor                              Communication Communication Communication: No apparent difficulties  Cognition Arousal: Alert Behavior During Therapy: WFL for tasks assessed/performed   PT - Cognitive impairments: No apparent impairments                         Following commands: Intact      Cueing    Exercises Total Joint Exercises Ankle Circles/Pumps: AROM, Both, 10  reps, Supine Quad Sets: AROM, Right, Supine, 5 reps Heel Slides: AROM, Right, 10 reps, Seated Straight Leg Raises: AROM, Right, Supine (3 reps) Long Arc Quad: AROM, Right, Seated (3 reps)    General Comments        Pertinent Vitals/Pain Pain Assessment Pain Assessment: 0-10 Pain Score: 6  Pain Location: R knee Pain Descriptors / Indicators: Grimacing, Guarding, Sore Pain Intervention(s): Limited activity within patient's tolerance, Monitored during  session, Premedicated before session, Repositioned, Ice applied    Home Living                          Prior Function            PT Goals (current goals can now be found in the care plan section) Acute Rehab PT Goals Patient Stated Goal: regain strength and independence PT Goal Formulation: With patient/family Time For Goal Achievement: 09/16/23 Potential to Achieve Goals: Good Progress towards PT goals: Progressing toward goals    Frequency    7X/week      PT Plan      Co-evaluation              AM-PAC PT "6 Clicks" Mobility   Outcome Measure  Help needed turning from your back to your side while in a flat bed without using bedrails?: A Little Help needed moving from lying on your back to sitting on the side of a flat bed without using bedrails?: A Little Help needed moving to and from a bed to a chair (including a wheelchair)?: A Little Help needed standing up from a chair using your arms (e.g., wheelchair or bedside chair)?: A Little Help needed to walk in hospital room?: A Little Help needed climbing 3-5 steps with a railing? : Total 6 Click Score: 16    End of Session Equipment Utilized During Treatment: Gait belt Activity Tolerance: Patient limited by pain;Patient tolerated treatment well Patient left: in bed;with call bell/phone within reach;with bed alarm set;with family/visitor present Nurse Communication: Mobility status PT Visit Diagnosis: Other abnormalities of gait and mobility (R26.89);Muscle weakness (generalized) (M62.81);Pain Pain - Right/Left: Right Pain - part of body: Knee     Time: 1350-1416 PT Time Calculation (min) (ACUTE ONLY): 26 min  Charges:    $Gait Training: 8-22 mins $Therapeutic Activity: 8-22 mins PT General Charges $$ ACUTE PT VISIT: 1 Visit                     Tori Kadan Millstein PT, DPT 09/03/23, 2:28 PM

## 2023-09-03 NOTE — Plan of Care (Signed)
  Problem: Activity: Goal: Risk for activity intolerance will decrease Outcome: Progressing   Problem: Pain Managment: Goal: General experience of comfort will improve and/or be controlled Outcome: Progressing

## 2023-09-03 NOTE — Progress Notes (Signed)
 PT Cancellation Note  Patient Details Name: Yuvette Postal MRN: [id number] DOB: 01-29-1951   Cancelled Treatment:    Reason Eval/Treat Not Completed: Other (comment) (nasuea). Attempted AM session at 9:32 but pt reports strong nausea, recently took medication and requesting therapist check back later. Will continue to follow for PT session.   Tori Tramar Brueckner PT, DPT 09/03/23, 10:00 AM

## 2023-09-03 NOTE — Progress Notes (Signed)
     Subjective: 2 Days Post-Op s/p Procedure(s): ARTHROPLASTY, KNEE, TOTAL   Patient is alert, oriented. Upset because she did not get much sleep last night due to pain. Pain much worse today. Oxycodone  and muscle relaxer helping.  Denies chest pain, SOB, Calf pain. No vomiting. No other complaints.     Objective:  PE: VITALS:   Vitals:   09/02/23 1338 09/02/23 1752 09/02/23 2142 09/03/23 0622  BP: (!) 121/48 (!) 136/52 (!) 156/62 (!) 150/56  Pulse: 60 65 64 71  Resp: 16 16 17 16   Temp: 97.9 F (36.6 C) 98.6 F (37 C) 99.7 F (37.6 C) 98.5 F (36.9 C)  TempSrc:   Oral Oral  SpO2: 96% 98% 100% 96%  Weight:      Height:        Sensation intact distally Intact pulses distally Dorsiflexion/Plantar flexion intact Incision: dressing C/D/I  LABS  Results for orders placed or performed during the hospital encounter of 09/01/23 (from the past 24 hours)  CBC     Status: None   Collection Time: 09/03/23  3:44 AM  Result Value Ref Range   WBC 9.8 4.0 - 10.5 K/uL   RBC 4.64 3.87 - 5.11 MIL/uL   Hemoglobin 13.1 12.0 - 15.0 g/dL   HCT 16.1 09.6 - 04.5 %   MCV 86.0 80.0 - 100.0 fL   MCH 28.2 26.0 - 34.0 pg   MCHC 32.8 30.0 - 36.0 g/dL   RDW 40.9 81.1 - 91.4 %   Platelets 231 150 - 400 K/uL   nRBC 0.0 0.0 - 0.2 %    DG Knee Right Port Result Date: 09/01/2023 CLINICAL DATA:  Status post right knee replacement. EXAM: PORTABLE RIGHT KNEE - 1-2 VIEW COMPARISON:  None Available. FINDINGS: Right knee arthroplasty in expected alignment. No periprosthetic lucency or fracture. There has been patellar resurfacing. Recent postsurgical change includes air and edema in the soft tissues and joint space. IMPRESSION: Right knee arthroplasty without immediate postoperative complication. Electronically Signed   By: Chadwick Colonel M.D.   On: 09/01/2023 17:45    Assessment/Plan: Principal Problem:   S/P TKR (total knee replacement), right    2 Days Post-Op s/p  Procedure(s): ARTHROPLASTY, KNEE, TOTAL  Weightbearing: WBAT RLE Insicional and dressing care: Reinforce dressings as needed VTE prophylaxis: Aspirin  325mg  BID x 30 days Pain control: continue current regimen Follow - up plan: 2 weeks w/ Dr. Agatha Horsfall Dispo: patient did well yesterday, likely to need another 2 sessions of PT today due to increased pain. Hopeful to discharge home this pm after stair training   Contact information:   Hurshel Maidens, PA-C Weekdays 8-5  After hours and holidays please check [email or web address] for group call information for [facility]  Abraham Hoffmann 09/03/2023, 9:44 AM

## 2023-09-03 NOTE — Plan of Care (Signed)
  Problem: Elimination: Goal: Will not experience complications related to urinary retention Outcome: Progressing   Problem: Pain Managment: Goal: General experience of comfort will improve and/or be controlled Outcome: Progressing   Problem: Safety: Goal: Ability to remain free from injury will improve Outcome: Progressing   Problem: Education: Goal: Knowledge of the prescribed therapeutic regimen will improve Outcome: Progressing   Problem: Activity: Goal: Range of joint motion will improve Outcome: Progressing

## 2023-09-04 DIAGNOSIS — M1711 Unilateral primary osteoarthritis, right knee: Secondary | ICD-10-CM | POA: Diagnosis not present

## 2023-09-04 DIAGNOSIS — Z96651 Presence of right artificial knee joint: Secondary | ICD-10-CM | POA: Diagnosis not present

## 2023-09-04 MED ORDER — ASPIRIN 325 MG PO TBEC: 325.0000 mg | DELAYED_RELEASE_TABLET | Freq: Two times a day (BID) | ORAL | Status: DC

## 2023-09-04 NOTE — Plan of Care (Signed)
   Problem: Safety: Goal: Ability to remain free from injury will improve Outcome: Progressing   Problem: Activity: Goal: Range of joint motion will improve Outcome: Progressing   Problem: Pain Management: Goal: Pain level will decrease with appropriate interventions Outcome: Progressing

## 2023-09-04 NOTE — Progress Notes (Signed)
 Physical Therapy Treatment Patient Details Name: Kathryn Castillo MRN: [id number] DOB: 06-20-1950 Today's Date: 09/04/2023   History of Present Illness Kathryn Castillo is a 73 yo female s/p R TKA 5/13. 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 Pt completes stair training with CGA-supv for ascending 4 steps forward and descending 4 steps backwards, using single R handrail, good sequencing, no knee buckling or unsteadiness noted. Completed single curb step with RW as pt has 1+1 in the home; successfully ascended forward with RW and descended posterior with RW. All stair training education completed; pt and family/friend feel confident in assisting pt at home. Pt has HEP. All questions answered, pt reports ready to d/c home with family support, HHPT initially and transitioning to OPPT.   If plan is discharge home, recommend the following: A little help with walking and/or transfers;A little help with bathing/dressing/bathroom;Assistance with cooking/housework;Assist for transportation;Help with stairs or ramp for entrance   Can travel by private vehicle        Equipment Recommendations  None recommended by PT    Recommendations for Other Services       Precautions / Restrictions Precautions Precautions: Fall;Knee Recall of Precautions/Restrictions: Intact Restrictions Weight Bearing Restrictions Per Provider Order: No     Mobility  Bed Mobility Overal bed mobility: Needs Assistance Bed Mobility: Supine to Sit, Sit to Supine     Supine to sit: Supervision, Used rails Sit to supine: Min assist   General bed mobility comments: supv to come to sitting EOB, min A to lift RLE Back into bed    Transfers Overall transfer level: Needs assistance Equipment used: Rolling walker (2 wheels) Transfers: Sit to/from Stand Sit to Stand:  Supervision           General transfer comment: supv with transfers    Ambulation/Gait Ambulation/Gait assistance: Supervision Gait Distance (Feet): 50 Feet Assistive device: Rolling walker (2 wheels) Gait Pattern/deviations: Step-through pattern, Decreased stride length, Decreased step length - right, Decreased step length - left Gait velocity: decreased     General Gait Details: short steps with sligh step through gait pattern, maintains RW at appropriate distance, no kne buckling bilaterally, distance limited with focus on stairs   Stairs Stairs: Yes Stairs assistance: Contact guard assist, Supervision Stair Management: One rail Right, With walker Number of Stairs: 5 General stair comments: pt ascends 4 steps with single R handrail (ascending) with proper sequencing, no LE Buckling noted, acending forward and descending backward, completed 2 steps wtih therapist and 2 with friend Andres Kea who will be taking her home; also completed single curb/step that pt will have to navigate in the home with RW, demo good sequencing with stepping up forward and stepping down backwards; CGA to supv with stair training   Wheelchair Mobility     Tilt Bed    Modified Rankin (Stroke Patients Only)       Balance Overall balance assessment: Needs assistance Sitting-balance support: Feet supported Sitting balance-Leahy Scale: Good     Standing balance support: Reliant on assistive device for balance, During functional activity, Bilateral upper extremity supported Standing balance-Leahy Scale: Poor                              Communication Communication Communication: No apparent difficulties  Cognition Arousal: Alert Behavior During Therapy: [facility] for  tasks assessed/performed   PT - Cognitive impairments: No apparent impairments                         Following commands: Intact      Cueing    Exercises      General Comments        Pertinent  Vitals/Pain Pain Assessment Pain Assessment: Faces Faces Pain Scale: Hurts a little bit Pain Location: R knee Pain Descriptors / Indicators: Grimacing, Guarding, Sore Pain Intervention(s): Limited activity within patient's tolerance, Monitored during session, Premedicated before session    Home Living                          Prior Function            PT Goals (current goals can now be found in the care plan section) Acute Rehab PT Goals Patient Stated Goal: regain strength and independence PT Goal Formulation: With patient/family Time For Goal Achievement: 09/16/23 Potential to Achieve Goals: Good Progress towards PT goals: Progressing toward goals    Frequency    7X/week      PT Plan      Co-evaluation              AM-PAC PT "6 Clicks" Mobility   Outcome Measure  Help needed turning from your back to your side while in a flat bed without using bedrails?: A Little Help needed moving from lying on your back to sitting on the side of a flat bed without using bedrails?: A Little Help needed moving to and from a bed to a chair (including a wheelchair)?: A Little Help needed standing up from a chair using your arms (e.g., wheelchair or bedside chair)?: A Little Help needed to walk in hospital room?: A Little Help needed climbing 3-5 steps with a railing? : A Little 6 Click Score: 18    End of Session Equipment Utilized During Treatment: Gait belt Activity Tolerance: Patient tolerated treatment well Patient left: in bed;with call bell/phone within reach;with family/visitor present Nurse Communication: Mobility status PT Visit Diagnosis: Other abnormalities of gait and mobility (R26.89);Muscle weakness (generalized) (M62.81);Pain Pain - Right/Left: Right Pain - part of body: Knee     Time: 4696-2952 PT Time Calculation (min) (ACUTE ONLY): 24 min  Charges:    $Gait Training: 23-37 mins PT General Charges $$ ACUTE PT VISIT: 1 Visit                      Tori Cavion Faiola PT, DPT 09/04/23, 11:21 AM

## 2023-09-04 NOTE — Discharge Summary (Signed)
 Discharge Summary  Patient ID: Kathryn Castillo MRN: [id number] DOB/AGE: February 02, 1951 73 y.o.  Admit date: 09/01/2023 Discharge date: 09/04/2023  Admission Diagnoses:  S/P TKR (total knee replacement), right  Discharge Diagnoses:  Principal Problem:   S/P TKR (total knee replacement), right   Past Medical History:  Diagnosis Date   Arthritis    Carotid stenosis    Coronary artery disease    s/p remote CABG x1 with LIMA to LAD placed in 1996.   LHC in 2010 showed an occluded LAD to the ostium and patent LIMA to the LAD.  Repeat left heart cath in 08/23/15 in setting of angina and acute diastolic CHF showed single vessel obstructive CAD with ostial LAD occlusion and  LIMA to LAD was widely patent.   Hyperlipemia    Hypertension    Hypothyroidism    S/P bunionectomy    Sleep apnea    uses a cpap   Trigger thumb of left hand 09/17/2012    Surgeries: Procedure(s): ARTHROPLASTY, KNEE, TOTAL on 09/01/2023   Consultants (if any):   Discharged Condition: Improved  Hospital Course: Kathryn Castillo is an 73 y.o. female who was admitted 09/01/2023 with a diagnosis of S/P TKR (total knee replacement), right and went to the operating room on 09/01/2023 and underwent the above named procedures.    She was given perioperative antibiotics:  Anti-infectives (From admission, onward)    Start     Dose/Rate Route Frequency Ordered Stop   09/01/23 2000  ceFAZolin (ANCEF) IVPB 2g/100 mL premix        2 g 200 mL/hr over 30 Minutes Intravenous Every 6 hours 09/01/23 1854 09/02/23 0236   09/01/23 1130  ceFAZolin (ANCEF) IVPB 2g/100 mL premix        2 g 200 mL/hr over 30 Minutes Intravenous On call to O.R. 09/01/23 1125 09/01/23 1353     .  She was given sequential compression devices, early ambulation, and aspirin  for DVT prophylaxis.  She benefited maximally from the hospital stay and there were no complications.    Recent vital signs:  Vitals:   09/03/23 2122 09/04/23 0540  BP:  130/65 114/65  Pulse: 82 81  Resp: 17 17  Temp: 98.4 F (36.9 C) 99.3 F (37.4 C)  SpO2: 97% 96%    Recent laboratory studies:  Lab Results  Component Value Date   HGB 13.1 09/03/2023   HGB 13.0 09/02/2023   HGB 14.9 08/19/2023   Lab Results  Component Value Date   WBC 9.8 09/03/2023   PLT 231 09/03/2023   Lab Results  Component Value Date   INR 1.08 08/23/2015   Lab Results  Component Value Date   NA 137 09/02/2023   K 4.4 09/02/2023   CL 105 09/02/2023   CO2 24 09/02/2023   BUN 15 09/02/2023   CREATININE 0.72 09/02/2023   GLUCOSE 129 (H) 09/02/2023    Discharge Medications:   Allergies as of 09/04/2023       Reactions   Bactrim [sulfamethoxazole-trimethoprim] Other (See Comments)   REACTION: "DON'T REMEMBER"        Medication List     TAKE these medications    aspirin  EC 325 MG tablet Take 1 tablet (325 mg total) by mouth 2 (two) times daily. What changed:  medication strength how much to take when to take this   atenolol  100 MG tablet Commonly known as: TENORMIN  Take 1 tablet (100 mg total) by mouth every evening.   baclofen 10 MG tablet Commonly  known as: LIORESAL Take 1 tablet (10 mg total) by mouth 3 (three) times daily. As needed for muscle spasm   cholecalciferol  1000 units tablet Commonly known as: VITAMIN D  Take 2,000 Units by mouth daily.   dapagliflozin  propanediol 10 MG Tabs tablet Commonly known as: Farxiga  Take 1 tablet (10 mg total) by mouth daily before breakfast.   levothyroxine 100 MCG tablet Commonly known as: SYNTHROID Take 100 mcg by mouth daily before breakfast.   metFORMIN 500 MG tablet Commonly known as: GLUCOPHAGE Take 500 mg by mouth at bedtime.   multivitamin with minerals tablet Take 1 tablet by mouth at bedtime.   Omega-3 1000 MG Caps Take 2,000 mg by mouth daily.   ondansetron  4 MG tablet Commonly known as: Zofran  Take 1 tablet (4 mg total) by mouth every 8 (eight) hours as needed for nausea or  vomiting.   oxyCODONE  5 MG immediate release tablet Commonly known as: Roxicodone  Take 1 tablet (5 mg total) by mouth every 4 (four) hours as needed for severe pain (pain score 7-10).   Ozempic (1 MG/DOSE) 4 MG/3ML Sopn Generic drug: Semaglutide (1 MG/DOSE) Inject 1 mg into the skin once a week.   rosuvastatin  40 MG tablet Commonly known as: CRESTOR  Take 40 mg by mouth at bedtime.   sennosides-docusate sodium  8.6-50 MG tablet Commonly known as: SENOKOT-S Take 2 tablets by mouth daily.        Diagnostic Studies: DG Knee Right Port Result Date: 09/01/2023 CLINICAL DATA:  Status post right knee replacement. EXAM: PORTABLE RIGHT KNEE - 1-2 VIEW COMPARISON:  None Available. FINDINGS: Right knee arthroplasty in expected alignment. No periprosthetic lucency or fracture. There has been patellar resurfacing. Recent postsurgical change includes air and edema in the soft tissues and joint space. IMPRESSION: Right knee arthroplasty without immediate postoperative complication. Electronically Signed   By: Chadwick Colonel M.D.   On: 09/01/2023 17:45    Disposition: Discharge disposition: 01-Home or Self Care          Follow-up Information     Osa Blase, MD. Go on 09/16/2023.   Specialty: Orthopedic Surgery Why: Your appointment is scheduled for 9:45 Contact information: [address] [place] [address] [phone]         [facility] Follow up.   Why: HHPT will provide 6 home visits prior to starting outpatient physical therapy        [facility] Go on 09/16/2023.   Why: Your outpatient physical therapy has been scheduled. they will call you with a time Contact information: [facility] [address] [place] [address] [phone]                  Signed: Johny Nap 09/04/2023, 9:29 AM

## 2023-09-04 NOTE — Progress Notes (Signed)
   09/04/23 0305  BiPAP/CPAP/SIPAP  BiPAP/CPAP/SIPAP Pt Type Adult  BiPAP/CPAP/SIPAP Resmed  Flow Rate 2 lpm  Patient Home Machine No  Patient Home Mask Yes  Patient Home Tubing No  Auto Titrate Yes  Minimum cmH2O 5 cmH2O  Maximum cmH2O 20 cmH2O  Device Plugged into RED Power Outlet Yes

## 2023-09-05 DIAGNOSIS — E039 Hypothyroidism, unspecified: Secondary | ICD-10-CM | POA: Diagnosis not present

## 2023-09-05 DIAGNOSIS — Z471 Aftercare following joint replacement surgery: Secondary | ICD-10-CM | POA: Diagnosis not present

## 2023-09-05 DIAGNOSIS — Z7985 Long-term (current) use of injectable non-insulin antidiabetic drugs: Secondary | ICD-10-CM | POA: Diagnosis not present

## 2023-09-05 DIAGNOSIS — I10 Essential (primary) hypertension: Secondary | ICD-10-CM | POA: Diagnosis not present

## 2023-09-05 DIAGNOSIS — E119 Type 2 diabetes mellitus without complications: Secondary | ICD-10-CM | POA: Diagnosis not present

## 2023-09-05 DIAGNOSIS — Z7984 Long term (current) use of oral hypoglycemic drugs: Secondary | ICD-10-CM | POA: Diagnosis not present

## 2023-09-05 DIAGNOSIS — Z96651 Presence of right artificial knee joint: Secondary | ICD-10-CM | POA: Diagnosis not present

## 2023-09-05 DIAGNOSIS — I2510 Atherosclerotic heart disease of native coronary artery without angina pectoris: Secondary | ICD-10-CM | POA: Diagnosis not present

## 2023-09-05 DIAGNOSIS — Z6839 Body mass index (BMI) 39.0-39.9, adult: Secondary | ICD-10-CM | POA: Diagnosis not present

## 2023-09-05 DIAGNOSIS — Z87891 Personal history of nicotine dependence: Secondary | ICD-10-CM | POA: Diagnosis not present

## 2023-09-05 DIAGNOSIS — E785 Hyperlipidemia, unspecified: Secondary | ICD-10-CM | POA: Diagnosis not present

## 2023-09-15 DIAGNOSIS — E039 Hypothyroidism, unspecified: Secondary | ICD-10-CM | POA: Diagnosis not present

## 2023-09-15 DIAGNOSIS — E7800 Pure hypercholesterolemia, unspecified: Secondary | ICD-10-CM | POA: Diagnosis not present

## 2023-09-15 DIAGNOSIS — I10 Essential (primary) hypertension: Secondary | ICD-10-CM | POA: Diagnosis not present

## 2023-09-15 DIAGNOSIS — Z96659 Presence of unspecified artificial knee joint: Secondary | ICD-10-CM | POA: Diagnosis not present

## 2023-09-15 DIAGNOSIS — E1169 Type 2 diabetes mellitus with other specified complication: Secondary | ICD-10-CM | POA: Diagnosis not present

## 2023-09-15 DIAGNOSIS — I2510 Atherosclerotic heart disease of native coronary artery without angina pectoris: Secondary | ICD-10-CM | POA: Diagnosis not present

## 2023-09-16 DIAGNOSIS — M25661 Stiffness of right knee, not elsewhere classified: Secondary | ICD-10-CM | POA: Diagnosis not present

## 2023-09-16 DIAGNOSIS — R262 Difficulty in walking, not elsewhere classified: Secondary | ICD-10-CM | POA: Diagnosis not present

## 2023-09-16 DIAGNOSIS — M1711 Unilateral primary osteoarthritis, right knee: Secondary | ICD-10-CM | POA: Diagnosis not present

## 2023-09-16 DIAGNOSIS — M6281 Muscle weakness (generalized): Secondary | ICD-10-CM | POA: Diagnosis not present

## 2023-09-19 DIAGNOSIS — I2510 Atherosclerotic heart disease of native coronary artery without angina pectoris: Secondary | ICD-10-CM | POA: Diagnosis not present

## 2023-09-19 DIAGNOSIS — E7800 Pure hypercholesterolemia, unspecified: Secondary | ICD-10-CM | POA: Diagnosis not present

## 2023-09-19 DIAGNOSIS — I10 Essential (primary) hypertension: Secondary | ICD-10-CM | POA: Diagnosis not present

## 2023-09-22 ENCOUNTER — Emergency Department (HOSPITAL_COMMUNITY)

## 2023-09-22 ENCOUNTER — Emergency Department (HOSPITAL_COMMUNITY)
Admission: EM | Admit: 2023-09-22 | Discharge: 2023-09-23 | Disposition: A | Discharge status: Home / Self Care | Attending: Emergency Medicine | Admitting: Emergency Medicine

## 2023-09-22 DIAGNOSIS — R0602 Shortness of breath: Secondary | ICD-10-CM | POA: Diagnosis not present

## 2023-09-22 DIAGNOSIS — R42 Dizziness and giddiness: Secondary | ICD-10-CM | POA: Diagnosis not present

## 2023-09-22 DIAGNOSIS — R2241 Localized swelling, mass and lump, right lower limb: Secondary | ICD-10-CM | POA: Insufficient documentation

## 2023-09-22 DIAGNOSIS — R911 Solitary pulmonary nodule: Secondary | ICD-10-CM | POA: Diagnosis not present

## 2023-09-22 DIAGNOSIS — M6281 Muscle weakness (generalized): Secondary | ICD-10-CM | POA: Diagnosis not present

## 2023-09-22 DIAGNOSIS — R262 Difficulty in walking, not elsewhere classified: Secondary | ICD-10-CM | POA: Diagnosis not present

## 2023-09-22 DIAGNOSIS — I700 Atherosclerosis of aorta: Secondary | ICD-10-CM | POA: Insufficient documentation

## 2023-09-22 DIAGNOSIS — I10 Essential (primary) hypertension: Secondary | ICD-10-CM | POA: Diagnosis not present

## 2023-09-22 DIAGNOSIS — Z79899 Other long term (current) drug therapy: Secondary | ICD-10-CM | POA: Insufficient documentation

## 2023-09-22 DIAGNOSIS — E876 Hypokalemia: Secondary | ICD-10-CM | POA: Diagnosis not present

## 2023-09-22 DIAGNOSIS — I499 Cardiac arrhythmia, unspecified: Secondary | ICD-10-CM | POA: Diagnosis not present

## 2023-09-22 DIAGNOSIS — Z96651 Presence of right artificial knee joint: Secondary | ICD-10-CM | POA: Diagnosis not present

## 2023-09-22 DIAGNOSIS — M25512 Pain in left shoulder: Secondary | ICD-10-CM | POA: Diagnosis not present

## 2023-09-22 DIAGNOSIS — Z7901 Long term (current) use of anticoagulants: Secondary | ICD-10-CM | POA: Diagnosis not present

## 2023-09-22 DIAGNOSIS — I110 Hypertensive heart disease with heart failure: Secondary | ICD-10-CM | POA: Diagnosis not present

## 2023-09-22 DIAGNOSIS — I2510 Atherosclerotic heart disease of native coronary artery without angina pectoris: Secondary | ICD-10-CM | POA: Insufficient documentation

## 2023-09-22 DIAGNOSIS — R000 Tachycardia, unspecified: Secondary | ICD-10-CM | POA: Diagnosis not present

## 2023-09-22 DIAGNOSIS — M25661 Stiffness of right knee, not elsewhere classified: Secondary | ICD-10-CM | POA: Diagnosis not present

## 2023-09-22 DIAGNOSIS — I480 Paroxysmal atrial fibrillation: Secondary | ICD-10-CM | POA: Insufficient documentation

## 2023-09-22 DIAGNOSIS — M1711 Unilateral primary osteoarthritis, right knee: Secondary | ICD-10-CM | POA: Diagnosis not present

## 2023-09-22 DIAGNOSIS — I509 Heart failure, unspecified: Secondary | ICD-10-CM | POA: Insufficient documentation

## 2023-09-22 DIAGNOSIS — R002 Palpitations: Secondary | ICD-10-CM | POA: Diagnosis present

## 2023-09-22 DIAGNOSIS — R935 Abnormal findings on diagnostic imaging of other abdominal regions, including retroperitoneum: Secondary | ICD-10-CM | POA: Diagnosis not present

## 2023-09-22 LAB — BASIC METABOLIC PANEL WITH GFR
Anion gap: 16 — ABNORMAL HIGH (ref 5–15)
BUN: 12 mg/dL (ref 8–23)
CO2: 23 mmol/L (ref 22–32)
Calcium: 10.3 mg/dL (ref 8.9–10.3)
Chloride: 102 mmol/L (ref 98–111)
Creatinine, Ser: 0.68 mg/dL (ref 0.44–1.00)
GFR, Estimated: >60 mL/min (ref >60)
Glucose, Bld: 131 mg/dL — ABNORMAL HIGH (ref 70–99)
Potassium: 3.4 mmol/L — ABNORMAL LOW (ref 3.5–5.1)
Sodium: 141 mmol/L (ref 135–145)

## 2023-09-22 LAB — BRAIN NATRIURETIC PEPTIDE: B Natriuretic Peptide: 129.1 pg/mL — ABNORMAL HIGH (ref 0.0–100.0)

## 2023-09-22 LAB — CBC
HCT: 45.4 % (ref 36.0–46.0)
Hemoglobin: 15.4 g/dL — ABNORMAL HIGH (ref 12.0–15.0)
MCH: 28.3 pg (ref 26.0–34.0)
MCHC: 33.9 g/dL (ref 30.0–36.0)
MCV: 83.3 fL (ref 80.0–100.0)
Platelets: 391 10*3/uL (ref 150–400)
RBC: 5.45 MIL/uL — ABNORMAL HIGH (ref 3.87–5.11)
RDW: 14 % (ref 11.5–15.5)
WBC: 7.3 10*3/uL (ref 4.0–10.5)
nRBC: 0 % (ref 0.0–0.2)

## 2023-09-22 LAB — MAGNESIUM: Magnesium: 2.1 mg/dL (ref 1.7–2.4)

## 2023-09-22 LAB — D-DIMER, QUANTITATIVE: D-Dimer, Quant: 1.38 ug{FEU}/mL — ABNORMAL HIGH (ref 0.00–0.50)

## 2023-09-22 LAB — TSH: TSH: 3.293 u[IU]/mL (ref 0.350–4.500)

## 2023-09-22 LAB — TROPONIN I (HIGH SENSITIVITY): Troponin I (High Sensitivity): 5 ng/L (ref <18)

## 2023-09-22 MED ORDER — APIXABAN 5 MG PO TABS
5.0000 mg | ORAL_TABLET | ORAL | Status: AC
  Administered 2023-09-23: 5 mg via ORAL
  Filled 2023-09-22: qty 1

## 2023-09-22 MED ORDER — APIXABAN 5 MG PO TABS: 5.0000 mg | ORAL_TABLET | Freq: Two times a day (BID) | ORAL | 2 refills | Status: DC

## 2023-09-22 MED ORDER — IOHEXOL 350 MG/ML SOLN
75.0000 mL | Freq: Once | INTRAVENOUS | Status: AC | PRN
Start: 2023-09-22 — End: 2023-09-22
  Administered 2023-09-22: 75 mL via INTRAVENOUS

## 2023-09-22 NOTE — ED Notes (Signed)
 Patient transported to CT

## 2023-09-22 NOTE — ED Triage Notes (Signed)
 Pt bibgcems from ome c/o having dizzy spells and sob. Ems placed on monitor and hr 130-160. Ems gave 10 mg Caredizem hr went to 80-90 and other symptoms subsided.   Bp 136/82 97% ra  Cbg 134

## 2023-09-22 NOTE — ED Notes (Signed)
 Urine sample provided upon arrival, at bedside.

## 2023-09-22 NOTE — ED Provider Notes (Signed)
 [place] [facility] Provider Note   CSN: [id number] Arrival date & time: 09/22/23  2014     History {Add pertinent medical, surgical, social history, OB history to HPI:1} Chief Complaint  Patient presents with   Irregular Heart Beat    Kathryn Castillo is a 73 y.o. female.  HPI     Home Medications Prior to Admission medications   Medication Sig Start Date End Date Taking? Authorizing Provider  aspirin  EC 325 MG tablet Take 1 tablet (325 mg total) by mouth 2 (two) times daily. 09/03/23   Brown, Blaine K, PA-C  atenolol  (TENORMIN ) 100 MG tablet Take 1 tablet (100 mg total) by mouth every evening. 08/24/15   Ruddy Corral M, PA-C  baclofen  (LIORESAL ) 10 MG tablet Take 1 tablet (10 mg total) by mouth 3 (three) times daily. As needed for muscle spasm 09/03/23   Brown, Blaine K, PA-C  cholecalciferol  (VITAMIN D ) 1000 UNITS tablet Take 2,000 Units by mouth daily.     [provider]  dapagliflozin  propanediol (FARXIGA ) 10 MG TABS tablet Take 1 tablet (10 mg total) by mouth daily before breakfast. 12/13/21   Arty Binning, MD  levothyroxine  (SYNTHROID , LEVOTHROID) 100 MCG tablet Take 100 mcg by mouth daily before breakfast.    [provider]  metFORMIN  (GLUCOPHAGE ) 500 MG tablet Take 500 mg by mouth at bedtime.    [provider]  Multiple Vitamins-Minerals (MULTIVITAMIN WITH MINERALS) tablet Take 1 tablet by mouth at bedtime.     [provider]  Omega-3 1000 MG CAPS Take 2,000 mg by mouth daily.    [provider]  ondansetron  (ZOFRAN ) 4 MG tablet Take 1 tablet (4 mg total) by mouth every 8 (eight) hours as needed for nausea or vomiting. 09/03/23   Abraham Hoffmann, PA-C  oxyCODONE  (ROXICODONE ) 5 MG immediate release tablet Take 1 tablet (5 mg total) by mouth every 4 (four) hours as needed for severe pain (pain score 7-10). 09/03/23   Brown, Blaine K, PA-C  OZEMPIC, 1 MG/DOSE, 4 MG/3ML SOPN Inject 1 mg  into the skin once a week. Patient not taking: Reported on 07/23/2023 05/25/23   [provider]  rosuvastatin  (CRESTOR ) 40 MG tablet Take 40 mg by mouth at bedtime.    [provider]  sennosides-docusate sodium  (SENOKOT-S) 8.6-50 MG tablet Take 2 tablets by mouth daily. 09/03/23   Brown, Blaine K, PA-C      Allergies    Bactrim [sulfamethoxazole-trimethoprim]    Review of Systems   Review of Systems  Physical Exam Updated Vital Signs BP 133/86 (BP Location: Right Arm)   Pulse 80   Temp 98.3 F (36.8 C) (Oral)   Resp 19   SpO2 95%  Physical Exam  ED Results / Procedures / Treatments   Labs (all labs ordered are listed, but only abnormal results are displayed) Labs Reviewed - No data to display  EKG EKG Interpretation Date/Time:  Tuesday September 22 2023 20:21:30 EDT Ventricular Rate:  109 PR Interval:    QRS Duration:  117 QT Interval:  374 QTC Calculation: 463 R Axis:   16  Text Interpretation: Atrial fibrillation Incomplete right bundle branch block Probable left ventricular hypertrophy Confirmed by Shyrl Doyne [phone]) on 09/22/2023 8:22:33 PM  Radiology No results found.  Procedures Procedures  {Document cardiac monitor, telemetry assessment procedure when appropriate:1}  Medications Ordered in ED Medications - No data to display  ED Course/ Medical Decision Making/ A&P   {  Click here for ABCD2, HEART and other calculatorsREFRESH Note before signing :1}                              Medical Decision Making  ***  {Document critical care time when appropriate:1} {Document review of labs and clinical decision tools ie heart score, Chads2Vasc2 etc:1}  {Document your independent review of radiology images, and any outside records:1} {Document your discussion with family members, caretakers, and with consultants:1} {Document social determinants of health affecting pt's care:1} {Document your decision making why or why not admission, treatments  were needed:1} Final Clinical Impression(s) / ED Diagnoses Final diagnoses:  None    Rx / DC Orders ED Discharge Orders     None

## 2023-09-23 ENCOUNTER — Ambulatory Visit (HOSPITAL_COMMUNITY)
Admission: RE | Admit: 2023-09-23 | Discharge: 2023-09-23 | Disposition: A | Discharge status: Home / Self Care | Source: Ambulatory Visit | Attending: Emergency Medicine | Admitting: Emergency Medicine

## 2023-09-23 DIAGNOSIS — Z96651 Presence of right artificial knee joint: Secondary | ICD-10-CM | POA: Insufficient documentation

## 2023-09-23 DIAGNOSIS — Z471 Aftercare following joint replacement surgery: Secondary | ICD-10-CM | POA: Insufficient documentation

## 2023-09-23 DIAGNOSIS — M79604 Pain in right leg: Secondary | ICD-10-CM

## 2023-09-23 MED ORDER — DILTIAZEM HCL 30 MG PO TABS: 30.0000 mg | ORAL_TABLET | Freq: Every day | ORAL | 0 refills | Status: AC | PRN

## 2023-09-23 MED ORDER — DILTIAZEM HCL 30 MG PO TABS: 30.0000 mg | ORAL_TABLET | Freq: Every day | ORAL | 0 refills | Status: DC | PRN

## 2023-09-23 NOTE — Discharge Instructions (Addendum)
 You were seen for your atrial fibrillation in the emergency department.   At home, please start taking the Eliquis you were prescribed.  Continue your atenolol .  Take the diltiazem if you start to have palpitations.  Check your MyChart online for the results of any tests that had not resulted by the time you left the emergency department.   Follow-up with your primary doctor in 2-3 days regarding your visit.  Follow-up with A-fib clinic.  Return immediately to the emergency department if you experience any of the following: You have severe palpitations, palpitations that persist despite the medication, bleeding, or any other concerning symptoms.    Thank you for visiting our Emergency Department. It was a pleasure taking care of you today.   Please do not forget to return for your leg ultrasound tomorrow morning around 9 AM.  Information on my medicine - ELIQUIS (apixaban)  This medication education was reviewed with me or my healthcare representative as part of my discharge preparation.   Why was Eliquis prescribed for you? Eliquis was prescribed for you to reduce the risk of a blood clot forming that can cause a stroke if you have a medical condition called atrial fibrillation (a type of irregular heartbeat).  What do You need to know about Eliquis ? Take your Eliquis TWICE DAILY - one tablet in the morning and one tablet in the evening with or without food. If you have difficulty swallowing the tablet whole please discuss with your pharmacist how to take the medication safely.  Take Eliquis exactly as prescribed by your doctor and DO NOT stop taking Eliquis without talking to the doctor who prescribed the medication.  Stopping may increase your risk of developing a stroke.  Refill your prescription before you run out.  After discharge, you should have regular check-up appointments with your healthcare provider that is prescribing your Eliquis.  In the future your dose may need to  be changed if your kidney function or weight changes by a significant amount or as you get older.  What do you do if you miss a dose? If you miss a dose, take it as soon as you remember on the same day and resume taking twice daily.  Do not take more than one dose of ELIQUIS at the same time to make up a missed dose.  Important Safety Information A possible side effect of Eliquis is bleeding. You should call your healthcare provider right away if you experience any of the following: Bleeding from an injury or your nose that does not stop. Unusual colored urine (red or dark brown) or unusual colored stools (red or black). Unusual bruising for unknown reasons. A serious fall or if you hit your head (even if there is no bleeding).  Some medicines may interact with Eliquis and might increase your risk of bleeding or clotting while on Eliquis. To help avoid this, consult your healthcare provider or pharmacist prior to using any new prescription or non-prescription medications, including herbals, vitamins, non-steroidal anti-inflammatory drugs (NSAIDs) and supplements.  This website has more information on Eliquis (apixaban): http://www.eliquis.com/eliquis/home

## 2023-09-24 ENCOUNTER — Other Ambulatory Visit (HOSPITAL_COMMUNITY): Payer: Self-pay | Admitting: Internal Medicine

## 2023-09-24 DIAGNOSIS — E876 Hypokalemia: Secondary | ICD-10-CM | POA: Diagnosis not present

## 2023-09-24 DIAGNOSIS — I4891 Unspecified atrial fibrillation: Secondary | ICD-10-CM

## 2023-09-29 ENCOUNTER — Ambulatory Visit (HOSPITAL_COMMUNITY)
Admission: RE | Admit: 2023-09-29 | Discharge: 2023-09-29 | Disposition: A | Discharge status: Home / Self Care | Source: Ambulatory Visit | Attending: Internal Medicine | Admitting: Internal Medicine

## 2023-09-29 VITALS — BP 130/70 | HR 69 | Ht 63.0 in | Wt 221.8 lb

## 2023-09-29 DIAGNOSIS — M25661 Stiffness of right knee, not elsewhere classified: Secondary | ICD-10-CM | POA: Diagnosis not present

## 2023-09-29 DIAGNOSIS — I480 Paroxysmal atrial fibrillation: Secondary | ICD-10-CM

## 2023-09-29 DIAGNOSIS — M6281 Muscle weakness (generalized): Secondary | ICD-10-CM | POA: Diagnosis not present

## 2023-09-29 DIAGNOSIS — M1711 Unilateral primary osteoarthritis, right knee: Secondary | ICD-10-CM | POA: Diagnosis not present

## 2023-09-29 DIAGNOSIS — R262 Difficulty in walking, not elsewhere classified: Secondary | ICD-10-CM | POA: Diagnosis not present

## 2023-09-29 DIAGNOSIS — D6869 Other thrombophilia: Secondary | ICD-10-CM

## 2023-09-29 DIAGNOSIS — I4891 Unspecified atrial fibrillation: Secondary | ICD-10-CM | POA: Diagnosis not present

## 2023-09-29 MED ORDER — APIXABAN 5 MG PO TABS: 5.0000 mg | ORAL_TABLET | Freq: Two times a day (BID) | ORAL | 2 refills | Status: DC

## 2023-09-29 NOTE — Progress Notes (Signed)
 Primary Care Physician: Merl Star, MD Primary Cardiologist: Oneil Bigness, MD Electrophysiologist: None     Referring Physician: ED     Florene Huntington is a 73 y.o. female with a history of CAD, HTN, HLD, carotid artery stenosis, chronic diastolic HF, OSA on CPAP, and atrial fibrillation who presents for consultation in the [facility]. ED visit on 09/22/23 for irregular heartbeat found to be in new onset Afib with RVR. She spontaneously converted to NSR while in ED. 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 Patient is on Eliquis  5 mg BID for a CHADS2VASC score of 4.  On evaluation today, she is currently in NSR. She has noted a few times the sensation of brief fluttering. She has missed a dose of Eliquis . She does not drink alcohol , and has little coffee. She does admit to frequently drinking tea.  Today, she denies symptoms of  chest pain, shortness of breath, orthopnea, PND, lower extremity edema, dizziness, presyncope, syncope, snoring, daytime somnolence, bleeding, or neurologic sequela. The patient is tolerating medications without difficulties and is otherwise without complaint today.    Atrial Fibrillation Risk Factors:  she does have symptoms or diagnosis of sleep apnea. she is compliant with CPAP therapy.   she has a BMI of Body mass index is 39.29 kg/m.Aaron Aas Filed Weights   09/29/23 1400  Weight: 100.6 kg    Current Outpatient Medications  Medication Sig Dispense Refill   acetaminophen  (TYLENOL ) 650 MG CR tablet Take 1,300 mg by mouth at bedtime.     atenolol  (TENORMIN ) 100 MG tablet Take 1 tablet (100 mg total) by mouth every evening.     baclofen  (LIORESAL ) 10 MG tablet Take 1 tablet (10 mg total) by mouth 3 (three) times daily. As needed for muscle spasm 50 tablet 0   cholecalciferol  (VITAMIN D ) 1000 UNITS tablet Take 2,000 Units by mouth daily.      dapagliflozin  propanediol (FARXIGA ) 10 MG TABS tablet Take 1 tablet (10 mg total) by mouth daily  before breakfast. 90 tablet 3   diltiazem  (CARDIZEM ) 30 MG tablet Take 1 tablet (30 mg total) by mouth daily as needed for up to 30 doses (Palpitations). 30 tablet 0   levothyroxine  (SYNTHROID , LEVOTHROID) 100 MCG tablet Take 100 mcg by mouth daily before breakfast.     loratadine  (CLARITIN ) 10 MG tablet 1 tablet Orally daily seasonally for 30 days As needed     meclizine (ANTIVERT) 25 MG tablet Take 25 mg by mouth as needed.     metFORMIN  (GLUCOPHAGE ) 500 MG tablet Take 500 mg by mouth at bedtime.     Multiple Vitamin (MULTIVITAMINS PO) Take 1 tablet by mouth every morning.     Multiple Vitamins-Minerals (MULTIVITAMIN WITH MINERALS) tablet Take 1 tablet by mouth at bedtime.      Omega-3 1000 MG CAPS Take 2,000 mg by mouth daily.     OZEMPIC, 1 MG/DOSE, 4 MG/3ML SOPN Inject 2 mg into the skin once a week.     potassium chloride  SA (KLOR-CON  M20) 20 MEQ tablet 1 tablet as needed Orally Once a day when taking furosemide  As needed     rosuvastatin  (CRESTOR ) 40 MG tablet Take 40 mg by mouth at bedtime.     apixaban  (ELIQUIS ) 5 MG TABS tablet Take 1 tablet (5 mg total) by mouth 2 (two) times daily. 60 tablet 2   No current facility-administered medications for this encounter.    Atrial Fibrillation Management history:  Previous antiarrhythmic drugs: none Previous cardioversions:  none Previous ablations: none Anticoagulation history: Eliquis  5 mg BID   ROS- All systems are reviewed and negative except as per the HPI above.  Physical Exam: BP 130/70   Pulse 69   Ht 5\' 3"  (1.6 m)   Wt 100.6 kg   BMI 39.29 kg/m   GEN: Well nourished, well developed in no acute distress NECK: No JVD; No carotid bruits CARDIAC: Regular rate and rhythm, no murmurs, rubs, gallops RESPIRATORY:  Clear to auscultation without rales, wheezing or rhonchi  ABDOMEN: Soft, non-tender, non-distended EXTREMITIES:  No edema; No deformity   EKG today demonstrates  Vent. rate 69 BPM PR interval 220 ms QRS  duration 100 ms QT/QTcB 410/439 ms P-R-T axes 47 18 58 Sinus rhythm with 1st degree A-V block Incomplete right bundle branch block Borderline ECG When compared with ECG of 22-Sep-2023 20:21, PREVIOUS ECG IS PRESENT  Echo 05/17/21 demonstrated   1. Left ventricular ejection fraction, by estimation, is 60 to 65%. The  left ventricle has normal function. The left ventricle has no regional  wall motion abnormalities. Left ventricular diastolic parameters were  normal.   2. Right ventricular systolic function is normal. The right ventricular  size is normal.   3. Left atrial size was moderately dilated.   4. The mitral valve is abnormal. Mild mitral valve regurgitation. No  evidence of mitral stenosis.   5. The aortic valve is tricuspid. There is mild calcification of the  aortic valve. Aortic valve regurgitation is not visualized. Aortic valve  sclerosis is present, with no evidence of aortic valve stenosis.   6. The inferior vena cava is normal in size with greater than 50%  respiratory variability, suggesting right atrial pressure of 3 mmHg.   ASSESSMENT & PLAN CHA2DS2-VASc Score = 5  The patient's score is based upon: CHF History: 1 HTN History: 1 Diabetes History: 0 Stroke History: 0 Vascular Disease History: 1 Age Score: 1 Gender Score: 1       ASSESSMENT AND PLAN: Paroxysmal Atrial Fibrillation (ICD10:  I48.0) The patient's CHA2DS2-VASc score is 5, indicating a 7.2% annual risk of stroke.    She is currently in NSR. Continue atenolol  100 mg daily. We did discuss the consideration of transitioning to metoprolol in the future should she have increased palpitations. Education provided about Afib. Discussion about triggers for Afib and compliance with CPAP. After discussion, we will proceed with conservative observation at this time. Rhythm monitoring device upgrade recommended as she has an Apple watch SE; can also consider Kardimobile device. She has repeat echo scheduled for  7/8.   Secondary Hypercoagulable State (ICD10:  D68.69) The patient is at significant risk for stroke/thromboembolism based upon her CHA2DS2-VASc Score of 5.  Continue Apixaban  (Eliquis ).  Continue Eliquis  5 mg BID without interruption. We discussed risks vs benefits of anticoagulation and patient understands the risks and wishes to continue stroke prevention. Will give CBC order to draw in 1 month.     Follow up 3 months Afib clinic.    Minnie Amber, PA-C  [facility] [address] [place], [place] [address] [phone]

## 2023-09-29 NOTE — Patient Instructions (Signed)
 CBC lab in 1 month at any [facility] attached lab slip   Kardia mobile 1 lead   Apple watch 4 series or newer

## 2023-10-01 DIAGNOSIS — M25661 Stiffness of right knee, not elsewhere classified: Secondary | ICD-10-CM | POA: Diagnosis not present

## 2023-10-01 DIAGNOSIS — R262 Difficulty in walking, not elsewhere classified: Secondary | ICD-10-CM | POA: Diagnosis not present

## 2023-10-01 DIAGNOSIS — M6281 Muscle weakness (generalized): Secondary | ICD-10-CM | POA: Diagnosis not present

## 2023-10-01 DIAGNOSIS — M1711 Unilateral primary osteoarthritis, right knee: Secondary | ICD-10-CM | POA: Diagnosis not present

## 2023-10-01 NOTE — Progress Notes (Signed)
 BNP ordered to assess for heart failure

## 2023-10-14 ENCOUNTER — Telehealth: Payer: Self-pay | Admitting: Cardiovascular Disease

## 2023-10-14 DIAGNOSIS — M1711 Unilateral primary osteoarthritis, right knee: Secondary | ICD-10-CM | POA: Diagnosis not present

## 2023-10-14 DIAGNOSIS — I4891 Unspecified atrial fibrillation: Secondary | ICD-10-CM

## 2023-10-14 DIAGNOSIS — G4710 Hypersomnia, unspecified: Secondary | ICD-10-CM | POA: Diagnosis not present

## 2023-10-14 DIAGNOSIS — M5432 Sciatica, left side: Secondary | ICD-10-CM | POA: Diagnosis not present

## 2023-10-14 DIAGNOSIS — I10 Essential (primary) hypertension: Secondary | ICD-10-CM | POA: Diagnosis not present

## 2023-10-14 DIAGNOSIS — G4733 Obstructive sleep apnea (adult) (pediatric): Secondary | ICD-10-CM | POA: Diagnosis not present

## 2023-10-14 MED ORDER — APIXABAN 5 MG PO TABS
5.0000 mg | ORAL_TABLET | Freq: Two times a day (BID) | ORAL | 1 refills | Status: AC
Start: 2023-10-14 — End: ?

## 2023-10-14 NOTE — Telephone Encounter (Signed)
*  STAT* If patient is at the pharmacy, call can be transferred to refill team.   1. Which medications need to be refilled? (please list name of each medication and dose if known) apixaban (ELIQUIS) 5 MG TABS tablet  2. Which pharmacy/location (including street and city if local pharmacy) is medication to be sent to? [id number] - [place], [place] - [address]  3. Do they need a 30 day or 90 day supply? 90  

## 2023-10-14 NOTE — Telephone Encounter (Signed)
 Prescription refill request for Eliquis  received. Indication: Afib Last office visit: 09/29/23 Sam)  Scr: 0.68 (09/22/23)  Age: 73 Weight: 100.6kg Appropriate dose. Refill sent.

## 2023-10-15 DIAGNOSIS — M6281 Muscle weakness (generalized): Secondary | ICD-10-CM | POA: Diagnosis not present

## 2023-10-15 DIAGNOSIS — M25661 Stiffness of right knee, not elsewhere classified: Secondary | ICD-10-CM | POA: Diagnosis not present

## 2023-10-15 DIAGNOSIS — M1711 Unilateral primary osteoarthritis, right knee: Secondary | ICD-10-CM | POA: Diagnosis not present

## 2023-10-15 DIAGNOSIS — R262 Difficulty in walking, not elsewhere classified: Secondary | ICD-10-CM | POA: Diagnosis not present

## 2023-10-20 DIAGNOSIS — M1711 Unilateral primary osteoarthritis, right knee: Secondary | ICD-10-CM | POA: Diagnosis not present

## 2023-10-20 DIAGNOSIS — M25661 Stiffness of right knee, not elsewhere classified: Secondary | ICD-10-CM | POA: Diagnosis not present

## 2023-10-20 DIAGNOSIS — R262 Difficulty in walking, not elsewhere classified: Secondary | ICD-10-CM | POA: Diagnosis not present

## 2023-10-20 DIAGNOSIS — M6281 Muscle weakness (generalized): Secondary | ICD-10-CM | POA: Diagnosis not present

## 2023-10-27 ENCOUNTER — Ambulatory Visit (HOSPITAL_BASED_OUTPATIENT_CLINIC_OR_DEPARTMENT_OTHER)

## 2023-10-27 DIAGNOSIS — I4891 Unspecified atrial fibrillation: Secondary | ICD-10-CM

## 2023-10-27 LAB — ECHOCARDIOGRAM COMPLETE
Area-P 1/2: 2.8 cm2
S' Lateral: 2.95 cm

## 2023-10-28 ENCOUNTER — Ambulatory Visit (HOSPITAL_COMMUNITY): Payer: Self-pay | Admitting: Internal Medicine

## 2023-10-28 LAB — CBC
Hematocrit: 45.1 % (ref 34.0–46.6)
Hemoglobin: 14.5 g/dL (ref 11.1–15.9)
MCH: 27.9 pg (ref 26.6–33.0)
MCHC: 32.2 g/dL (ref 31.5–35.7)
MCV: 87 fL (ref 79–97)
Platelets: 306 x10E3/uL (ref 150–450)
RBC: 5.2 x10E6/uL (ref 3.77–5.28)
RDW: 13.4 % (ref 11.7–15.4)
WBC: 6.4 x10E3/uL (ref 3.4–10.8)

## 2023-11-11 DIAGNOSIS — M1711 Unilateral primary osteoarthritis, right knee: Secondary | ICD-10-CM | POA: Diagnosis not present

## 2023-11-11 DIAGNOSIS — M7541 Impingement syndrome of right shoulder: Secondary | ICD-10-CM | POA: Diagnosis not present

## 2023-11-19 DIAGNOSIS — H906 Mixed conductive and sensorineural hearing loss, bilateral: Secondary | ICD-10-CM | POA: Diagnosis not present

## 2023-11-19 DIAGNOSIS — H7292 Unspecified perforation of tympanic membrane, left ear: Secondary | ICD-10-CM | POA: Diagnosis not present

## 2023-12-18 DIAGNOSIS — H02834 Dermatochalasis of left upper eyelid: Secondary | ICD-10-CM | POA: Diagnosis not present

## 2023-12-18 DIAGNOSIS — H02423 Myogenic ptosis of bilateral eyelids: Secondary | ICD-10-CM | POA: Diagnosis not present

## 2023-12-18 DIAGNOSIS — H02831 Dermatochalasis of right upper eyelid: Secondary | ICD-10-CM | POA: Diagnosis not present

## 2023-12-18 DIAGNOSIS — Z961 Presence of intraocular lens: Secondary | ICD-10-CM | POA: Diagnosis not present

## 2023-12-30 ENCOUNTER — Encounter (HOSPITAL_COMMUNITY): Payer: Self-pay | Admitting: Internal Medicine

## 2023-12-30 ENCOUNTER — Ambulatory Visit (HOSPITAL_COMMUNITY)
Admission: RE | Admit: 2023-12-30 | Discharge: 2023-12-30 | Disposition: A | Discharge status: Home / Self Care | Source: Ambulatory Visit | Attending: Internal Medicine | Admitting: Internal Medicine

## 2023-12-30 VITALS — BP 132/80 | HR 71 | Ht 63.0 in | Wt 204.6 lb

## 2023-12-30 DIAGNOSIS — D6869 Other thrombophilia: Secondary | ICD-10-CM | POA: Diagnosis not present

## 2023-12-30 DIAGNOSIS — I480 Paroxysmal atrial fibrillation: Secondary | ICD-10-CM | POA: Diagnosis not present

## 2023-12-30 NOTE — Progress Notes (Signed)
 Primary Care Physician: Rexanne Ingle, MD Primary Cardiologist: Darryle ONEIDA Decent, MD Electrophysiologist: None     Referring Physician: ED     Kathryn Castillo is a 73 y.o. female with a history of CAD, HTN, HLD, carotid artery stenosis, chronic diastolic HF, OSA on CPAP, and atrial fibrillation who presents for consultation in the [facility]. ED visit on 09/22/23 for irregular heartbeat found to be in new onset Afib with RVR. She spontaneously converted to NSR while in ED. 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 Patient is on Eliquis  5 mg BID for a CHADS2VASC score of 4.  On evaluation today, she is currently in NSR. She has noted a few times the sensation of brief fluttering. She has missed a dose of Eliquis . She does not drink alcohol , and has little coffee. She does admit to frequently drinking tea.  On follow up 12/30/23, patient is currently in NSR. She has had overall very low Afib burden since last office visit; she has taken diltiazem  PRN once for an Afib episode and it resolved. No bleeding issues on Eliquis .   Today, she denies symptoms of  chest pain, shortness of breath, orthopnea, PND, lower extremity edema, dizziness, presyncope, syncope, snoring, daytime somnolence, bleeding, or neurologic sequela. The patient is tolerating medications without difficulties and is otherwise without complaint today.    Atrial Fibrillation Risk Factors:  she does have symptoms or diagnosis of sleep apnea. she is compliant with CPAP therapy.   she has a BMI of Body mass index is 36.24 kg/m.Kathryn Castillo Filed Weights   12/30/23 1455  Weight: 92.8 kg     Current Outpatient Medications  Medication Sig Dispense Refill   acetaminophen  (TYLENOL ) 650 MG CR tablet Take 1,300 mg by mouth at bedtime.     apixaban  (ELIQUIS ) 5 MG TABS tablet Take 1 tablet (5 mg total) by mouth 2 (two) times daily. 180 tablet 1   atenolol  (TENORMIN ) 100 MG tablet Take 1 tablet (100 mg total) by mouth every  evening.     baclofen  (LIORESAL ) 10 MG tablet Take 1 tablet (10 mg total) by mouth 3 (three) times daily. As needed for muscle spasm 50 tablet 0   cholecalciferol  (VITAMIN D ) 1000 UNITS tablet Take 2,000 Units by mouth daily.      dapagliflozin  propanediol (FARXIGA ) 10 MG TABS tablet Take 1 tablet (10 mg total) by mouth daily before breakfast. 90 tablet 3   diltiazem  (CARDIZEM ) 30 MG tablet Take 1 tablet (30 mg total) by mouth daily as needed for up to 30 doses (Palpitations). 30 tablet 0   levothyroxine  (SYNTHROID , LEVOTHROID) 100 MCG tablet Take 100 mcg by mouth daily before breakfast.     loratadine  (CLARITIN ) 10 MG tablet 1 tablet Orally daily seasonally for 30 days As needed     meclizine (ANTIVERT) 25 MG tablet Take 25 mg by mouth as needed.     metFORMIN  (GLUCOPHAGE ) 500 MG tablet Take 500 mg by mouth at bedtime.     Multiple Vitamin (MULTIVITAMINS PO) Take 1 tablet by mouth every morning.     Multiple Vitamins-Minerals (MULTIVITAMIN WITH MINERALS) tablet Take 1 tablet by mouth at bedtime.      Omega-3 1000 MG CAPS Take 2,000 mg by mouth daily.     OZEMPIC, 1 MG/DOSE, 4 MG/3ML SOPN Inject 2 mg into the skin once a week.     potassium chloride  SA (KLOR-CON  M20) 20 MEQ tablet 1 tablet as needed Orally Once a day when taking furosemide  As  needed     rosuvastatin  (CRESTOR ) 40 MG tablet Take 40 mg by mouth at bedtime.     No current facility-administered medications for this encounter.    Atrial Fibrillation Management history:  Previous antiarrhythmic drugs: none Previous cardioversions: none Previous ablations: none Anticoagulation history: Eliquis  5 mg BID   ROS- All systems are reviewed and negative except as per the HPI above.  Physical Exam: BP 132/80   Pulse 71   Ht 5' 3 (1.6 m)   Wt 92.8 kg   BMI 36.24 kg/m   GEN- The patient is well appearing, alert and oriented x 3 today.   Neck - no JVD or carotid bruit noted Lungs- Clear to ausculation bilaterally, normal work  of breathing Heart- Regular rate and rhythm, no murmurs, rubs or gallops, PMI not laterally displaced Extremities- no clubbing, cyanosis, or edema Skin - no rash or ecchymosis noted   EKG today demonstrates  Vent. rate 71 BPM PR interval 192 ms QRS duration 92 ms QT/QTcB 384/417 ms P-R-T axes 43 6 36 Normal sinus rhythm Incomplete right bundle branch block Minimal voltage criteria for LVH, may be normal variant ( R in aVL ) Cannot rule out Anterior infarct , age undetermined Abnormal ECG When compared with ECG of 29-Sep-2023 14:12, No significant change was found  Echo 10/27/23: 1. Left ventricular ejection fraction, by estimation, is 55 to 60%. The  left ventricle has normal function. Left ventricular endocardial border  not optimally defined to evaluate regional wall motion. There is moderate  concentric left ventricular  hypertrophy. Left ventricular diastolic parameters are indeterminate. The  average left ventricular global longitudinal strain is -15.7 %. The global  longitudinal strain is abnormal.   2. Right ventricular systolic function is normal. The right ventricular  size is normal.   3. The mitral valve is degenerative. Trivial mitral valve regurgitation.  No evidence of mitral stenosis.   4. The aortic valve is tricuspid. Aortic valve regurgitation is not  visualized. Aortic valve sclerosis/calcification is present, without any  evidence of aortic stenosis.   5. The inferior vena cava is normal in size with greater than 50%  respiratory variability, suggesting right atrial pressure of 3 mmHg.   6. The inferior wall is not well visualized. Recommend repeat study with  definity  contrast to rule out wall motion abnormality.   ASSESSMENT & PLAN CHA2DS2-VASc Score = 5  The patient's score is based upon: CHF History: 1 HTN History: 1 Diabetes History: 0 Stroke History: 0 Vascular Disease History: 1 Age Score: 1 Gender Score: 1       ASSESSMENT AND  PLAN: Paroxysmal Atrial Fibrillation (ICD10:  I48.0) The patient's CHA2DS2-VASc score is 5, indicating a 7.2% annual risk of stroke.    Patient is currently in NSR. She appears to be maintaining normal rhythm and is doing well. Continue atenolol  100 mg daily. Continue diltiazem  PRN.    Secondary Hypercoagulable State (ICD10:  D68.69) The patient is at significant risk for stroke/thromboembolism based upon her CHA2DS2-VASc Score of 5.  Continue Apixaban  (Eliquis ).  Continue Eliquis  5 mg BID.     Follow up 6 months Afib clinic.    Terra Pac, PA-C  [facility] [address] [place], [place] [address] [phone]

## 2024-02-01 ENCOUNTER — Other Ambulatory Visit: Payer: Self-pay | Admitting: Internal Medicine

## 2024-02-01 DIAGNOSIS — Z1231 Encounter for screening mammogram for malignant neoplasm of breast: Secondary | ICD-10-CM

## 2024-02-10 DIAGNOSIS — G4710 Hypersomnia, unspecified: Secondary | ICD-10-CM | POA: Diagnosis not present

## 2024-02-10 DIAGNOSIS — I10 Essential (primary) hypertension: Secondary | ICD-10-CM | POA: Diagnosis not present

## 2024-02-19 DIAGNOSIS — M75121 Complete rotator cuff tear or rupture of right shoulder, not specified as traumatic: Secondary | ICD-10-CM | POA: Diagnosis not present

## 2024-02-19 DIAGNOSIS — Z96651 Presence of right artificial knee joint: Secondary | ICD-10-CM | POA: Diagnosis not present

## 2024-03-03 DIAGNOSIS — I771 Stricture of artery: Secondary | ICD-10-CM | POA: Diagnosis not present

## 2024-03-03 DIAGNOSIS — E1169 Type 2 diabetes mellitus with other specified complication: Secondary | ICD-10-CM | POA: Diagnosis not present

## 2024-03-03 DIAGNOSIS — I4891 Unspecified atrial fibrillation: Secondary | ICD-10-CM | POA: Diagnosis not present

## 2024-03-03 DIAGNOSIS — E7800 Pure hypercholesterolemia, unspecified: Secondary | ICD-10-CM | POA: Diagnosis not present

## 2024-03-03 DIAGNOSIS — Z23 Encounter for immunization: Secondary | ICD-10-CM | POA: Diagnosis not present

## 2024-03-03 DIAGNOSIS — G4733 Obstructive sleep apnea (adult) (pediatric): Secondary | ICD-10-CM | POA: Diagnosis not present

## 2024-03-03 DIAGNOSIS — Z1331 Encounter for screening for depression: Secondary | ICD-10-CM | POA: Diagnosis not present

## 2024-03-03 DIAGNOSIS — I2510 Atherosclerotic heart disease of native coronary artery without angina pectoris: Secondary | ICD-10-CM | POA: Diagnosis not present

## 2024-03-03 DIAGNOSIS — E039 Hypothyroidism, unspecified: Secondary | ICD-10-CM | POA: Diagnosis not present

## 2024-03-03 DIAGNOSIS — Z0000 Encounter for general adult medical examination without abnormal findings: Secondary | ICD-10-CM | POA: Diagnosis not present

## 2024-03-03 DIAGNOSIS — I10 Essential (primary) hypertension: Secondary | ICD-10-CM | POA: Diagnosis not present

## 2024-04-08 ENCOUNTER — Ambulatory Visit

## 2024-04-19 ENCOUNTER — Ambulatory Visit

## 2024-04-19 ENCOUNTER — Encounter: Payer: Self-pay | Admitting: Internal Medicine

## 2024-04-19 ENCOUNTER — Ambulatory Visit
Admission: RE | Admit: 2024-04-19 | Discharge: 2024-04-19 | Disposition: A | Discharge status: Home / Self Care | Source: Ambulatory Visit | Attending: Internal Medicine | Admitting: Internal Medicine

## 2024-04-19 DIAGNOSIS — Z1231 Encounter for screening mammogram for malignant neoplasm of breast: Secondary | ICD-10-CM

## 2024-04-28 ENCOUNTER — Encounter: Payer: Self-pay | Admitting: Cardiovascular Disease

## 2024-05-17 ENCOUNTER — Ambulatory Visit: Admitting: Nurse Practitioner

## 2024-06-29 ENCOUNTER — Ambulatory Visit (HOSPITAL_COMMUNITY): Admitting: Internal Medicine

## 2024-07-06 ENCOUNTER — Ambulatory Visit: Admitting: Nurse Practitioner
